# Patient Record
Sex: Female | Born: 1940 | Race: White | Hispanic: No | Marital: Single | Smoking: Former smoker
Health system: Southern US, Community
[De-identification: ages and names within clinical notes are randomized; demographics above are authoritative.]

## PROBLEM LIST (undated history)

## (undated) DIAGNOSIS — G500 Trigeminal neuralgia: Secondary | ICD-10-CM

## (undated) DIAGNOSIS — J45909 Unspecified asthma, uncomplicated: Secondary | ICD-10-CM

## (undated) DIAGNOSIS — E039 Hypothyroidism, unspecified: Secondary | ICD-10-CM

## (undated) DIAGNOSIS — M8580 Other specified disorders of bone density and structure, unspecified site: Secondary | ICD-10-CM

## (undated) DIAGNOSIS — K5790 Diverticulosis of intestine, part unspecified, without perforation or abscess without bleeding: Secondary | ICD-10-CM

## (undated) DIAGNOSIS — D509 Iron deficiency anemia, unspecified: Secondary | ICD-10-CM

## (undated) DIAGNOSIS — Z5189 Encounter for other specified aftercare: Secondary | ICD-10-CM

## (undated) DIAGNOSIS — G4700 Insomnia, unspecified: Secondary | ICD-10-CM

## (undated) DIAGNOSIS — M1990 Unspecified osteoarthritis, unspecified site: Secondary | ICD-10-CM

## (undated) DIAGNOSIS — Z8719 Personal history of other diseases of the digestive system: Secondary | ICD-10-CM

## (undated) DIAGNOSIS — J189 Pneumonia, unspecified organism: Secondary | ICD-10-CM

## (undated) DIAGNOSIS — I10 Essential (primary) hypertension: Secondary | ICD-10-CM

## (undated) DIAGNOSIS — Z86010 Personal history of colonic polyps: Secondary | ICD-10-CM

## (undated) DIAGNOSIS — F988 Other specified behavioral and emotional disorders with onset usually occurring in childhood and adolescence: Secondary | ICD-10-CM

## (undated) DIAGNOSIS — K219 Gastro-esophageal reflux disease without esophagitis: Secondary | ICD-10-CM

## (undated) DIAGNOSIS — F329 Major depressive disorder, single episode, unspecified: Secondary | ICD-10-CM

## (undated) DIAGNOSIS — F32A Depression, unspecified: Secondary | ICD-10-CM

## (undated) HISTORY — DX: Iron deficiency anemia, unspecified: D50.9

## (undated) HISTORY — DX: Unspecified osteoarthritis, unspecified site: M19.90

## (undated) HISTORY — DX: Essential (primary) hypertension: I10

## (undated) HISTORY — DX: Personal history of colonic polyps: Z86.010

## (undated) HISTORY — PX: COLONOSCOPY: SHX174

## (undated) HISTORY — DX: Trigeminal neuralgia: G50.0

## (undated) HISTORY — DX: Personal history of other diseases of the digestive system: Z87.19

## (undated) HISTORY — DX: Encounter for other specified aftercare: Z51.89

## (undated) HISTORY — DX: Other specified behavioral and emotional disorders with onset usually occurring in childhood and adolescence: F98.8

## (undated) HISTORY — DX: Depression, unspecified: F32.A

## (undated) HISTORY — PX: VAGINAL HYSTERECTOMY: SHX2639

## (undated) HISTORY — DX: Major depressive disorder, single episode, unspecified: F32.9

## (undated) HISTORY — DX: Hypothyroidism, unspecified: E03.9

## (undated) HISTORY — PX: ABDOMINAL HYSTERECTOMY: SHX81

## (undated) HISTORY — PX: BILATERAL SALPINGOOPHORECTOMY: SHX1223

## (undated) HISTORY — DX: Other specified disorders of bone density and structure, unspecified site: M85.80

## (undated) HISTORY — DX: Gastro-esophageal reflux disease without esophagitis: K21.9

## (undated) HISTORY — DX: Insomnia, unspecified: G47.00

## (undated) HISTORY — DX: Unspecified asthma, uncomplicated: J45.909

## (undated) HISTORY — PX: UPPER GASTROINTESTINAL ENDOSCOPY: SHX188

## (undated) HISTORY — DX: Diverticulosis of intestine, part unspecified, without perforation or abscess without bleeding: K57.90

---

## 1999-12-11 ENCOUNTER — Encounter: Payer: Self-pay | Admitting: Family Medicine

## 1999-12-11 ENCOUNTER — Encounter: Admission: RE | Admit: 1999-12-11 | Discharge: 1999-12-11 | Payer: Self-pay | Admitting: Family Medicine

## 2000-06-15 ENCOUNTER — Encounter: Admission: RE | Admit: 2000-06-15 | Discharge: 2000-06-15 | Payer: Self-pay | Admitting: Family Medicine

## 2000-06-15 ENCOUNTER — Encounter: Payer: Self-pay | Admitting: Family Medicine

## 2001-04-23 ENCOUNTER — Encounter: Admission: RE | Admit: 2001-04-23 | Discharge: 2001-04-23 | Payer: Self-pay | Admitting: Family Medicine

## 2001-04-23 ENCOUNTER — Encounter: Payer: Self-pay | Admitting: Family Medicine

## 2002-04-25 ENCOUNTER — Encounter: Admission: RE | Admit: 2002-04-25 | Discharge: 2002-04-25 | Payer: Self-pay | Admitting: Family Medicine

## 2002-04-25 ENCOUNTER — Encounter: Payer: Self-pay | Admitting: Family Medicine

## 2003-10-30 ENCOUNTER — Encounter: Payer: Self-pay | Admitting: Internal Medicine

## 2006-04-20 ENCOUNTER — Encounter: Payer: Self-pay | Admitting: Internal Medicine

## 2006-04-21 ENCOUNTER — Encounter: Payer: Self-pay | Admitting: Internal Medicine

## 2006-05-13 ENCOUNTER — Encounter: Payer: Self-pay | Admitting: Internal Medicine

## 2007-04-22 ENCOUNTER — Encounter: Payer: Self-pay | Admitting: Internal Medicine

## 2007-05-04 ENCOUNTER — Encounter: Admission: RE | Admit: 2007-05-04 | Discharge: 2007-05-04 | Payer: Self-pay | Admitting: Family Medicine

## 2007-09-03 ENCOUNTER — Encounter: Payer: Self-pay | Admitting: Internal Medicine

## 2007-09-22 ENCOUNTER — Encounter: Payer: Self-pay | Admitting: Internal Medicine

## 2008-03-07 ENCOUNTER — Encounter: Admission: RE | Admit: 2008-03-07 | Discharge: 2008-03-07 | Payer: Self-pay | Admitting: Family Medicine

## 2008-03-16 ENCOUNTER — Encounter: Payer: Self-pay | Admitting: Internal Medicine

## 2008-03-28 ENCOUNTER — Encounter: Payer: Self-pay | Admitting: Internal Medicine

## 2008-03-30 ENCOUNTER — Encounter: Payer: Self-pay | Admitting: Internal Medicine

## 2008-04-11 ENCOUNTER — Encounter: Payer: Self-pay | Admitting: Internal Medicine

## 2008-05-01 ENCOUNTER — Encounter: Payer: Self-pay | Admitting: Internal Medicine

## 2008-07-11 ENCOUNTER — Encounter: Payer: Self-pay | Admitting: Internal Medicine

## 2008-09-12 ENCOUNTER — Encounter: Payer: Self-pay | Admitting: Internal Medicine

## 2009-01-19 ENCOUNTER — Encounter: Payer: Self-pay | Admitting: Internal Medicine

## 2009-01-22 ENCOUNTER — Encounter: Payer: Self-pay | Admitting: Internal Medicine

## 2009-02-05 ENCOUNTER — Encounter: Payer: Self-pay | Admitting: Internal Medicine

## 2009-02-26 ENCOUNTER — Encounter: Payer: Self-pay | Admitting: Internal Medicine

## 2009-07-11 ENCOUNTER — Encounter: Admission: RE | Admit: 2009-07-11 | Discharge: 2009-07-11 | Payer: Self-pay | Admitting: Orthopedic Surgery

## 2009-07-18 HISTORY — PX: OTHER SURGICAL HISTORY: SHX169

## 2009-10-22 ENCOUNTER — Encounter: Admission: RE | Admit: 2009-10-22 | Discharge: 2009-10-22 | Payer: Self-pay | Admitting: Family Medicine

## 2009-11-06 ENCOUNTER — Encounter: Payer: Self-pay | Admitting: Internal Medicine

## 2009-11-14 ENCOUNTER — Encounter (INDEPENDENT_AMBULATORY_CARE_PROVIDER_SITE_OTHER): Payer: Self-pay | Admitting: *Deleted

## 2009-12-10 ENCOUNTER — Encounter: Payer: Self-pay | Admitting: Internal Medicine

## 2009-12-13 ENCOUNTER — Encounter: Payer: Self-pay | Admitting: Internal Medicine

## 2009-12-17 ENCOUNTER — Ambulatory Visit: Payer: Self-pay | Admitting: Internal Medicine

## 2009-12-17 ENCOUNTER — Encounter (INDEPENDENT_AMBULATORY_CARE_PROVIDER_SITE_OTHER): Payer: Self-pay | Admitting: *Deleted

## 2009-12-17 DIAGNOSIS — D518 Other vitamin B12 deficiency anemias: Secondary | ICD-10-CM | POA: Insufficient documentation

## 2010-04-26 ENCOUNTER — Ambulatory Visit: Payer: Self-pay | Admitting: Internal Medicine

## 2010-04-26 DIAGNOSIS — I10 Essential (primary) hypertension: Secondary | ICD-10-CM | POA: Insufficient documentation

## 2010-04-26 DIAGNOSIS — E039 Hypothyroidism, unspecified: Secondary | ICD-10-CM | POA: Insufficient documentation

## 2010-04-26 DIAGNOSIS — D509 Iron deficiency anemia, unspecified: Secondary | ICD-10-CM | POA: Insufficient documentation

## 2010-04-26 LAB — CONVERTED CEMR LAB
Basophils Relative: 0.8 % (ref 0.0–3.0)
Chloride: 102 meq/L (ref 96–112)
Eosinophils Relative: 2.9 % (ref 0.0–5.0)
GFR calc non Af Amer: 51.65 mL/min (ref >60)
HCT: 37.4 % (ref 36.0–46.0)
Lymphs Abs: 1.4 10*3/uL (ref 0.7–4.0)
MCV: 91.4 fL (ref 78.0–100.0)
Monocytes Absolute: 0.3 10*3/uL (ref 0.1–1.0)
Monocytes Relative: 8.7 % (ref 3.0–12.0)
Potassium: 4.8 meq/L (ref 3.5–5.1)
RBC: 4.09 M/uL (ref 3.87–5.11)
Saturation Ratios: 22.8 % (ref 20.0–50.0)
Sodium: 141 meq/L (ref 135–145)
Transferrin: 307.6 mg/dL (ref 212.0–360.0)
WBC: 3.7 10*3/uL — ABNORMAL LOW (ref 4.5–10.5)

## 2010-04-28 ENCOUNTER — Telehealth: Payer: Self-pay | Admitting: Internal Medicine

## 2010-05-06 ENCOUNTER — Ambulatory Visit: Payer: Self-pay | Admitting: Internal Medicine

## 2010-06-06 ENCOUNTER — Ambulatory Visit: Payer: Self-pay | Admitting: Internal Medicine

## 2010-06-06 DIAGNOSIS — G500 Trigeminal neuralgia: Secondary | ICD-10-CM | POA: Insufficient documentation

## 2010-07-05 ENCOUNTER — Ambulatory Visit: Payer: Self-pay | Admitting: Internal Medicine

## 2010-08-05 ENCOUNTER — Ambulatory Visit: Payer: Self-pay | Admitting: Internal Medicine

## 2010-08-22 ENCOUNTER — Telehealth: Payer: Self-pay | Admitting: Internal Medicine

## 2010-09-05 ENCOUNTER — Ambulatory Visit: Payer: Self-pay | Admitting: Internal Medicine

## 2010-09-05 DIAGNOSIS — G4700 Insomnia, unspecified: Secondary | ICD-10-CM | POA: Insufficient documentation

## 2010-09-18 ENCOUNTER — Ambulatory Visit: Payer: Self-pay | Admitting: Internal Medicine

## 2010-09-18 ENCOUNTER — Telehealth (INDEPENDENT_AMBULATORY_CARE_PROVIDER_SITE_OTHER): Payer: Self-pay | Admitting: *Deleted

## 2010-09-18 DIAGNOSIS — N390 Urinary tract infection, site not specified: Secondary | ICD-10-CM | POA: Insufficient documentation

## 2010-09-18 LAB — CONVERTED CEMR LAB
Bilirubin Urine: NEGATIVE
Ketones, ur: NEGATIVE mg/dL
Nitrite: NEGATIVE
Specific Gravity, Urine: >=1.03 (ref 1.000–1.030)
pH: 5 (ref 5.0–8.0)

## 2010-10-30 ENCOUNTER — Telehealth (INDEPENDENT_AMBULATORY_CARE_PROVIDER_SITE_OTHER): Payer: Self-pay | Admitting: *Deleted

## 2010-12-08 ENCOUNTER — Encounter: Payer: Self-pay | Admitting: Family Medicine

## 2010-12-17 NOTE — Assessment & Plan Note (Signed)
Summary: UPPER GI BLEED--[facility]   History of Present Illness Visit Type: consult  Primary GI MD: Stan Head MD [facility] Primary Provider: Carolyne Fiscal, MD Requesting Provider: Carolyne Fiscal, MD Chief Complaint: Upper GI bleed  History of Present Illness:   This is a 70 year old white woman with to me from prior colonoscopy. I am asked to see her again regarding suspect is recurrent upper GI bleeding. She has been using ibuprofen intermittently for aches and pains and a myositis. Her primary care physician has noted that her hemoglobin would drop to 10.7 in the last few weeks, treatment with iron and avoiding end-stage and aspirin as allowed normalization of the hemoglobin to the 11 range. She will use omeprazole 20 mg b.i.d. p.r.n. as well. she had been living in [place], [place] a few years ago, and she was admitted with what sounds like an upper GI bleed in 2007. She was using a lot of ibuprofen and aspirin at that time, she reports that she was told she had a very eructating stomach that looked like a With scratching it. She says her hemoglobin was 3 or 4 at that time and there was a large hiatal hernia.  Colonoscopy 10/2003 diverticula and external hemrrhoids. She also had a colonoscopy in 2007 [place], [place]) - results not known.  [facility] [place] records, colonoscopy report, Dr. Geoffery Lyons records and labs from 2010 and 2011 reviewed.     GI Review of Systems      Denies abdominal pain, acid reflux, belching, bloating, chest pain, dysphagia with liquids, dysphagia with solids, heartburn, loss of appetite, nausea, vomiting, vomiting blood, weight loss, and  weight gain.        Denies anal fissure, black tarry stools, change in bowel habit, constipation, diarrhea, diverticulosis, fecal incontinence, heme positive stool, hemorrhoids, irritable bowel syndrome, jaundice, light color stool, liver problems, rectal bleeding, and  rectal pain.    Current Medications (verified): 1)   Lisinopril 10 Mg Tabs (Lisinopril) .... Take 1 Tablet By Mouth Once A Day in The Morning 2)  Maxzide-25 37.5-25 Mg Tabs (Triamterene-Hctz) .... Take 1 Tablet By Mouth Once A Day 3)  Synthroid 88 Mcg Tabs (Levothyroxine Sodium) .... Take 1 Tablet By Mouth Once A Day 4)  Ferrous Sulfate 325 (65 Fe) Mg Tabs (Ferrous Sulfate) .... Take 1 Tablet By Mouth Two Times A Day 5)  Calcium Carbonate 600 Mg Tabs (Calcium Carbonate) .... Take 2 Tablets Daily 6)  Vitamin D 2000 Unit Tabs (Cholecalciferol) .... Take 1 Tablet By Mouth Once A Day  Allergies (verified): 1)  Amlodipine Besylate (Amlodipine Besylate) 2)  Protonix (Pantoprazole Sodium) 3)  Wellbutrin 4)  Ace Inhibitors 5)  Levaquin  Past History:  Past Medical History: ADD Hypertension Hypothyroidism Anemia-Iron Deficiency - associaed with NSAID's Depression GERD Degenerative Disk Disease/Spinal Stenosis Osteopenia Diverticulosis Asthma  Past Surgical History: Hysterectomy-(LVH) BSO Right knee menscectomy 07/2009  Family History: Family History of Heart Disease: Father deceased age 43-MI No FH of Colon Cancer:  Social History: Divorced, 2 girls [employer] Patient is a former smoker. Quit in 1999 Alcohol Use - yes: 3 weekly  Daily Caffeine Use Illicit Drug Use - no Drug Use:  no  Review of Systems       + joint pains, arthritis All other ROS negative except as per HPI.   Vital Signs:  Patient profile:   70 year old female Height:      63 inches Weight:      149 pounds BMI:  26.49 BSA:     1.71 Pulse rate:   68 / minute Pulse rhythm:   regular BP sitting:   128 / 76  (left arm) Cuff size:   regular  Vitals Entered By: Ok Anis CMA (December 17, 2009 10:05 AM)  Physical Exam  General:  Well developed, well nourished, no acute distress. Eyes:  PERRLA, no icterus. Mouth:  No deformity or lesions, dentition normal. Neck:  Supple; no masses or thyromegaly. Lungs:  Clear throughout to  auscultation. Heart:  Regular rate and rhythm; no murmurs, rubs,  or bruits. Abdomen:  Soft, nontender and nondistended. No masses, hepatosplenomegaly or hernias noted. Normal bowel sounds. Extremities:  No clubbing, cyanosis, edema or deformities noted. Neurologic:  Alert and  oriented x4;   Cervical Nodes:  No significant cervical or supraclavicular adenopathy.  Psych:  Alert and cooperative. Normal to mildy manic mood and affect.   Impression & Recommendations:  Problem # 1:  GI BLEEDING (ICD-578.9) Assessment New Sounds like recurent low-grade GI bleeding due to NSAID's. Hgb normal now off NSAID's EGD to evaluate current status appropriate. Could need a colonoscopy but says had one 2007 in [place]. (2004 here external hemorrhoids and diverticuosis) Will try to obtain those records Risks, benefits,and indications of endoscopic procedure(s) were reviewed with the patient and all questions answered.  Other Orders: EGD (EGD)  Patient Instructions: 1)  Copy sent to : Mosetta Putt, MD 2)  EGD instructions provided to patient and she was asked to provide information re: which hospital in [place], [place] was she admitted to 2007?  Appended Document: UPPER GI BLEED--[facility] EGD, colonoscopy and path reports from 2007 reviewd. She cancelled EGD and should reschedule. o notthink colonoscopy needed right now would do EGD first  Appended Document: UPPER GI BLEED--[facility] I spoke with the patient this am , she is advised that Dr Leone Payor is requesting she reschedule her EGD she canceled.  Patient declined to scheudle with me this, but did say she will check her work schedule and call back

## 2010-12-17 NOTE — Letter (Signed)
Summary: EGD Instructions  [facility]  [address] [place], [place] [address]   Phone: [phone]  Fax: [phone]       [facility]    1941/05/31    MRN: [id number]       Procedure Day /Date: Tuesday, 01/08/10     Arrival Time: 2:00pm     Procedure Time: 3:00 pm     Location of Procedure:                    _X_ [facility] (4th Floor)  PREPARATION FOR ENDOSCOPY   On Tuesday, 01/08/10 THE DAY OF THE PROCEDURE:  1.   No solid foods, milk or milk products are allowed after midnight the night before your procedure.  2.   Do not drink anything colored red or purple.  Avoid juices with pulp.  No orange juice.  3.  You may drink clear liquids until 12:00 pm, which is 2 hours before your procedure.                                                                                                CLEAR LIQUIDS INCLUDE: Water Jello Ice Popsicles Tea (sugar ok, no milk/cream) Powdered fruit flavored drinks Coffee (sugar ok, no milk/cream) Gatorade Juice: apple, white grape, white cranberry  Lemonade Clear bullion, consomm, broth Carbonated beverages (any kind) Strained chicken noodle soup Hard Candy   MEDICATION INSTRUCTIONS  Unless otherwise instructed, you should take regular prescription medications with a small sip of water as early as possible the morning of your procedure.  Additional medication instructions: None                OTHER INSTRUCTIONS  You will need a responsible adult at least 68 years of age to accompany you and drive you home.   This person must remain in the waiting room during your procedure.  Wear loose fitting clothing that is easily removed.  Leave jewelry and other valuables at home.  However, you may wish to bring a book to read or an iPod/MP3 player to listen to music as you wait for your procedure to start.  Remove all body piercing jewelry and leave at home.  Total time from sign-in until discharge  is approximately 2-3 hours.  You should go home directly after your procedure and rest.  You can resume normal activities the day after your procedure.  The day of your procedure you should not:   Drive   Make legal decisions   Operate machinery   Drink alcohol   Return to work  You will receive specific instructions about eating, activities and medications before you leave.    The above instructions have been reviewed and explained to me by   _______________________    I fully understand and can verbalize these instructions _____________________________ Date _________

## 2010-12-17 NOTE — Assessment & Plan Note (Signed)
Summary: bp follow up/b12/ and med refill   Vital Signs:  Patient profile:   70 year old female Menstrual status:  hysterectomy Height:      63 inches Weight:      149 pounds BMI:     26.49 O2 Sat:      97 % on Room air Temp:     98.5 degrees F oral Pulse rate:   81 / minute Pulse rhythm:   regular Resp:     16 per minute BP sitting:   112 / 72  (left arm) Cuff size:   regular  Vitals Entered By: Rock Nephew CMA (September 05, 2010 1:56 PM)  Nutrition Counseling: Patient's BMI is greater than 25 and therefore counseled on weight management options.  O2 Flow:  Room air CC: follow-up visit, Insomnia, Hypertension Management Is Patient Diabetic? No Pain Assessment Patient in pain? no       Does patient need assistance? Functional Status Self care Ambulation Normal     Menstrual Status hysterectomy Last PAP Result Normal   Primary Care Provider:  Etta Grandchild MD  CC:  follow-up visit, Insomnia, and Hypertension Management.  History of Present Illness:  Insomnia      This is a 70 year old woman who presents with Insomnia.  The symptoms began 4-8 weeks ago.  The severity is described as mild.  The patient reports difficulty falling asleep and frequent awakening, but denies early awakening, nightmares, leg movements, snoring, apnea noted by partner, and daytime somnolence.  Insomnia has led to problems with impaired judgement.  Risk factors for insomnia include irregular work schedule.  Treatments tried in the past and found to be ineffective incude behavior modification, hypnosis, and relaxation techniques.    Also, she requests a refill on meds for ADHD.  Hypertension History:      She denies headache, chest pain, palpitations, dyspnea with exertion, orthopnea, PND, peripheral edema, visual symptoms, neurologic problems, syncope, and side effects from treatment.  She notes no problems with any antihypertensive medication side effects.        Positive major  cardiovascular risk factors include female age 68 years old or older and hypertension.  Negative major cardiovascular risk factors include no history of diabetes or hyperlipidemia and non-tobacco-user status.        Further assessment for target organ damage reveals no history of ASHD, cardiac end-organ damage (CHF/LVH), stroke/TIA, peripheral vascular disease, renal insufficiency, or hypertensive retinopathy.     Preventive Screening-Counseling & Management  Alcohol-Tobacco     Alcohol drinks/day: <1     Alcohol type: wine     >5/day in last 3 mos: no     Alcohol Counseling: not indicated; use of alcohol is not excessive or problematic     Feels need to cut down: no     Feels annoyed by complaints: no     Feels guilty re: drinking: yes     Needs 'eye opener' in am: no     Smoking Status: quit > 6 months     Smoking Cessation Counseling: yes     Tobacco Counseling: to remain off tobacco products  Hep-HIV-STD-Contraception     Hepatitis Risk: no risk noted     HIV Risk: no risk noted     STD Risk: no risk noted     SBE monthly: no     SBE Education/Counseling: to perform regular SBE  Allergies (verified): 1)  Amlodipine Besylate (Amlodipine Besylate) 2)  Protonix (Pantoprazole Sodium) 3)  Wellbutrin  4)  Ace Inhibitors 5)  Levaquin  Past History:  Past Medical History: Last updated: 04/26/2010 ADD Hypertension Hypothyroidism Anemia-Iron Deficiency - associaed with NSAID's Depression GERD Degenerative Disk Disease/Spinal Stenosis Osteopenia Diverticulosis Asthma  Past Surgical History: Last updated: 12/17/2009 Hysterectomy-(LVH) BSO Right knee menscectomy 07/2009  Family History: Last updated: 12/17/2009 Family History of Heart Disease: Father deceased age 66-MI No FH of Colon Cancer:  Social History: Last updated: 12/17/2009 Divorced, 2 girls [employer] Patient is a former smoker. Quit in 1999 Alcohol Use - yes: 3 weekly  Daily Caffeine Use Illicit Drug  Use - no  Risk Factors: Alcohol Use: <1 (09/05/2010) >5 drinks/d w/in last 3 months: no (09/05/2010) Exercise: yes (04/26/2010)  Risk Factors: Smoking Status: quit > 6 months (09/05/2010)  Family History: Reviewed history from 12/17/2009 and no changes required. Family History of Heart Disease: Father deceased age 66-MI No FH of Colon Cancer:  Social History: Reviewed history from 12/17/2009 and no changes required. Divorced, 2 girls [employer] Patient is a former smoker. Quit in 1999 Alcohol Use - yes: 3 weekly  Daily Caffeine Use Illicit Drug Use - no  Review of Systems General:  Denies chills, fatigue, fever, loss of appetite, malaise, and sweats. Psych:  Denies anxiety, depression, easily angered, easily tearful, irritability, mental problems, panic attacks, sense of great danger, suicidal thoughts/plans, thoughts of violence, unusual visions or sounds, and thoughts /plans of harming others. Endo:  Denies cold intolerance, excessive hunger, excessive thirst, excessive urination, heat intolerance, polyuria, and weight change. Heme:  Denies abnormal bruising, bleeding, enlarge lymph nodes, fevers, pallor, and skin discoloration.  Physical Exam  General:  Well developed, well nourished, no acute distress. Mouth:  No deformity or lesions, dentition normal. Neck:  Supple; no masses or thyromegaly. Lungs:  normal respiratory effort, no intercostal retractions, no accessory muscle use, normal breath sounds, no dullness, no fremitus, no crackles, and no wheezes.   Heart:  normal rate, regular rhythm, no murmur, no gallop, no rub, and no JVD.   Abdomen:  soft, non-tender, normal bowel sounds, no distention, no masses, no guarding, no rigidity, no rebound tenderness, no abdominal hernia, no inguinal hernia, no hepatomegaly, and no splenomegaly.   Msk:  normal ROM, no joint tenderness, no joint swelling, no joint warmth, no redness over joints, no joint deformities, no joint  instability, and no crepitation.   Pulses:  R and L carotid,radial,femoral,dorsalis pedis and posterior tibial pulses are full and equal bilaterally Extremities:  No clubbing, cyanosis, edema, or deformity noted with normal full range of motion of all joints.   Neurologic:  No cranial nerve deficits noted. Station and gait are normal. Plantar reflexes are down-going bilaterally. DTRs are symmetrical throughout. Sensory, motor and coordinative functions appear intact. Skin:  turgor normal, color normal, no rashes, no suspicious lesions, no ecchymoses, no petechiae, and no purpura.   Psych:  Cognition and judgment appear intact. Alert and cooperative with normal attention span and concentration. No apparent delusions, illusions, hallucinations   Impression & Recommendations:  Problem # 1:  INSOMNIA-SLEEP DISORDER-UNSPEC (ICD-780.52) Assessment New  Her updated medication list for this problem includes:    Ambien 10 Mg Tabs (Zolpidem tartrate) ..... One by mouth at bedtime as needed for insomnia  Problem # 2:  HYPERTENSION (ICD-401.9) Assessment: Improved  Her updated medication list for this problem includes:    Losartan Potassium-hctz 100-25 Mg Tabs (Losartan potassium-hctz) ..... One by mouth once daily for high blood pressure  BP today: 112/72 Prior BP: 96/64 (06/06/2010)  Prior  10 Yr Risk Heart Disease: Not enough information (06/06/2010)  Labs Reviewed: K+: 4.8 (04/26/2010) Creat: : 1.1 (04/26/2010)     Problem # 3:  HYPOTHYROIDISM (ICD-244.9) Assessment: Unchanged  Her updated medication list for this problem includes:    Synthroid 88 Mcg Tabs (Levothyroxine sodium) .Marland Kitchen... Take 1 tablet by mouth once a day  Labs Reviewed: TSH: 1.72 (04/26/2010)     Complete Medication List: 1)  Synthroid 88 Mcg Tabs (Levothyroxine sodium) .... Take 1 tablet by mouth once a day 2)  Ferrous Sulfate 325 (65 Fe) Mg Tabs (Ferrous sulfate) .... Take 1 tablet by mouth two times a day 3)   Calcium Carbonate 600 Mg Tabs (Calcium carbonate) .... Take 2 tablets daily 4)  Vitamin D 2000 Unit Tabs (Cholecalciferol) .... Take 1 tablet by mouth once a day 5)  Adderall 20 Mg Tabs (Amphetamine-dextroamphetamine) .... Take 1 tablet by mouth two times a day. fill on or after 10/2710 6)  Amoxicillin 500 Mg Caps (Amoxicillin) .... Three times a day until completed 7)  Lyrica 75 Mg Caps (Pregabalin) .... One by mouth three times a day as needed for facial pain 8)  Losartan Potassium-hctz 100-25 Mg Tabs (Losartan potassium-hctz) .... One by mouth once daily for high blood pressure 9)  Ambien 10 Mg Tabs (Zolpidem tartrate) .... One by mouth at bedtime as needed for insomnia  Other Orders: Admin of Therapeutic Inj  intramuscular or subcutaneous (16109) Vit B12 1000 mcg (U0454)  Hypertension Assessment/Plan:      The patient's hypertensive risk group is category B: At least one risk factor (excluding diabetes) with no target organ damage.  Today's blood pressure is 112/72.  Her blood pressure goal is < 140/90.  Patient Instructions: 1)  Please schedule a follow-up appointment in 4 months. 2)  It is important that you exercise regularly at least 20 minutes 5 times a week. If you develop chest pain, have severe difficulty breathing, or feel very tired , stop exercising immediately and seek medical attention. 3)  You need to lose weight. Consider a lower calorie diet and regular exercise.  4)  Check your Blood Pressure regularly. If it is above 130/80: you should make an appointment. Prescriptions: ADDERALL 20 MG TABS (AMPHETAMINE-DEXTROAMPHETAMINE) Take 1 tablet by mouth two times a day. Fill on or after 10/2710  #60 x 0   Entered by:   Rock Nephew CMA   Authorized by:   Etta Grandchild MD   Signed by:   Rock Nephew CMA on 09/05/2010   Method used:   Print then Give to Patient   RxID:   [id number] ADDERALL 20 MG TABS (AMPHETAMINE-DEXTROAMPHETAMINE) Take 1 tablet by mouth two times a  day. Fill on or after 09/2710  #60 x 0   Entered by:   Rock Nephew CMA   Authorized by:   Etta Grandchild MD   Signed by:   Rock Nephew CMA on 09/05/2010   Method used:   Print then Give to Patient   RxID:   [id number] ADDERALL 20 MG TABS (AMPHETAMINE-DEXTROAMPHETAMINE) Take 1 tablet by mouth two times a day. Fill on or after 08/2710  #60 x 0   Entered by:   Rock Nephew CMA   Authorized by:   Etta Grandchild MD   Signed by:   Rock Nephew CMA on 09/05/2010   Method used:   Print then Give to Patient   RxID:   [id number] AMBIEN 10 MG TABS (ZOLPIDEM TARTRATE) One by mouth at bedtime  as needed for insomnia  #30 x 5   Entered and Authorized by:   Etta Grandchild MD   Signed by:   Etta Grandchild MD on 09/05/2010   Method used:   Print then Give to Patient   RxID:   [id number] ADDERALL 20 MG TABS (AMPHETAMINE-DEXTROAMPHETAMINE) Take 1 tablet by mouth two times a day. Fill on or after 07/2710  #60 x 0   Entered and Authorized by:   Etta Grandchild MD   Signed by:   Etta Grandchild MD on 09/05/2010   Method used:   Print then Give to Patient   RxID:   [id number]    Medication Administration  Injection # 1:    Medication: Vit B12 1000 mcg    Diagnosis: ANEMIA, B12 DEFICIENCY (ICD-281.1)    Route: IM    Site: L deltoid    Exp Date: 06/17/2012    Lot #: [id number]    Mfr: [employer]    Patient tolerated injection without complications  Orders Added: 1)  Admin of Therapeutic Inj  intramuscular or subcutaneous [96372] 2)  Vit B12 1000 mcg [J3420] 3)  Est. Patient Level IV [25366]

## 2010-12-17 NOTE — Procedures (Signed)
Summary: Colonoscopy: Hemorrhoids, Diverticulosis   Colonoscopy  Procedure date:  10/30/2003  Findings:      Results: Hemorrhoids.     Results: Diverticulosis.       Location:  [facility].   Patient Name: Valerie, Davis MRN:  Procedure Procedures: Colonoscopy CPT: [phone].  Personnel: Endoscopist: Iva Boop, MD, [facility].  Referred By: Mosetta Putt, M.D.  Exam Location: Exam performed in Outpatient Clinic. Outpatient  Patient Consent: Procedure, Alternatives, Risks and Benefits discussed, consent obtained, from patient. Consent was obtained by the RN.  Indications  Average Risk Screening Routine.  History  Current Medications: Patient is not currently taking Coumadin.  Pre-Exam Physical: Performed Oct 30, 2003. Cardio-pulmonary exam, Rectal exam, HEENT exam , Abdominal exam, Mental status exam WNL.  Exam Exam: Extent of exam reached: Cecum, extent intended: Cecum.  The cecum was identified by appendiceal orifice and IC valve. Patient position: on left side. Colon retroflexion performed. Images taken. ASA Classification: II. Tolerance: good.  Monitoring: Pulse and BP monitoring, Oximetry used. Supplemental O2 given.  Colon Prep Used Visicol for colon prep. Prep results: good.  Sedation Meds: Patient assessed and found to be appropriate for moderate (conscious) sedation. Sedation was managed by the Endoscopist. Fentanyl 75 mcg. given IV. Versed 7 mg. given IV.  Findings - NORMAL EXAM: Cecum to Descending Colon.  DIVERTICULOSIS: Sigmoid Colon. Not bleeding. ICD9: Diverticulosis, Colon: 562.10. Comments: moderate.  HEMORRHOIDS: External. Size: Grade I. Not bleeding. ICD9: Hemorrhoids, External: 455.3.   Assessment Abnormal examination, see findings above.  Diagnoses: 562.10: Diverticulosis, Colon.  455.3: Hemorrhoids, External.   Comments: No polyps or cancer seen. Events  Unplanned Interventions: No intervention was required.   Plans Patient Education: Patient given standard instructions for: Diverticulosis. Hemorrhoids.  Disposition: After procedure patient sent to recovery. After recovery patient sent home.  Scheduling/Referral: Primary Care Provider, to Mosetta Putt, M.D., as planned and to coordinate future colon cancer screening.,   CC:   Mosetta Putt, MD  This report was created from the original endoscopy report, which was reviewed and signed by the above listed endoscopist.

## 2010-12-17 NOTE — Assessment & Plan Note (Signed)
Summary: NEW / [employer] (PRIMARY) MEDICARE (SEC)/ OK'D BY DR JONES/NWS   Vital Signs:  Patient profile:   70 year old female Height:      63 inches Weight:      148 pounds O2 Sat:      97 % on Room air Temp:     97.4 degrees F oral Pulse rate:   80 / minute Pulse rhythm:   regular Resp:     16 per minute BP sitting:   128 / 82  (left arm)  Vitals Entered By: Rock Nephew CMA (April 26, 2010 11:38 AM)  O2 Flow:  Room air  Primary Care Provider:  Etta Grandchild MD   History of Present Illness:  Follow-Up Visit      This is a 70 year old woman who presents for Follow-up visit.  The patient denies chest pain, palpitations, dizziness, syncope, edema, SOB, DOE, PND, and orthopnea.  Since the last visit the patient notes problems with medications.  The patient reports taking meds as prescribed, monitoring BP, and dietary compliance.  When questioned about possible medication side effects, the patient notes dry cough.    Preventive Screening-Counseling & Management  Alcohol-Tobacco     Alcohol drinks/day: <1     Alcohol type: wine     >5/day in last 3 mos: no     Alcohol Counseling: not indicated; use of alcohol is not excessive or problematic     Feels need to cut down: no     Feels annoyed by complaints: no     Feels guilty re: drinking: yes     Needs 'eye opener' in am: no     Smoking Status: never  Caffeine-Diet-Exercise     Does Patient Exercise: yes     Type of exercise: swimming     Exercise (avg: min/session): <30     Times/week: 4     Exercise Counseling: to improve exercise regimen     PHQ-9 Score: 1-4 minimal depression  Hep-HIV-STD-Contraception     Hepatitis Risk: no risk noted     HIV Risk: no risk noted     STD Risk: no risk noted     SBE monthly: no     SBE Education/Counseling: to perform regular SBE  Safety-Violence-Falls     Seat Belt Use: yes     Helmet Use: yes     Firearms in the Home: no firearms in the home     Smoke Detectors: yes     Violence  in the Home: no risk noted     Sexual Abuse: no      Sexual History:  currently monogamous.        Drug Use:  never.        Blood Transfusions:  no.    Medications Prior to Update: 1)  Lisinopril 10 Mg Tabs (Lisinopril) .... Take 1 Tablet By Mouth Once A Day in The Morning 2)  Maxzide-25 37.5-25 Mg Tabs (Triamterene-Hctz) .... Take 1 Tablet By Mouth Once A Day 3)  Synthroid 88 Mcg Tabs (Levothyroxine Sodium) .... Take 1 Tablet By Mouth Once A Day 4)  Ferrous Sulfate 325 (65 Fe) Mg Tabs (Ferrous Sulfate) .... Take 1 Tablet By Mouth Two Times A Day 5)  Calcium Carbonate 600 Mg Tabs (Calcium Carbonate) .... Take 2 Tablets Daily 6)  Vitamin D 2000 Unit Tabs (Cholecalciferol) .... Take 1 Tablet By Mouth Once A Day  Current Medications (verified): 1)  Synthroid 88 Mcg Tabs (Levothyroxine Sodium) .... Take  1 Tablet By Mouth Once A Day 2)  Ferrous Sulfate 325 (65 Fe) Mg Tabs (Ferrous Sulfate) .... Take 1 Tablet By Mouth Two Times A Day 3)  Calcium Carbonate 600 Mg Tabs (Calcium Carbonate) .... Take 2 Tablets Daily 4)  Vitamin D 2000 Unit Tabs (Cholecalciferol) .... Take 1 Tablet By Mouth Once A Day 5)  Diovan Hct 160-25 Mg Tabs (Valsartan-Hydrochlorothiazide) .... One By Mouth Once Daily For High Blood Pressure  Allergies (verified): 1)  Amlodipine Besylate (Amlodipine Besylate) 2)  Protonix (Pantoprazole Sodium) 3)  Wellbutrin 4)  Ace Inhibitors 5)  Levaquin  Past History:  Past Medical History: ADD Hypertension Hypothyroidism Anemia-Iron Deficiency - associaed with NSAID's Depression GERD Degenerative Disk Disease/Spinal Stenosis Osteopenia Diverticulosis Asthma  Past Surgical History: Reviewed history from 12/17/2009 and no changes required. Hysterectomy-(LVH) BSO Right knee menscectomy 07/2009  Family History: Reviewed history from 12/17/2009 and no changes required. Family History of Heart Disease: Father deceased age 76-MI No FH of Colon Cancer:  Social  History: Reviewed history from 12/17/2009 and no changes required. Divorced, 2 girls [employer] Patient is a former smoker. Quit in 1999 Alcohol Use - yes: 3 weekly  Daily Caffeine Use Illicit Drug Use - no Smoking Status:  never Does Patient Exercise:  yes Hepatitis Risk:  no risk noted HIV Risk:  no risk noted STD Risk:  no risk noted Sexual History:  currently monogamous Drug Use:  never Blood Transfusions:  no Seat Belt Use:  yes  Review of Systems       The patient complains of prolonged cough.  The patient denies anorexia, fever, weight loss, weight gain, chest pain, syncope, dyspnea on exertion, peripheral edema, headaches, hemoptysis, abdominal pain, melena, hematochezia, severe indigestion/heartburn, hematuria, difficulty walking, depression, enlarged lymph nodes, and breast masses.   Endo:  Denies cold intolerance, excessive hunger, excessive thirst, excessive urination, heat intolerance, polyuria, and weight change. Heme:  Denies abnormal bruising, bleeding, enlarge lymph nodes, fevers, pallor, and skin discoloration.  Physical Exam  General:  Well developed, well nourished, no acute distress. Eyes:  PERRLA, no icterus. Mouth:  No deformity or lesions, dentition normal. Neck:  Supple; no masses or thyromegaly. Lungs:  normal respiratory effort, no intercostal retractions, no accessory muscle use, normal breath sounds, no dullness, no fremitus, no crackles, and no wheezes.   Heart:  normal rate, regular rhythm, no murmur, no gallop, no rub, and no JVD.   Abdomen:  soft, non-tender, normal bowel sounds, no distention, no masses, no guarding, no rigidity, no rebound tenderness, no abdominal hernia, no inguinal hernia, no hepatomegaly, and no splenomegaly.   Msk:  normal ROM, no joint tenderness, no joint swelling, no joint warmth, no redness over joints, no joint deformities, and no joint instability.   Pulses:  R and L carotid,radial,femoral,dorsalis pedis and posterior  tibial pulses are full and equal bilaterally Extremities:  No clubbing, cyanosis, edema, or deformity noted with normal full range of motion of all joints.   Neurologic:  No cranial nerve deficits noted. Station and gait are normal. Plantar reflexes are down-going bilaterally. DTRs are symmetrical throughout. Sensory, motor and coordinative functions appear intact. Skin:  turgor normal, color normal, no rashes, no suspicious lesions, no ecchymoses, no petechiae, no purpura, no ulcerations, and no edema.   Cervical Nodes:  no anterior cervical adenopathy and no posterior cervical adenopathy.   Axillary Nodes:  no R axillary adenopathy and no L axillary adenopathy.   Inguinal Nodes:  no R inguinal adenopathy and no L inguinal adenopathy.  Psych:  Cognition and judgment appear intact. Alert and cooperative with normal attention span and concentration. No apparent delusions, illusions, hallucinations   Impression & Recommendations:  Problem # 1:  HYPERTENSION (ICD-401.9) Assessment Unchanged  The following medications were removed from the medication list:    Lisinopril 10 Mg Tabs (Lisinopril) .Marland Kitchen... Take 1 tablet by mouth once a day in the morning    Maxzide-25 37.5-25 Mg Tabs (Triamterene-hctz) .Marland Kitchen... Take 1 tablet by mouth once a day Her updated medication list for this problem includes:    Diovan Hct 160-25 Mg Tabs (Valsartan-hydrochlorothiazide) ..... One by mouth once daily for high blood pressure  Orders: Venipuncture (30160) TLB-B12 + Folate Pnl (10932_35573-U20/URK) TLB-IBC Pnl (Iron/FE;Transferrin) (83550-IBC) TLB-BMP (Basic Metabolic Panel-BMET) (80048-METABOL) TLB-CBC Platelet - w/Differential (85025-CBCD) TLB-TSH (Thyroid Stimulating Hormone) (84443-TSH)  BP today: 128/82 Prior BP: 128/76 (12/17/2009)  Problem # 2:  HYPOTHYROIDISM (ICD-244.9) Assessment: Unchanged  Her updated medication list for this problem includes:    Synthroid 88 Mcg Tabs (Levothyroxine sodium) .Marland Kitchen...  Take 1 tablet by mouth once a day  Orders: Venipuncture (27062) TLB-B12 + Folate Pnl (37628_31517-O16/WVP) TLB-IBC Pnl (Iron/FE;Transferrin) (83550-IBC) TLB-BMP (Basic Metabolic Panel-BMET) (80048-METABOL) TLB-CBC Platelet - w/Differential (85025-CBCD) TLB-TSH (Thyroid Stimulating Hormone) (84443-TSH)  Problem # 3:  ANEMIA-IRON DEFICIENCY (ICD-280.9) Assessment: Unchanged  Her updated medication list for this problem includes:    Ferrous Sulfate 325 (65 Fe) Mg Tabs (Ferrous sulfate) .Marland Kitchen... Take 1 tablet by mouth two times a day  Orders: Venipuncture (71062) TLB-B12 + Folate Pnl (69485_46270-J50/KXF) TLB-IBC Pnl (Iron/FE;Transferrin) (83550-IBC) TLB-BMP (Basic Metabolic Panel-BMET) (80048-METABOL) TLB-CBC Platelet - w/Differential (85025-CBCD) TLB-TSH (Thyroid Stimulating Hormone) (84443-TSH)  Complete Medication List: 1)  Synthroid 88 Mcg Tabs (Levothyroxine sodium) .... Take 1 tablet by mouth once a day 2)  Ferrous Sulfate 325 (65 Fe) Mg Tabs (Ferrous sulfate) .... Take 1 tablet by mouth two times a day 3)  Calcium Carbonate 600 Mg Tabs (Calcium carbonate) .... Take 2 tablets daily 4)  Vitamin D 2000 Unit Tabs (Cholecalciferol) .... Take 1 tablet by mouth once a day 5)  Diovan Hct 160-25 Mg Tabs (Valsartan-hydrochlorothiazide) .... One by mouth once daily for high blood pressure  Other Orders: Radiology Referral (Radiology)  Colorectal Screening:  Current Recommendations:    Colonoscopy recommended: patient defers today but will consider in the future  PAP Screening:    Hx Cervical Dysplasia in last 5 yrs? No    3 normal PAP smears in last 5 yrs? Yes    Last PAP smear:  05/02/2008    Reviewed PAP smear recommendations:  patient refuses understanding risks of delayed diagnosis  PAP Smear Results:    Date of Exam:  05/02/2008    Results:  Normal  Mammogram Screening:    Reviewed Mammogram recommendations:  mammogram ordered  Osteoporosis Risk Assessment:  Risk  Factors for Fracture or Low Bone Density:   Race (White or Asian):     yes   Hx of Fractures:       no   FH of Osteoporosis:     yes   Hx of falls:       no   Physically inactive:     no   Smoking status:       never   High alcohol use:     no   High caffeine use:     no   Low calcium/Vit. D intake:   no   Corticosteroid use:     no   Thyroid replacement:  yes   Dilantin use:       no   Heparin use:       no   Thyroid disease:     yes   Rheumatoid Arthritis:     no   Parathyroid disease:     no  Bone Density (DEXA Scan) Results:    Date of Exam:  02/27/2009    Results:  Osteopenia  Immunization & Chemoprophylaxis:    Varicella vaccine: Varicella  (09/07/2006)    Tetanus vaccine: Tdap  (04/26/2009)    Pneumovax: Pneumovax  (06/30/2008)  Patient Instructions: 1)  Please schedule a follow-up appointment in 4 months. 2)  It is important that you exercise regularly at least 20 minutes 5 times a week. If you develop chest pain, have severe difficulty breathing, or feel very tired , stop exercising immediately and seek medical attention. 3)  You need to lose weight. Consider a lower calorie diet and regular exercise.  4)  Schedule your mammogram. 5)  Check your Blood Pressure regularly. If it is above 130/80: you should make an appointment. Prescriptions: DIOVAN HCT 160-25 MG TABS (VALSARTAN-HYDROCHLOROTHIAZIDE) One by mouth once daily for high blood pressure  #30 x 11   Entered and Authorized by:   Etta Grandchild MD   Signed by:   Etta Grandchild MD on 04/26/2010   Method used:   Print then Mail to Patient   RxID:   [id number] DIOVAN HCT 160-25 MG TABS (VALSARTAN-HYDROCHLOROTHIAZIDE) One by mouth once daily for high blood pressure  #56 x 0   Entered and Authorized by:   Etta Grandchild MD   Signed by:   Etta Grandchild MD on 04/26/2010   Method used:   Telephoned to ...         RxID:   [id number]      Varicella Immunization History:    Varicella # 1:   Varicella (09/07/2006)  Tetanus/Td Immunization History:    Tetanus/Td # 1:  Tdap (04/26/2009)  Pneumovax Immunization History:    Pneumovax # 1:  Pneumovax (06/30/2008)

## 2010-12-17 NOTE — Procedures (Signed)
Summary: EGD: hiatal hernia, erosive gastritis, neg H pylori ([place])  EGD/[facility]   Imported By: Lester Larimer 01/14/2010 10:26:29  _____________________________________________________________________  External Attachment:    Type:   Image     Comment:   External Document

## 2010-12-17 NOTE — Letter (Signed)
Summary: Referral Letter/[facility]  Referral Letter/[facility]   Imported By: Sherian Rein 01/09/2010 10:15:30  _____________________________________________________________________  External Attachment:    Type:   Image     Comment:   External Document

## 2010-12-17 NOTE — Letter (Signed)
Summary: [facility]  [facility]   Imported By: Sherian Rein 01/02/2010 07:56:17  _____________________________________________________________________  External Attachment:    Type:   Image     Comment:   External Document

## 2010-12-17 NOTE — Assessment & Plan Note (Signed)
Summary: b12 shot/cd  Nurse Visit   Allergies: 1)  Amlodipine Besylate (Amlodipine Besylate) 2)  Protonix (Pantoprazole Sodium) 3)  Wellbutrin 4)  Ace Inhibitors 5)  Levaquin  Medication Administration  Injection # 1:    Medication: Vit B12 1000 mcg    Diagnosis: ANEMIA, B12 DEFICIENCY (ICD-281.1)    Route: IM    Site: L deltoid    Exp Date: 02/16/2012    Lot #: [id number]    Mfr: [employer] Regent    Patient tolerated injection without complications    Given by: Lamar Sprinkles, CMA (July 05, 2010 9:38 AM)  Orders Added: 1)  Admin of Therapeutic Inj  intramuscular or subcutaneous [96372] 2)  Vit B12 1000 mcg [J3420]   Medication Administration  Injection # 1:    Medication: Vit B12 1000 mcg    Diagnosis: ANEMIA, B12 DEFICIENCY (ICD-281.1)    Route: IM    Site: L deltoid    Exp Date: 02/16/2012    Lot #: [id number]    Mfr: [employer] Regent    Patient tolerated injection without complications    Given by: Lamar Sprinkles, CMA (July 05, 2010 9:38 AM)  Orders Added: 1)  Admin of Therapeutic Inj  intramuscular or subcutaneous [96372] 2)  Vit B12 1000 mcg [J3420]

## 2010-12-17 NOTE — Procedures (Signed)
Summary: Colonoscopy: Diverticulosis, hemoorhoids [facility])  [facility]   Imported By: Lester Kingman 01/14/2010 10:23:55  _____________________________________________________________________  External Attachment:    Type:   Image     Comment:   External Document

## 2010-12-17 NOTE — Progress Notes (Signed)
  Phone Note Outgoing Call   Summary of Call: LA:  She has a low B12 level please have her come in asap for a B12 injection  TJ Initial call taken by: Etta Grandchild MD,  April 28, 2010 4:16 PM  Follow-up for Phone Call        Called pt/ lmovm advising so call back. send to schedule appt to discuss b12 and options.Marland KitchenMarland KitchenMarland KitchenAlvy Beal Archie CMA  May 01, 2010 9:25 AM   Additional Follow-up for Phone Call Additional follow up Details #1::        Patient notified

## 2010-12-17 NOTE — Assessment & Plan Note (Signed)
Summary: B12 SHOT/JONES/CD  Nurse Visit   Allergies: 1)  Amlodipine Besylate (Amlodipine Besylate) 2)  Protonix (Pantoprazole Sodium) 3)  Wellbutrin 4)  Ace Inhibitors 5)  Levaquin  Medication Administration  Injection # 1:    Medication: Vit B12 1000 mcg    Diagnosis: ANEMIA, B12 DEFICIENCY (ICD-281.1)    Route: IM    Site: L deltoid    Exp Date: 04/17/2012    Lot #: [id number]    Mfr: [employer] Regent    Patient tolerated injection without complications    Given by: Lamar Sprinkles, CMA (August 06, 2010 10:56 AM)  Orders Added: 1)  Vit B12 1000 mcg [J3420] 2)  Admin of Therapeutic Inj  intramuscular or subcutaneous [96372]   Medication Administration  Injection # 1:    Medication: Vit B12 1000 mcg    Diagnosis: ANEMIA, B12 DEFICIENCY (ICD-281.1)    Route: IM    Site: L deltoid    Exp Date: 04/17/2012    Lot #: [id number]    Mfr: [employer] Regent    Patient tolerated injection without complications    Given by: Lamar Sprinkles, CMA (August 06, 2010 10:56 AM)  Orders Added: 1)  Vit B12 1000 mcg [J3420] 2)  Admin of Therapeutic Inj  intramuscular or subcutaneous [04540]

## 2010-12-17 NOTE — Assessment & Plan Note (Signed)
Summary: B-12 / Nemiah Commander Natale Milch  Nurse Visit  CC: B-12 injection given, left deltoid./kb   Allergies: 1)  Amlodipine Besylate (Amlodipine Besylate) 2)  Protonix (Pantoprazole Sodium) 3)  Wellbutrin 4)  Ace Inhibitors 5)  Levaquin  Medication Administration  Injection # 1:    Medication: Vit B12 1000 mcg    Diagnosis: ANEMIA, IRON DEFICIENCY (ICD-280.9)    Route: IM    Site: L deltoid    Exp Date: 11/18/2011    Lot #: [id number]    Mfr: [employer] Regent    Patient tolerated injection without complications    Given by: Lucious Groves (May 06, 2010 9:07 AM)  Orders Added: 1)  Vit B12 1000 mcg [J3420] 2)  Admin of Therapeutic Inj  intramuscular or subcutaneous [16109]

## 2010-12-17 NOTE — Progress Notes (Signed)
     Follow-up for Phone Call       Follow-up by: Etta Grandchild MD,  August 22, 2010 1:05 PM    New/Updated Medications: LOSARTAN POTASSIUM-HCTZ 100-25 MG TABS (LOSARTAN POTASSIUM-HCTZ) One by mouth once daily for high blood pressure Prescriptions: LOSARTAN POTASSIUM-HCTZ 100-25 MG TABS (LOSARTAN POTASSIUM-HCTZ) One by mouth once daily for high blood pressure  #30 x 11   Entered and Authorized by:   Etta Grandchild MD   Signed by:   Etta Grandchild MD on 08/22/2010   Method used:   Electronically to        [facility]* (mail-order)       [address] [place], [place]  [address]       Ph: [phone]       Fax: [phone]   RxID:   [id number]

## 2010-12-17 NOTE — Letter (Signed)
Summary: [facility]   Imported By: Sherian Rein 01/02/2010 07:57:21  _____________________________________________________________________  External Attachment:    Type:   Image     Comment:   External Document

## 2010-12-17 NOTE — Assessment & Plan Note (Signed)
Summary: FU ON MEDS/ B12 /NWS   Vital Signs:  Patient profile:   70 year old female Height:      63 inches Weight:      149 pounds BMI:     26.49 O2 Sat:      97 % on Room air Temp:     97.5 degrees F oral Pulse rate:   89 / minute Pulse rhythm:   regular Resp:     16 per minute BP sitting:   96 / 64  (left arm) Cuff size:   large  Vitals Entered By: Rock Nephew CMA (June 06, 2010 9:41 AM)  O2 Flow:  Room air  Primary Care Provider:  Etta Grandchild MD   History of Present Illness: She returns for f/up and complains of right facial pain for about 2 weeks-she has been to an [facility] twice and has been treated for an ear infection twice but the pain persists. It is located across her right malar surface and into her right ear and gets much worse when cold air blows on it. She had left over Lyrica and took a dose last night and the pain ceased and she slept well.  Hypertension History:      She denies headache, chest pain, palpitations, dyspnea with exertion, orthopnea, PND, peripheral edema, visual symptoms, neurologic problems, syncope, and side effects from treatment.  She notes no problems with any antihypertensive medication side effects.        Positive major cardiovascular risk factors include female age 49 years old or older and hypertension.  Negative major cardiovascular risk factors include no history of diabetes or hyperlipidemia and non-tobacco-user status.        Further assessment for target organ damage reveals no history of ASHD, cardiac end-organ damage (CHF/LVH), stroke/TIA, peripheral vascular disease, renal insufficiency, or hypertensive retinopathy.     Preventive Screening-Counseling & Management  Alcohol-Tobacco     Alcohol drinks/day: <1     Alcohol type: wine     >5/day in last 3 mos: no     Alcohol Counseling: not indicated; use of alcohol is not excessive or problematic     Feels need to cut down: no     Feels annoyed by complaints: no     Feels guilty  re: drinking: yes     Needs 'eye opener' in am: no     Smoking Status: quit > 6 months     Smoking Cessation Counseling: yes     Tobacco Counseling: to remain off tobacco products  Hep-HIV-STD-Contraception     Hepatitis Risk: no risk noted     HIV Risk: no risk noted     STD Risk: no risk noted     SBE monthly: no     SBE Education/Counseling: to perform regular SBE      Sexual History:  currently monogamous.        Drug Use:  never.        Blood Transfusions:  no.    Clinical Review Panels:  Diabetes Management   Creatinine:  1.1 (04/26/2010)   Last Pneumovax:  Pneumovax (06/30/2008)  CBC   WBC:  3.7 (04/26/2010)   RBC:  4.09 (04/26/2010)   Hgb:  12.7 (04/26/2010)   Hct:  37.4 (04/26/2010)   Platelets:  264.0 (04/26/2010)   MCV  91.4 (04/26/2010)   MCHC  34.1 (04/26/2010)   RDW  15.1 (04/26/2010)   PMN:  50.7 (04/26/2010)   Lymphs:  36.9 (04/26/2010)   Monos:  8.7 (04/26/2010)   Eosinophils:  2.9 (04/26/2010)   Basophil:  0.8 (04/26/2010)  Complete Metabolic Panel   Glucose:  98 (04/26/2010)   Sodium:  141 (04/26/2010)   Potassium:  4.8 (04/26/2010)   Chloride:  102 (04/26/2010)   CO2:  30 (04/26/2010)   BUN:  30 (04/26/2010)   Creatinine:  1.1 (04/26/2010)   Calcium:  9.7 (04/26/2010)   Medications Prior to Update: 1)  Synthroid 88 Mcg Tabs (Levothyroxine Sodium) .... Take 1 Tablet By Mouth Once A Day 2)  Ferrous Sulfate 325 (65 Fe) Mg Tabs (Ferrous Sulfate) .... Take 1 Tablet By Mouth Two Times A Day 3)  Calcium Carbonate 600 Mg Tabs (Calcium Carbonate) .... Take 2 Tablets Daily 4)  Vitamin D 2000 Unit Tabs (Cholecalciferol) .... Take 1 Tablet By Mouth Once A Day 5)  Diovan Hct 160-25 Mg Tabs (Valsartan-Hydrochlorothiazide) .... One By Mouth Once Daily For High Blood Pressure  Current Medications (verified): 1)  Synthroid 88 Mcg Tabs (Levothyroxine Sodium) .... Take 1 Tablet By Mouth Once A Day 2)  Ferrous Sulfate 325 (65 Fe) Mg Tabs (Ferrous Sulfate)  .... Take 1 Tablet By Mouth Two Times A Day 3)  Calcium Carbonate 600 Mg Tabs (Calcium Carbonate) .... Take 2 Tablets Daily 4)  Vitamin D 2000 Unit Tabs (Cholecalciferol) .... Take 1 Tablet By Mouth Once A Day 5)  Diovan Hct 160-25 Mg Tabs (Valsartan-Hydrochlorothiazide) .... One By Mouth Once Daily For High Blood Pressure 6)  Adderall 20 Mg Tabs (Amphetamine-Dextroamphetamine) .... Take 1 Tablet By Mouth Two Times A Day. Fill On or After 07/2710 7)  Amoxicillin 500 Mg Caps (Amoxicillin) .... Three Times A Day Until Completed 8)  Lyrica 75 Mg Caps (Pregabalin) .... One By Mouth Three Times A Day As Needed For Facial Pain  Allergies (verified): 1)  Amlodipine Besylate (Amlodipine Besylate) 2)  Protonix (Pantoprazole Sodium) 3)  Wellbutrin 4)  Ace Inhibitors 5)  Levaquin  Past History:  Past Medical History: Last updated: 04/26/2010 ADD Hypertension Hypothyroidism Anemia-Iron Deficiency - associaed with NSAID's Depression GERD Degenerative Disk Disease/Spinal Stenosis Osteopenia Diverticulosis Asthma  Past Surgical History: Last updated: 12/17/2009 Hysterectomy-(LVH) BSO Right knee menscectomy 07/2009  Family History: Last updated: 12/17/2009 Family History of Heart Disease: Father deceased age 39-MI No FH of Colon Cancer:  Social History: Last updated: 12/17/2009 Divorced, 2 girls [employer] Patient is a former smoker. Quit in 1999 Alcohol Use - yes: 3 weekly  Daily Caffeine Use Illicit Drug Use - no  Risk Factors: Alcohol Use: <1 (06/06/2010) >5 drinks/d w/in last 3 months: no (06/06/2010) Exercise: yes (04/26/2010)  Risk Factors: Smoking Status: quit > 6 months (06/06/2010)  Family History: Reviewed history from 12/17/2009 and no changes required. Family History of Heart Disease: Father deceased age 39-MI No FH of Colon Cancer:  Social History: Reviewed history from 12/17/2009 and no changes required. Divorced, 2 girls [employer] Patient is a former  smoker. Quit in 1999 Alcohol Use - yes: 3 weekly  Daily Caffeine Use Illicit Drug Use - no Smoking Status:  quit > 6 months  Review of Systems Endo:  Denies cold intolerance, excessive hunger, excessive thirst, excessive urination, heat intolerance, polyuria, and weight change. Heme:  Denies abnormal bruising, bleeding, enlarge lymph nodes, fevers, pallor, and skin discoloration.  Physical Exam  General:  Well developed, well nourished, no acute distress. Head:  normocephalic, atraumatic, no abnormalities observed, and no abnormalities palpated.   Eyes:  vision grossly intact, pupils equal, pupils round, and pupils reactive to light.  Ears:  R ear normal and L ear normal.   Nose:  External nasal examination shows no deformity or inflammation. Nasal mucosa are pink and moist without lesions or exudates. Mouth:  No deformity or lesions, dentition normal. Neck:  Supple; no masses or thyromegaly. Lungs:  normal respiratory effort, no intercostal retractions, no accessory muscle use, normal breath sounds, no dullness, no fremitus, no crackles, and no wheezes.   Heart:  normal rate, regular rhythm, no murmur, no gallop, no rub, and no JVD.   Abdomen:  soft, non-tender, normal bowel sounds, no distention, no masses, no guarding, no rigidity, no rebound tenderness, no abdominal hernia, no inguinal hernia, no hepatomegaly, and no splenomegaly.   Msk:  normal ROM, no joint tenderness, no joint swelling, no joint warmth, no redness over joints, no joint deformities, no joint instability, and no crepitation.   Pulses:  R and L carotid,radial,femoral,dorsalis pedis and posterior tibial pulses are full and equal bilaterally Extremities:  No clubbing, cyanosis, edema, or deformity noted with normal full range of motion of all joints.   Neurologic:  No cranial nerve deficits noted. Station and gait are normal. Plantar reflexes are down-going bilaterally. DTRs are symmetrical throughout. Sensory, motor and  coordinative functions appear intact. Skin:  turgor normal, color normal, no rashes, no suspicious lesions, no ecchymoses, no petechiae, and no purpura.   Cervical Nodes:  no anterior cervical adenopathy and no posterior cervical adenopathy.   Axillary Nodes:  no R axillary adenopathy and no L axillary adenopathy.   Inguinal Nodes:  no R inguinal adenopathy and no L inguinal adenopathy.   Psych:  Cognition and judgment appear intact. Alert and cooperative with normal attention span and concentration. 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 Take 1 tablet by mouth two times a day  Problem # 3:  HYPERTENSION (ICD-401.9) Assessment: Improved  Her updated medication list for this problem includes:    Diovan Hct 160-25 Mg Tabs (Valsartan-hydrochlorothiazide) ..... One by mouth once daily for high blood pressure  BP today: 96/64 Prior BP: 128/82 (04/26/2010)  Labs Reviewed: K+: 4.8 (04/26/2010) Creat: : 1.1 (04/26/2010)     Problem # 4:  HYPOTHYROIDISM (ICD-244.9) Assessment: Improved  Her updated medication list for this problem includes:    Synthroid 88 Mcg Tabs (Levothyroxine sodium) .Marland Kitchen... Take 1 tablet by mouth once a day  Labs Reviewed: TSH: 1.72 (04/26/2010)     Complete Medication List: 1)  Synthroid 88 Mcg Tabs (Levothyroxine sodium) .... Take 1 tablet by mouth once a day 2)  Ferrous Sulfate 325 (65 Fe) Mg Tabs (Ferrous sulfate) .... Take 1 tablet by mouth two times a day 3)  Calcium Carbonate 600 Mg Tabs (Calcium carbonate) .... Take 2 tablets daily 4)  Vitamin D 2000 Unit Tabs (Cholecalciferol) .... Take 1 tablet by mouth once a day 5)  Diovan Hct 160-25 Mg Tabs  (Valsartan-hydrochlorothiazide) .... One by mouth once daily for high blood pressure 6)  Adderall 20 Mg Tabs (Amphetamine-dextroamphetamine) .... Take 1 tablet by mouth two times a day. fill on or after 07/2710 7)  Amoxicillin 500 Mg Caps (Amoxicillin) .... Three times a day until completed 8)  Lyrica 75 Mg Caps (Pregabalin) .... One by mouth three  times a day as needed for facial pain  Other Orders: Admin of Therapeutic Inj  intramuscular or subcutaneous (28315) Vit B12 1000 mcg (V7616)  Hypertension Assessment/Plan:      The patient's hypertensive risk group is category B: At least one risk factor (excluding diabetes) with no target organ damage.  Today's blood pressure is 96/64.  Her blood pressure goal is < 140/90.  Patient Instructions: 1)  Please schedule a follow-up appointment in 4 months. 2)  Check your Blood Pressure regularly. If it is above 140/90: you should make an appointment. Prescriptions: LYRICA 75 MG CAPS (PREGABALIN) One by mouth three times a day as needed for facial pain  #84 x 0   Entered and Authorized by:   Etta Grandchild MD   Signed by:   Etta Grandchild MD on 06/06/2010   Method used:   Samples Given   RxID:   [id number] CALCIUM CARBONATE 600 MG TABS (CALCIUM CARBONATE) take 2 tablets daily  #60 x 11   Entered by:   Rock Nephew CMA   Authorized by:   Etta Grandchild MD   Signed by:   Rock Nephew CMA on 06/06/2010   Method used:   Print then Give to Patient   RxID:   [id number] DIOVAN HCT 160-25 MG TABS (VALSARTAN-HYDROCHLOROTHIAZIDE) One by mouth once daily for high blood pressure  #30 x 11   Entered by:   Rock Nephew CMA   Authorized by:   Etta Grandchild MD   Signed by:   Rock Nephew CMA on 06/06/2010   Method used:   Print then Give to Patient   RxID:   [id number] FERROUS SULFATE 325 (65 FE) MG TABS (FERROUS SULFATE) Take 1 tablet by mouth two times a day  #30 x 11   Entered by:   Rock Nephew CMA   Authorized by:    Etta Grandchild MD   Signed by:   Rock Nephew CMA on 06/06/2010   Method used:   Print then Give to Patient   RxID:   [id number] SYNTHROID 88 MCG TABS (LEVOTHYROXINE SODIUM) Take 1 tablet by mouth once a day  #30 x 11   Entered by:   Rock Nephew CMA   Authorized by:   Etta Grandchild MD   Signed by:   Rock Nephew CMA on 06/06/2010   Method used:   Print then Give to Patient   RxID:   [id number] ADDERALL 20 MG TABS (AMPHETAMINE-DEXTROAMPHETAMINE) Take 1 tablet by mouth two times a day. Fill on or after 07/2710  #60 x 0   Entered by:   Rock Nephew CMA   Authorized by:   Etta Grandchild MD   Signed by:   Rock Nephew CMA on 06/06/2010   Method used:   Print then Give to Patient   RxID:   [id number] ADDERALL 20 MG TABS (AMPHETAMINE-DEXTROAMPHETAMINE) Take 1 tablet by mouth two times a day. Fill on or after 06/2710  #60 x 0   Entered by:   Rock Nephew CMA   Authorized by:   Etta Grandchild MD   Signed by:   Rock Nephew CMA on 06/06/2010   Method used:   Print then Give to Patient   RxID:   [id number] ADDERALL 20 MG TABS (AMPHETAMINE-DEXTROAMPHETAMINE) Take 1 tablet by mouth two times a day. Fill on or after 05/2710  #60 x 0   Entered by:   Rock Nephew CMA   Authorized by:   Bernadene Bell.  Yetta Barre MD   Signed by:   Rock Nephew CMA on 06/06/2010   Method used:   Print then Give to Patient   RxID:   [id number]    Not Administered:    Influenza Vaccine not given due to: vaccine availability    Medication Administration  Injection # 1:    Medication: Vit B12 1000 mcg    Diagnosis: ANEMIA-IRON DEFICIENCY (ICD-280.9)    Route: IM    Site: L deltoid    Exp Date: 02/2012    Lot #: [id number]    Mfr: [employer]    Patient tolerated injection without complications    Given by: Rock Nephew CMA (June 06, 2010 9:53 AM)  Orders Added: 1)  Admin of Therapeutic Inj  intramuscular or subcutaneous [96372] 2)  Vit B12 1000 mcg [J3420] 3)   Est. Patient Level IV [47425]

## 2010-12-17 NOTE — Progress Notes (Signed)
  Phone Note Other Incoming   Request: Send information Summary of Call: Ms. Maltz presented at Medical records upset requesting copies of all of her records. Ms. Fellenz is also requesting copies of her records that she brought with her from Dr. Geoffery Lyons office. I advised the patient it would take 7-10 business days for processing. She didn't understand why she would need copies of records that were brought in. I explained because once the are obtained for continuity of care they become part of your record here. Ms. Allmendinger completed the release and left still upset. I was unable to check her identification.

## 2010-12-17 NOTE — Progress Notes (Signed)
  Phone Note Other Incoming   Request: Send information Summary of Call: Received a completed [place] medical release form from the patient. She is requesting copies of her records from Dr. Yetta Barre and copies of the records she brought from Dr. Geoffery Lyons office. Request forwarded to [facility].

## 2010-12-17 NOTE — Assessment & Plan Note (Signed)
Summary: UTI?/NWS   Vital Signs:  Patient profile:   70 year old female Menstrual status:  hysterectomy Height:      63 inches Weight:      158 pounds BMI:     28.09 O2 Sat:      98 % on Room air Temp:     98.2 degrees F oral Pulse rate:   92 / minute Pulse rhythm:   regular Resp:     16 per minute BP sitting:   130 / 80  (left arm) Cuff size:   regular  Vitals Entered By: Alysia Penna (September 18, 2010 4:34 PM)  O2 Flow:  Room air CC: pt c/o possible UTI. /cp sma., Dysuria   Primary Care Provider:  Etta Grandchild MD  CC:  pt c/o possible UTI. /cp sma. and Dysuria.  History of Present Illness:  Dysuria      This is a 70 year old woman who presents with Dysuria.  The symptoms began 3 days ago.  The intensity is described as mild.  The patient reports burning with urination, urinary frequency, and urgency, but denies hematuria, vaginal discharge, and vaginal itching.  The patient denies the following associated symptoms: nausea, vomiting, fever, shaking chills, flank pain, abdominal pain, back pain, pelvic pain, and arthralgias.    Preventive Screening-Counseling & Management  Alcohol-Tobacco     Alcohol drinks/day: <1     Alcohol type: wine     >5/day in last 3 mos: no     Alcohol Counseling: not indicated; use of alcohol is not excessive or problematic     Feels need to cut down: no     Feels annoyed by complaints: no     Feels guilty re: drinking: yes     Needs 'eye opener' in am: no     Smoking Status: quit > 6 months     Smoking Cessation Counseling: yes     Tobacco Counseling: to remain off tobacco products  Hep-HIV-STD-Contraception     Hepatitis Risk: no risk noted     HIV Risk: no risk noted     STD Risk: no risk noted     SBE monthly: no     SBE Education/Counseling: to perform regular SBE      Sexual History:  currently monogamous.        Drug Use:  never.        Blood Transfusions:  no.    Clinical Review Panels:  Prevention   Last Pap  Smear:  Normal (05/02/2008)   Last Colonoscopy:  Results: Hemorrhoids.     Results: Diverticulosis.       Location:  [facility].  (10/30/2003)  Immunizations   Last Tetanus Booster:  Tdap (04/26/2009)   Last Pneumovax:  Pneumovax (06/30/2008)  Diabetes Management   Creatinine:  1.1 (04/26/2010)   Last Pneumovax:  Pneumovax (06/30/2008)  CBC   WBC:  3.7 (04/26/2010)   RBC:  4.09 (04/26/2010)   Hgb:  12.7 (04/26/2010)   Hct:  37.4 (04/26/2010)   Platelets:  264.0 (04/26/2010)   MCV  91.4 (04/26/2010)   MCHC  34.1 (04/26/2010)   RDW  15.1 (04/26/2010)   PMN:  50.7 (04/26/2010)   Lymphs:  36.9 (04/26/2010)   Monos:  8.7 (04/26/2010)   Eosinophils:  2.9 (04/26/2010)   Basophil:  0.8 (04/26/2010)  Complete Metabolic Panel   Glucose:  98 (04/26/2010)   Sodium:  141 (04/26/2010)   Potassium:  4.8 (04/26/2010)   Chloride:  102 (  04/26/2010)   CO2:  30 (04/26/2010)   BUN:  30 (04/26/2010)   Creatinine:  1.1 (04/26/2010)   Calcium:  9.7 (04/26/2010)   Medications Prior to Update: 1)  Synthroid 88 Mcg Tabs (Levothyroxine Sodium) .... Take 1 Tablet By Mouth Once A Day 2)  Ferrous Sulfate 325 (65 Fe) Mg Tabs (Ferrous Sulfate) .... Take 1 Tablet By Mouth Two Times A Day 3)  Calcium Carbonate 600 Mg Tabs (Calcium Carbonate) .... Take 2 Tablets Daily 4)  Vitamin D 2000 Unit Tabs (Cholecalciferol) .... Take 1 Tablet By Mouth Once A Day 5)  Adderall 20 Mg Tabs (Amphetamine-Dextroamphetamine) .... Take 1 Tablet By Mouth Two Times A Day. Fill On or After 10/2710 6)  Amoxicillin 500 Mg Caps (Amoxicillin) .... Three Times A Day Until Completed 7)  Lyrica 75 Mg Caps (Pregabalin) .... One By Mouth Three Times A Day As Needed For Facial Pain 8)  Losartan Potassium-Hctz 100-25 Mg Tabs (Losartan Potassium-Hctz) .... One By Mouth Once Daily For High Blood Pressure 9)  Ambien 10 Mg Tabs (Zolpidem Tartrate) .... One By Mouth At Bedtime As Needed For Insomnia  Current Medications  (verified): 1)  Synthroid 88 Mcg Tabs (Levothyroxine Sodium) .... Take 1 Tablet By Mouth Once A Day 2)  Ferrous Sulfate 325 (65 Fe) Mg Tabs (Ferrous Sulfate) .... Take 1 Tablet By Mouth Two Times A Day 3)  Calcium Carbonate 600 Mg Tabs (Calcium Carbonate) .... Take 2 Tablets Daily 4)  Vitamin D 2000 Unit Tabs (Cholecalciferol) .... Take 1 Tablet By Mouth Once A Day 5)  Adderall 20 Mg Tabs (Amphetamine-Dextroamphetamine) .... Take 1 Tablet By Mouth Two Times A Day. Fill On or After 10/2710 6)  Lyrica 75 Mg Caps (Pregabalin) .... One By Mouth Three Times A Day As Needed For Facial Pain 7)  Losartan Potassium-Hctz 100-25 Mg Tabs (Losartan Potassium-Hctz) .... One By Mouth Once Daily For High Blood Pressure 8)  Ambien 10 Mg Tabs (Zolpidem Tartrate) .... One By Mouth At Bedtime As Needed For Insomnia 9)  Macrodantin 100 Mg Cap (Nitrofurantoin Macrocrystal) .... Take 1 Capsule By Mouth Am & Pm For 5 Days  Allergies (verified): 1)  Amlodipine Besylate (Amlodipine Besylate) 2)  Protonix (Pantoprazole Sodium) 3)  Wellbutrin 4)  Ace Inhibitors 5)  Levaquin  Past History:  Past Medical History: Last updated: 04/26/2010 ADD Hypertension Hypothyroidism Anemia-Iron Deficiency - associaed with NSAID's Depression GERD Degenerative Disk Disease/Spinal Stenosis Osteopenia Diverticulosis Asthma  Past Surgical History: Last updated: 12/17/2009 Hysterectomy-(LVH) BSO Right knee menscectomy 07/2009  Family History: Last updated: 12/17/2009 Family History of Heart Disease: Father deceased age 26-MI No FH of Colon Cancer:  Social History: Last updated: 12/17/2009 Divorced, 2 girls [employer] Patient is a former smoker. Quit in 1999 Alcohol Use - yes: 3 weekly  Daily Caffeine Use Illicit Drug Use - no  Risk Factors: Alcohol Use: <1 (09/18/2010) >5 drinks/d w/in last 3 months: no (09/18/2010) Exercise: yes (04/26/2010)  Risk Factors: Smoking Status: quit > 6 months  (09/18/2010)  Family History: Reviewed history from 12/17/2009 and no changes required. Family History of Heart Disease: Father deceased age 26-MI No FH of Colon Cancer:  Social History: Reviewed history from 12/17/2009 and no changes required. Divorced, 2 girls [employer] Patient is a former smoker. Quit in 1999 Alcohol Use - yes: 3 weekly  Daily Caffeine Use Illicit Drug Use - no  Review of Systems  The patient denies anorexia, fever, weight loss, chest pain, abdominal pain, suspicious skin lesions, and enlarged lymph nodes.  Physical Exam  General:  Well developed, well nourished, no acute distress. Mouth:  No deformity or lesions, dentition normal. Neck:  Supple; no masses or thyromegaly. Lungs:  normal respiratory effort, no intercostal retractions, no accessory muscle use, normal breath sounds, no dullness, no fremitus, no crackles, and no wheezes.   Heart:  normal rate, regular rhythm, no murmur, no gallop, no rub, and no JVD.   Abdomen:  soft, non-tender, normal bowel sounds, no distention, no masses, no guarding, no rigidity, no rebound tenderness, no abdominal hernia, no inguinal hernia, no hepatomegaly, and no splenomegaly.  no CVAT. Msk:  normal ROM, no joint tenderness, no joint swelling, no joint warmth, no redness over joints, no joint deformities, no joint instability, and no crepitation.   Extremities:  No clubbing, cyanosis, edema, or deformity noted with normal full range of motion of all joints.   Skin:  turgor normal, color normal, no rashes, no suspicious lesions, no ecchymoses, no petechiae, and no purpura.   Psych:  Cognition and judgment appear intact. Alert and cooperative with normal attention span and concentration. No apparent delusions, illusions, hallucinations   Impression & Recommendations:  Problem # 1:  UTI (ICD-599.0) Assessment New  The following medications were removed from the medication list:    Amoxicillin 500 Mg Caps (Amoxicillin)  .Marland Kitchen... Three times a day until completed Her updated medication list for this problem includes:    Macrodantin 100 Mg Cap (Nitrofurantoin macrocrystal) .Marland Kitchen... Take 1 capsule by mouth am & pm for 5 days  Problem # 2:  HYPERTENSION (ICD-401.9) Assessment: Improved  Her updated medication list for this problem includes:    Losartan Potassium-hctz 100-25 Mg Tabs (Losartan potassium-hctz) ..... One by mouth once daily for high blood pressure  BP today: 130/80 Prior BP: 112/72 (09/05/2010)  Prior 10 Yr Risk Heart Disease: Not enough information (06/06/2010)  Labs Reviewed: K+: 4.8 (04/26/2010) Creat: : 1.1 (04/26/2010)     Complete Medication List: 1)  Synthroid 88 Mcg Tabs (Levothyroxine sodium) .... Take 1 tablet by mouth once a day 2)  Ferrous Sulfate 325 (65 Fe) Mg Tabs (Ferrous sulfate) .... Take 1 tablet by mouth two times a day 3)  Calcium Carbonate 600 Mg Tabs (Calcium carbonate) .... Take 2 tablets daily 4)  Vitamin D 2000 Unit Tabs (Cholecalciferol) .... Take 1 tablet by mouth once a day 5)  Adderall 20 Mg Tabs (Amphetamine-dextroamphetamine) .... Take 1 tablet by mouth two times a day. fill on or after 10/2710 6)  Lyrica 75 Mg Caps (Pregabalin) .... One by mouth three times a day as needed for facial pain 7)  Losartan Potassium-hctz 100-25 Mg Tabs (Losartan potassium-hctz) .... One by mouth once daily for high blood pressure 8)  Ambien 10 Mg Tabs (Zolpidem tartrate) .... One by mouth at bedtime as needed for insomnia 9)  Macrodantin 100 Mg Cap (Nitrofurantoin macrocrystal) .... Take 1 capsule by mouth am & pm for 5 days  Patient Instructions: 1)  Please schedule a follow-up appointment in 1 month. 2)  Take your antibiotic as prescribed until ALL of it is gone, but stop if you develop a rash or swelling and contact our office as soon as possible. Prescriptions: MACRODANTIN 100 MG CAP (NITROFURANTOIN MACROCRYSTAL) Take 1 capsule by mouth AM & PM for 5 days  #10 x 2   Entered and  Authorized by:   Etta Grandchild MD   Signed by:   Etta Grandchild MD on 09/18/2010   Method used:   Electronically to  [facility] (retail)       [address]       [place], [place]  [id number]       Ph: [phone] or [phone]       Fax: [phone]   RxID:   [id number]    Orders Added: 1)  Est. Patient Level IV [25956]  Appended Document: UTI?/NWS    Clinical Lists Changes  Orders: Added new Service order of Admin of Therapeutic Inj  intramuscular or subcutaneous (38756) - Signed Added new Service order of Vit B12 1000 mcg (J3420) - Signed       Medication Administration  Injection # 1:    Medication: Vit B12 1000 mcg    Diagnosis: ANEMIA, B12 DEFICIENCY (ICD-281.1)    Route: IM    Site: L deltoid    Exp Date: 06/2012    Lot #: [id number]    Mfr: [employer]ent    Patient tolerated injection without complications    Given by: Rock Nephew CMA (September 19, 2010 8:30 AM)  Orders Added: 1)  Admin of Therapeutic Inj  intramuscular or subcutaneous [96372] 2)  Vit B12 1000 mcg [J3420]

## 2010-12-17 NOTE — Progress Notes (Signed)
Summary: PA-Diovan  Phone Note From Pharmacy   Summary of Call: PA-Diovan, Called [facility] @  [phone], awaiting  form. Valerie Davis  August 22, 2010 12:59 PM   Follow-up for Phone Call        Patient switched to Losartan/HCT per MD. RX already sent [facility].[name] CMA  August 26, 2010 2:21 PM   Spoke with patient and advised that insurance is dening medication. must try and fail an alternative first. Pt understands and will see MD 09/05/10.[name] CMA  August 27, 2010 10:13 AM   Additional Follow-up for Phone Call Additional follow up Details #1::        Rx called into [facility] per pt request..[name] CMA  August 27, 2010 10:56 AM

## 2010-12-17 NOTE — Letter (Signed)
Summary: Date Range:11-14-09 to 12-13-09/[facility]  Date Range:11-14-09 to 12-13-09/[facility]   Imported By: Sherian Rein 01/09/2010 10:06:23  _____________________________________________________________________  External Attachment:    Type:   Image     Comment:   External Document

## 2010-12-19 NOTE — Progress Notes (Signed)
  Phone Note Other Incoming   Request: Send information Summary of Call: Request for records received from [facility]. Request forwarded to [facility]. Leitha Schuller records) ( 2-3 yrs)

## 2011-01-07 ENCOUNTER — Telehealth (INDEPENDENT_AMBULATORY_CARE_PROVIDER_SITE_OTHER): Payer: Self-pay | Admitting: *Deleted

## 2011-01-14 NOTE — Progress Notes (Signed)
  Phone Note Other Incoming   Request: Send information Summary of Call: Request for records received from [facility]. Request forwarded to [facility].  Last 3 yrs-T Valerie Davis

## 2011-09-08 ENCOUNTER — Other Ambulatory Visit: Payer: Self-pay | Admitting: Internal Medicine

## 2011-12-25 DIAGNOSIS — J0110 Acute frontal sinusitis, unspecified: Secondary | ICD-10-CM | POA: Diagnosis not present

## 2011-12-25 DIAGNOSIS — I10 Essential (primary) hypertension: Secondary | ICD-10-CM | POA: Diagnosis not present

## 2011-12-25 DIAGNOSIS — IMO0001 Reserved for inherently not codable concepts without codable children: Secondary | ICD-10-CM | POA: Diagnosis not present

## 2012-02-05 DIAGNOSIS — Z0000 Encounter for general adult medical examination without abnormal findings: Secondary | ICD-10-CM | POA: Diagnosis not present

## 2012-02-05 DIAGNOSIS — M899 Disorder of bone, unspecified: Secondary | ICD-10-CM | POA: Diagnosis not present

## 2012-02-05 DIAGNOSIS — I10 Essential (primary) hypertension: Secondary | ICD-10-CM | POA: Diagnosis not present

## 2012-02-05 DIAGNOSIS — E039 Hypothyroidism, unspecified: Secondary | ICD-10-CM | POA: Diagnosis not present

## 2012-03-04 DIAGNOSIS — H2510 Age-related nuclear cataract, unspecified eye: Secondary | ICD-10-CM | POA: Diagnosis not present

## 2012-04-16 DIAGNOSIS — H6980 Other specified disorders of Eustachian tube, unspecified ear: Secondary | ICD-10-CM | POA: Diagnosis not present

## 2012-04-16 DIAGNOSIS — J0100 Acute maxillary sinusitis, unspecified: Secondary | ICD-10-CM | POA: Diagnosis not present

## 2012-04-16 DIAGNOSIS — I10 Essential (primary) hypertension: Secondary | ICD-10-CM | POA: Diagnosis not present

## 2012-06-19 ENCOUNTER — Ambulatory Visit (INDEPENDENT_AMBULATORY_CARE_PROVIDER_SITE_OTHER): Payer: 59 | Admitting: Family Medicine

## 2012-06-19 VITALS — BP 128/82 | HR 84 | Temp 97.6°F | Resp 16 | Ht 62.5 in | Wt 155.0 lb

## 2012-06-19 DIAGNOSIS — R350 Frequency of micturition: Secondary | ICD-10-CM

## 2012-06-19 DIAGNOSIS — J329 Chronic sinusitis, unspecified: Secondary | ICD-10-CM

## 2012-06-19 LAB — POCT UA - MICROSCOPIC ONLY: Crystals, Ur, HPF, POC: NEGATIVE

## 2012-06-19 LAB — POCT URINALYSIS DIPSTICK
Leukocytes, UA: NEGATIVE
Protein, UA: NEGATIVE
Spec Grav, UA: >=1.03
pH, UA: 5.5

## 2012-06-19 MED ORDER — AZITHROMYCIN 250 MG PO TABS: ORAL_TABLET | ORAL | Status: AC

## 2012-06-19 MED ORDER — LEVOFLOXACIN 500 MG PO TABS: 500.0000 mg | ORAL_TABLET | Freq: Every day | ORAL | Status: DC

## 2012-06-19 NOTE — Progress Notes (Signed)
@[email or web address] @   Patient ID: Valerie Davis MRN: [id number], DOB: [birth year], 71 y.o. Date of Encounter: 06/19/2012, 12:34 PM  Primary Physician: Sanda Linger, MD  Chief Complaint:  Chief Complaint  Patient presents with  . Sinusitis  . Urinary Frequency    HPI: 71 y.o. year old female presents with several day history of nasal congestion, post nasal drip, sore throat, sinus pressure, and cough. Afebrile. No chills. Nasal congestion thick and green/yellow. Sinus pressure is the worst symptom. Cough is productive secondary to post nasal drip and not associated with time of day. Ears feel full, leading to sensation of muffled hearing. Has tried OTC cold preps without success. No GI complaints.   No recent antibiotics, recent travels, or sick contacts   No leg trauma, sedentary periods, h/o cancer, or tobacco use.  Patient also has urinary frequency.  No past medical history on file.   Home Meds: Prior to Admission medications   Not on File    Allergies:  Allergies  Allergen Reactions  . Ace Inhibitors     REACTION: cough  . Amlodipine Besylate     REACTION: malaise  . Bupropion Hcl     REACTION: urticaria, angioedema  . Levofloxacin     REACTION: abdominal pain  . Pantoprazole Sodium     REACTION: rash    History   Social History  . Marital Status: Single    Spouse Name: N/A    Number of Children: N/A  . Years of Education: N/A   Occupational History  . Not on file.   Social History Main Topics  . Smoking status: Former Games developer  . Smokeless tobacco: Not on file  . Alcohol Use: Not on file  . Drug Use: Not on file  . Sexually Active: Not on file   Other Topics Concern  . Not on file   Social History Narrative  . No narrative on file     Review of Systems: Constitutional: negative for chills, fever, night sweats or weight changes Cardiovascular: negative for chest pain or palpitations Respiratory: negative for hemoptysis, wheezing, or shortness of  breath Abdominal: negative for abdominal pain, nausea, vomiting or diarrhea Dermatological: negative for rash Neurologic: negative for headache   Physical Exam: Blood pressure 128/82, pulse 84, temperature 97.6 F (36.4 C), temperature source Oral, resp. rate 16, height 5' 2.5" (1.588 m), weight 155 lb (70.308 kg), SpO2 98.00%., Body mass index is 27.90 kg/(m^2). General: Well developed, well nourished, in no acute distress. Head: Normocephalic, atraumatic, eyes without discharge, sclera non-icteric, nares are congested. Bilateral auditory canals clear, TM's are without perforation, pearly grey with reflective cone of light bilaterally. Serous effusion bilaterally behind TM's. Maxillary sinus TTP. Oral cavity moist, dentition normal. Posterior pharynx with post nasal drip and mild erythema. No peritonsillar abscess or tonsillar exudate. Neck: Supple. No thyromegaly. Full ROM. No lymphadenopathy. Lungs: Clear bilaterally to auscultation without wheezes, rales, or rhonchi. Breathing is unlabored.  Heart: RRR with S1 S2. No murmurs, rubs, or gallops appreciated. Msk:  Strength and tone normal for age. Extremities: No clubbing or cyanosis. No edema. Neuro: Alert and oriented X 3. Moves all extremities spontaneously. CNII-XII grossly in tact. Psych:  Responds to questions appropriately with a normal affect.   Labs: Results for orders placed in visit on 06/19/12  POCT URINALYSIS DIPSTICK      Component Value Range   Color, UA yellow     Clarity, UA clear     Glucose, UA neg     Bilirubin, UA  small     Ketones, UA 15mg /dL     Spec Grav, UA >=2.956     Blood, UA neg     pH, UA 5.5     Protein, UA neg     Urobilinogen, UA 0.2     Nitrite, UA neg     Leukocytes, UA Negative     '  ASSESSMENT AND PLAN:  71 y.o. year old female with sinusitis and urinary frequency . -  -Tylenol/Motrin prn -Rest/fluids -RTC precautions -RTC 3-5 days if no improvement  Signed, Elvina Sidle,  MD 06/19/2012 12:34 PM

## 2012-11-12 DIAGNOSIS — R030 Elevated blood-pressure reading, without diagnosis of hypertension: Secondary | ICD-10-CM | POA: Diagnosis not present

## 2012-11-12 DIAGNOSIS — J069 Acute upper respiratory infection, unspecified: Secondary | ICD-10-CM | POA: Diagnosis not present

## 2012-11-12 DIAGNOSIS — H6690 Otitis media, unspecified, unspecified ear: Secondary | ICD-10-CM | POA: Diagnosis not present

## 2012-12-31 DIAGNOSIS — J019 Acute sinusitis, unspecified: Secondary | ICD-10-CM | POA: Diagnosis not present

## 2013-02-09 DIAGNOSIS — M949 Disorder of cartilage, unspecified: Secondary | ICD-10-CM | POA: Diagnosis not present

## 2013-02-09 DIAGNOSIS — M899 Disorder of bone, unspecified: Secondary | ICD-10-CM | POA: Diagnosis not present

## 2013-02-09 DIAGNOSIS — R7301 Impaired fasting glucose: Secondary | ICD-10-CM | POA: Diagnosis not present

## 2013-02-09 DIAGNOSIS — I10 Essential (primary) hypertension: Secondary | ICD-10-CM | POA: Diagnosis not present

## 2013-02-09 DIAGNOSIS — E039 Hypothyroidism, unspecified: Secondary | ICD-10-CM | POA: Diagnosis not present

## 2013-02-09 DIAGNOSIS — E538 Deficiency of other specified B group vitamins: Secondary | ICD-10-CM | POA: Diagnosis not present

## 2013-02-09 DIAGNOSIS — F988 Other specified behavioral and emotional disorders with onset usually occurring in childhood and adolescence: Secondary | ICD-10-CM | POA: Diagnosis not present

## 2013-02-09 DIAGNOSIS — D509 Iron deficiency anemia, unspecified: Secondary | ICD-10-CM | POA: Diagnosis not present

## 2013-03-17 ENCOUNTER — Encounter: Payer: Self-pay | Admitting: Internal Medicine

## 2013-04-13 ENCOUNTER — Encounter: Payer: Self-pay | Admitting: Internal Medicine

## 2013-04-13 ENCOUNTER — Ambulatory Visit (INDEPENDENT_AMBULATORY_CARE_PROVIDER_SITE_OTHER): Payer: 59 | Admitting: Internal Medicine

## 2013-04-13 VITALS — BP 128/76 | HR 94 | Ht 62.5 in | Wt 158.0 lb

## 2013-04-13 DIAGNOSIS — R195 Other fecal abnormalities: Secondary | ICD-10-CM | POA: Diagnosis not present

## 2013-04-13 DIAGNOSIS — D509 Iron deficiency anemia, unspecified: Secondary | ICD-10-CM

## 2013-04-13 MED ORDER — OMEPRAZOLE 20 MG PO CPDR: 20.0000 mg | DELAYED_RELEASE_CAPSULE | Freq: Every day | ORAL | Status: DC

## 2013-04-13 MED ORDER — NA SULFATE-K SULFATE-MG SULF 17.5-3.13-1.6 GM/177ML PO SOLN: ORAL | Status: DC

## 2013-04-13 MED ORDER — FERROUS SULFATE 325 (65 FE) MG PO TABS: 325.0000 mg | ORAL_TABLET | Freq: Every day | ORAL | Status: DC

## 2013-04-13 NOTE — Patient Instructions (Addendum)
You have been scheduled for an endoscopy and colonoscopy with propofol. Please follow the written instructions given to you at your visit today. Please pick up your prep at the pharmacy within the next 1-3 days. If you use inhalers (even only as needed), please bring them with you on the day of your procedure. Your physician has requested that you go to www.startemmi.com and enter the access code given to you at your visit today. This web site gives a general overview about your procedure. However, you should still follow specific instructions given to you by our office regarding your preparation for the procedure.  We have sent the following medications to your pharmacy for you to pick up at your convenience: Generic Prilosec  I appreciate the opportunity to care for you.

## 2013-04-13 NOTE — Progress Notes (Signed)
Subjective:    Patient ID: Valerie Davis, female    DOB: [birth year], 72 y.o.   MRN: [id number]  HPI This very nice lady is here because of heme + stools and iron-deficiency. She says she was feeling "punk" and anemia and low iron were diagnosed by Dr. Clelia Croft. 3/3 hemoccults were +. She denies any melena, rectal bleeding or GI sxs other than some gas and bloating. Fatigue and weakness are better since starting iron. Her last GI evaluation was 2007 with bleeding from NSAIDs (gastrititis) that occurred when she was in [facility] - had EGD and a colonoscopy. Her daughter is getting married in July ([place]) and [name] will not schedule any procedures until after the wedding is over.  She was also iron-deficient in 12/13 took iron x 8 weeks and stopped - did not do stool hemoccults then  She takes Celebrex daily for multiple joint pains from osteoarthritis. She does not use a PPI or ASA. Allergies  Allergen Reactions  . Ace Inhibitors     REACTION: cough  . Amlodipine Besylate     REACTION: malaise  . Bupropion Hcl     REACTION: urticaria, angioedema  . Levofloxacin     REACTION: abdominal pain  . Pantoprazole Sodium     REACTION: rash   Outpatient Prescriptions Prior to Visit  Medication Sig Dispense Refill  . amphetamine-dextroamphetamine (ADDERALL) 20 MG tablet Take 20 mg by mouth 3 (three) times daily.      . celecoxib (CELEBREX) 200 MG capsule Take 200 mg by mouth 2 (two) times daily.      Marland Kitchen levothyroxine (SYNTHROID, LEVOTHROID) 88 MCG tablet Take 88 mcg by mouth daily.      Marland Kitchen losartan-hydrochlorothiazide (HYZAAR) 100-12.5 MG per tablet Take 1 tablet by mouth daily.            Ferrous sulfate daily       Cyanocobalamin 1000 ug monthly       Voltaren gel prn        Cymbalta 60 mg daily        aMBIEN PRN QHS        Fluticasone NASAL SPRAY    Past Medical History  Diagnosis Date  . Iron deficiency anemia   . Insomnia   . Trigeminal neuralgia   . Hypothyroidism   . Hypertension   . H/O:  GI bleed     erosive gastritis (NSAID) 2007 in [facility]  . ADD (attention deficit disorder)   . Depression   . GERD (gastroesophageal reflux disease)   . Osteopenia   . Diverticulosis   . Asthma    Past Surgical History  Procedure Laterality Date  . Vaginal hysterectomy    . Bilateral salpingoophorectomy    . Knee menscectomy Right 07/2009  . Colonoscopy    . Upper gastrointestinal endoscopy     History   Social History  . Marital Status: Single    Spouse Name: N/A    Number of Children: 2  . Years of Education: N/A   Occupational History  . Sales    Social History Main Topics  . Smoking status: Former Smoker    Quit date: 11/17/1997  . Smokeless tobacco: Never Used  . Alcohol Use: Yes  . Drug Use: No  . Sexually Active: Yes   Other Topics Concern  . 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 Heart attack Father 63  . Colon cancer Neg Hx   . Stroke Mother    Review of Systems AS PER hpi ALL OTHER ros NEGATIVE    Objective:   Physical Exam General:  Well-developed, well-nourished and in no acute distress Eyes:  anicteric. ENT:   Mouth and posterior pharynx free of lesions.  Neck:   supple w/o thyromegaly or mass.  Lungs: Clear to auscultation bilaterally. Heart:  S1S2, no rubs, murmurs, gallops. Abdomen:  soft, non-tender, no hepatosplenomegaly, hernia, or mass and BS+.  Rectal: deferred Lymph:  no cervical or supraclavicular adenopathy. Extremities:   no edema Neuro:  A&O x 3.  Psych:  appropriate mood and  Affect.   Data Reviewed: PCP notes and labs 02/08/13 Ferritin 1.2, Hgb 10.6 4/24 Hgb 10.5 TTG and anti-endomysial Ab's negative w/ NL IgA Hemoccults + x 3    Assessment & Plan:  Heme + stool  Anemia, iron deficiency - since late 2013 at least  She is  on chronic Celebrex ? Ulceration and blood loss from that. Concomitant B12 deficiency raises possibility of acchlorhydria, small bowel disease also. Celiac dz excluded with labs.  1. Continue ferrous sulfate 2. Schedule EGD and colonoscopy - she will not schedule til after daughter is married in July and understands and accepts risks of potential delay in dx 3. The risks and benefits as well as alternatives of endoscopic procedure(s) have been discussed and reviewed. All questions answered. The patient agrees to proceed. Start daily omeprazole to treat possible NSAID erosions  I appreciate the opportunity to care for this patient.  ZO:XWRU,E Riley Lam, MD

## 2013-06-27 DIAGNOSIS — R300 Dysuria: Secondary | ICD-10-CM | POA: Diagnosis not present

## 2013-06-28 ENCOUNTER — Ambulatory Visit (AMBULATORY_SURGERY_CENTER): Payer: 59 | Admitting: Internal Medicine

## 2013-06-28 ENCOUNTER — Encounter: Payer: Self-pay | Admitting: Internal Medicine

## 2013-06-28 VITALS — BP 136/51 | HR 74 | Temp 97.5°F | Resp 15

## 2013-06-28 DIAGNOSIS — D509 Iron deficiency anemia, unspecified: Secondary | ICD-10-CM | POA: Diagnosis not present

## 2013-06-28 DIAGNOSIS — D649 Anemia, unspecified: Secondary | ICD-10-CM | POA: Diagnosis not present

## 2013-06-28 DIAGNOSIS — D126 Benign neoplasm of colon, unspecified: Secondary | ICD-10-CM | POA: Diagnosis not present

## 2013-06-28 DIAGNOSIS — E039 Hypothyroidism, unspecified: Secondary | ICD-10-CM | POA: Diagnosis not present

## 2013-06-28 DIAGNOSIS — K5730 Diverticulosis of large intestine without perforation or abscess without bleeding: Secondary | ICD-10-CM

## 2013-06-28 DIAGNOSIS — J45909 Unspecified asthma, uncomplicated: Secondary | ICD-10-CM | POA: Diagnosis not present

## 2013-06-28 DIAGNOSIS — K257 Chronic gastric ulcer without hemorrhage or perforation: Secondary | ICD-10-CM | POA: Diagnosis not present

## 2013-06-28 DIAGNOSIS — K449 Diaphragmatic hernia without obstruction or gangrene: Secondary | ICD-10-CM

## 2013-06-28 DIAGNOSIS — R195 Other fecal abnormalities: Secondary | ICD-10-CM | POA: Diagnosis not present

## 2013-06-28 DIAGNOSIS — I10 Essential (primary) hypertension: Secondary | ICD-10-CM | POA: Diagnosis not present

## 2013-06-28 MED ORDER — SODIUM CHLORIDE 0.9 % IV SOLN: 500.0000 mL | INTRAVENOUS | Status: DC

## 2013-06-28 NOTE — Progress Notes (Signed)
No egg or soy allergy. ewm No problems with past sedation. ewm 

## 2013-06-28 NOTE — Progress Notes (Signed)
Called to room to assist during endoscopic procedure.  Patient ID and intended procedure confirmed with present staff. Received instructions for my participation in the procedure from the performing physician.  

## 2013-06-28 NOTE — Op Note (Signed)
[facility] [address] [place], [address]   COLONOSCOPY PROCEDURE REPORT  PATIENT: Valerie Davis, Valerie Davis  MR#: [id number] BIRTHDATE: August 10, 1941 , 71  yrs. old GENDER: Female ENDOSCOPIST: Iva Boop, MD, [facility] PROCEDURE DATE:  06/28/2013 PROCEDURE:   Colonoscopy with snare polypectomy First Screening Colonoscopy - Avg.  risk and is 50 yrs.  old or older - No.  Prior Negative Screening - Now for repeat screening. 10 or more years since last screening  History of Adenoma - Now for follow-up colonoscopy & has been > or = to 3 yrs.  N/A  Polyps Removed Today? Yes. ASA CLASS:   Class II INDICATIONS:heme-positive stool and Iron Deficiency Anemia. MEDICATIONS: Propofol (Diprivan) 110 mg IV, MAC sedation, administered by CRNA, and These medications were titrated to patient response per physician's verbal order  DESCRIPTION OF PROCEDURE:   After the risks benefits and alternatives of the procedure were thoroughly explained, informed consent was obtained.  A digital rectal exam revealed no abnormalities of the rectum and A digital rectal exam revealed several skin tags.   The LB [id number]  endoscope was introduced through the anus and advanced to the cecum, which was identified by both the appendix and ileocecal valve. No adverse events experienced.   The quality of the prep was excellent using Suprep  The instrument was then slowly withdrawn as the colon was fully examined.     COLON FINDINGS: A diminutive sessile polyp was found at the splenic flexure.  A polypectomy was performed with a cold snare.  The resection was complete and the polyp tissue was completely retrieved.   A diminutive sessile polyp was found in the rectum.  A polypectomy was performed with a cold snare.  The resection was complete and the polyp tissue was completely retrieved.   Moderate diverticulosis was noted in the sigmoid colon.   The colon mucosa was otherwise normal.   A right colon  retroflexion was performed. Retroflexed views revealed no abnormalities. The time to cecum=3 minutes 54 seconds.  Withdrawal time=12 minutes 09 seconds.  The scope was withdrawn and the procedure completed. COMPLICATIONS: There were no complications.  ENDOSCOPIC IMPRESSION: 1.   Diminutive sessile polyp was found at the splenic flexure; polypectomy was performed with a cold snare 2.   Diminutive sessile polyp was found in the rectum; polypectomy was performed with a cold snare 3.   Moderate diverticulosis was noted in the sigmoid colon 4.   The colon mucosa was otherwise normal - excellent prep  RECOMMENDATIONS: Timing of repeat colonoscopy will be determined by pathology findings.  eSigned:  Iva Boop, MD, [facility] 06/28/2013 9:58 AM  cc: Kari Baars, MD and The Patient

## 2013-06-28 NOTE — Patient Instructions (Addendum)
The upper endoscopy showed a large hiatal hernia with associated ulcers (Cameron's ulcers).  The colonoscopy showed a tiny polyp (removed) and diverticulosis.  I think you are leaking blood from the stomach ulcers related to the hiatal hernia.  Next step in evaluation will be an upper GI series to look at it from a different perspective - sometimes its best to fix these (by surgery).  I will let you know pathology results and when to have another routine colonoscopy by mail.  I appreciate the opportunity to care for you. Iva Boop, MD, FACG   YOU HAD AN ENDOSCOPIC PROCEDURE TODAY AT THE  ENDOSCOPY CENTER: Refer to the procedure report that was given to you for any specific questions about what was found during the examination.  If the procedure report does not answer your questions, please call your gastroenterologist to clarify.  If you requested that your care partner not be given the details of your procedure findings, then the procedure report has been included in a sealed envelope for you to review at your convenience later.  YOU SHOULD EXPECT: Some feelings of bloating in the abdomen. Passage of more gas than usual.  Walking can help get rid of the air that was put into your GI tract during the procedure and reduce the bloating. If you had a lower endoscopy (such as a colonoscopy or flexible sigmoidoscopy) you may notice spotting of blood in your stool or on the toilet paper. If you underwent a bowel prep for your procedure, then you may not have a normal bowel movement for a few days.  DIET: Your first meal following the procedure should be a light meal and then it is ok to progress to your normal diet.  A half-sandwich or bowl of soup is an example of a good first meal.  Heavy or fried foods are harder to digest and may make you feel nauseous or bloated.  Likewise meals heavy in dairy and vegetables can cause extra gas to form and this can also increase the bloating.  Drink  plenty of fluids but you should avoid alcoholic beverages for 24 hours.  ACTIVITY: Your care partner should take you home directly after the procedure.  You should plan to take it easy, moving slowly for the rest of the day.  You can resume normal activity the day after the procedure however you should NOT DRIVE or use heavy machinery for 24 hours (because of the sedation medicines used during the test).    SYMPTOMS TO REPORT IMMEDIATELY: A gastroenterologist can be reached at any hour.  During normal business hours, 8:30 AM to 5:00 PM Monday through Friday, call [phone].  After hours and on weekends, please call the GI answering service at [phone] who will take a message and have the physician on call contact you.   Following lower endoscopy (colonoscopy or flexible sigmoidoscopy):  Excessive amounts of blood in the stool  Significant tenderness or worsening of abdominal pains  Swelling of the abdomen that is new, acute  Fever of 100F or higher  Following upper endoscopy (EGD)  Vomiting of blood or coffee ground material  New chest pain or pain under the shoulder blades  Painful or persistently difficult swallowing  New shortness of breath  Fever of 100F or higher  Black, tarry-looking stools  FOLLOW UP: If any biopsies were taken you will be contacted by phone or by letter within the next 1-3 weeks.  Call your gastroenterologist if you have not heard  about the biopsies in 3 weeks.  Our staff will call the home number listed on your records the next business day following your procedure to check on you and address any questions or concerns that you may have at that time regarding the information given to you following your procedure. This is a courtesy call and so if there is no answer at the home number and we have not heard from you through the emergency physician on call, we will assume that you have returned to your regular daily activities without  incident.  SIGNATURES/CONFIDENTIALITY: You and/or your care partner have signed paperwork which will be entered into your electronic medical record.  These signatures attest to the fact that that the information above on your After Visit Summary has been reviewed and is understood.  Full responsibility of the confidentiality of this discharge information lies with you and/or your care-partner.     Resume medications. Information given on polyps, diverticulosis and hiatal hernia with discharge instructions.

## 2013-06-28 NOTE — Progress Notes (Signed)
Procedure ends, to recovery, report given and VSS. 

## 2013-06-28 NOTE — Progress Notes (Signed)
Patient did not experience any of the following events: a burn prior to discharge; a fall within the facility; wrong site/side/patient/procedure/implant event; or a hospital transfer or hospital admission upon discharge from the facility. (G8907) Patient did not have preoperative order for IV antibiotic SSI prophylaxis. (G8918)  

## 2013-06-28 NOTE — Progress Notes (Signed)
Pt. Denies drinking liquids after 0700 this AM. She states she mispoke earlier.

## 2013-06-28 NOTE — Op Note (Signed)
[facility] [address] [place], [address]   ENDOSCOPY PROCEDURE REPORT  PATIENT: Valerie, Davis  MR#: [id number] BIRTHDATE: [birth year] , 71  yrs. old GENDER: Female ENDOSCOPIST: Iva Boop, MD, [facility] PROCEDURE DATE:  06/28/2013 PROCEDURE:  EGD, diagnostic ASA CLASS:     Class II INDICATIONS:  Heme positive stool.   Iron deficiency anemia. MEDICATIONS: Propofol (Diprivan) 120 mg IV, MAC sedation, administered by CRNA, and These medications were titrated to patient response per physician's verbal order TOPICAL ANESTHETIC: Cetacaine Spray  DESCRIPTION OF PROCEDURE: After the risks benefits and alternatives of the procedure were thoroughly explained, informed consent was obtained.  The LB [id number] endoscope was introduced through the mouth and advanced to the second portion of the duodenum. Without limitations.  The instrument was slowly withdrawn as the mucosa was fully examined.       STOMACH: A 10 cm hiatal hernia was noted.   Multiple small linear ulcers were found in the gastric body - where diaphragm impinges upon stomach..  The remainder of the upper endoscopy exam was otherwise normal. Retroflexed views revealed a hiatal hernia.     The scope was then withdrawn from the patient and the procedure completed.  COMPLICATIONS: There were no complications. ENDOSCOPIC IMPRESSION: 1.   10 cm hiatal hernia 2.   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   The remainder of the upper endoscopy exam was otherwise normal  RECOMMENDATIONS: Proceed with a Colonoscopy. Schedule Upper GI series to evaluate hiatal hernia further  eSigned:  Iva Boop, MD, [facility] 06/28/2013 9:53 AM  CC:W. Buren Kos, MD and The Patient

## 2013-06-29 ENCOUNTER — Telehealth: Payer: Self-pay | Admitting: *Deleted

## 2013-06-29 NOTE — Telephone Encounter (Signed)
  Follow up Call-  Call back number 06/28/2013  Post procedure Call Back phone  # [phone]  Permission to leave phone message Yes     Patient questions:  Do you have a fever, pain , or abdominal swelling? no Pain Score  0 *  Have you tolerated food without any problems? yes  Have you been able to return to your normal activities? yes  Do you have any questions about your discharge instructions: Diet   no Medications  no Follow up visit  no  Do you have questions or concerns about your Care? no  Actions: * If pain score is 4 or above: No action needed, pain <4.

## 2013-06-30 ENCOUNTER — Telehealth: Payer: Self-pay

## 2013-06-30 ENCOUNTER — Other Ambulatory Visit: Payer: Self-pay

## 2013-06-30 DIAGNOSIS — K449 Diaphragmatic hernia without obstruction or gangrene: Secondary | ICD-10-CM

## 2013-06-30 NOTE — Telephone Encounter (Signed)
UGIS scheduled at [facility], July 06, 2013 @ 9:30am. NPO after midnight, arrive 9:15am

## 2013-06-30 NOTE — Telephone Encounter (Signed)
Patient aware of appt date and time and instructions

## 2013-06-30 NOTE — Telephone Encounter (Signed)
Message copied by Annett Fabian on Thu Jun 30, 2013  3:00 PM ------      Message from: Iva Boop      Created: Tue Jun 28, 2013  4:55 PM      Regarding: UGIS       See EGD note      Needs UGIS to evaluate hiatal hernia ------

## 2013-07-06 ENCOUNTER — Encounter: Payer: Self-pay | Admitting: Internal Medicine

## 2013-07-06 ENCOUNTER — Ambulatory Visit (HOSPITAL_COMMUNITY)
Admission: RE | Admit: 2013-07-06 | Discharge: 2013-07-06 | Disposition: A | Discharge status: Home / Self Care | Payer: 59 | Source: Ambulatory Visit | Attending: Internal Medicine | Admitting: Internal Medicine

## 2013-07-06 DIAGNOSIS — D126 Benign neoplasm of colon, unspecified: Secondary | ICD-10-CM | POA: Insufficient documentation

## 2013-07-06 DIAGNOSIS — K224 Dyskinesia of esophagus: Secondary | ICD-10-CM | POA: Diagnosis not present

## 2013-07-06 DIAGNOSIS — K449 Diaphragmatic hernia without obstruction or gangrene: Secondary | ICD-10-CM

## 2013-07-06 DIAGNOSIS — Z860100 Personal history of colon polyps, unspecified: Secondary | ICD-10-CM

## 2013-07-06 DIAGNOSIS — D649 Anemia, unspecified: Secondary | ICD-10-CM | POA: Insufficient documentation

## 2013-07-06 DIAGNOSIS — K5900 Constipation, unspecified: Secondary | ICD-10-CM | POA: Diagnosis not present

## 2013-07-06 HISTORY — DX: Personal history of colonic polyps: Z86.010

## 2013-07-06 HISTORY — DX: Personal history of colon polyps, unspecified: Z86.0100

## 2013-07-06 NOTE — Progress Notes (Signed)
Quick Note:  Large hiatal hernia as suspected Could benefit from surgical repair She can schedule REV so we can discuss or if inclined we can schedule surgery consult  Polyp from colon benign - she will get a letter ______

## 2013-07-06 NOTE — Progress Notes (Signed)
Quick Note:  Diminutive adenoma Repeat colonoscopy 5 years - 2019 ______

## 2013-07-07 ENCOUNTER — Other Ambulatory Visit: Payer: Self-pay

## 2013-07-07 DIAGNOSIS — K449 Diaphragmatic hernia without obstruction or gangrene: Secondary | ICD-10-CM

## 2013-07-27 ENCOUNTER — Encounter (INDEPENDENT_AMBULATORY_CARE_PROVIDER_SITE_OTHER): Payer: Self-pay | Admitting: Surgery

## 2013-07-27 ENCOUNTER — Ambulatory Visit (INDEPENDENT_AMBULATORY_CARE_PROVIDER_SITE_OTHER): Payer: 59 | Admitting: Surgery

## 2013-07-27 VITALS — BP 138/82 | HR 84 | Temp 99.0°F | Resp 14 | Ht 63.0 in | Wt 165.4 lb

## 2013-07-27 DIAGNOSIS — K449 Diaphragmatic hernia without obstruction or gangrene: Secondary | ICD-10-CM

## 2013-07-27 NOTE — Patient Instructions (Signed)
Nissen Fundoplication Care After Please read the instructions outlined below and refer to this sheet for the next few weeks. These discharge instructions provide you with general information on caring for yourself after you leave the hospital. Your doctor may also give you specific instructions. While your treatment has been planned according to the most current medical practices available, unavoidable complications sometimes happen. If you have any problems or questions after discharge, please call your doctor. ACTIVITY  Take frequent rest periods throughout the day.  Take frequent walks throughout the day. This will help to prevent blood clots.  Continue to do your coughing and deep breathing exercises once you get home. This will help to prevent pneumonia.  No strenuous activities such as heavy lifting, pushing or pulling until after your follow-up visit with your doctor. Do not lift anything heavier than 10 pounds.  Talk with your caregiver about when you may return to work and your exercise routine.  You may shower 2 days after surgery. Pat incisions dry. Do not rub incisions with washcloth or towel.  Do not drive while taking prescription pain medication. NUTRITION  Continue with a liquid diet, or the diet you were directed to take, until your first follow-up visit with your surgeon.  Drink fluids (6-8 glasses a day).  Call your caregiver for persistent nausea (feeling sick to your stomach), vomiting, bloating or difficulty swallowing. ELIMINATION It is very important not to strain during bowel movements. If constipation should occur, you may:  Take a mild laxative (such as Milk of Magnesia).  Add fruit and bran to your diet.  Drink more fluids.  Call your caregiver if constipation is not relieved. FEVER If you feel feverish or have shaking chills, take your temperature. If it is 102 F (38.9 C) or above, call your caregiver. The fever may mean there is an infection. PAIN  CONTROL  If a prescription was given for a pain reliever, please follow your caregiver's directions.  Only take over-the-counter or prescription medicines for pain, discomfort, or fever as directed by your caregiver.  If the pain is not relieved by your medicine, becomes worse, or you have difficulty breathing, call your doctor. INCISION  It is normal for your cuts (incisions) from surgery to have a small amount of drainage for the first 1-2 days. Once the drainage has stopped, leave your incision(s) open to air.  Check your incision(s) and surrounding area daily for any redness, swelling, increased drainage or bleeding. If any of these are present or if the wound edges start to separate, call your doctor.  If you have small adhesive strips in place, they will peel and fall off. (If these strips are covered with a clear bandage, your doctor will tell you when to remove them.)  If you have staples, your caregiver will remove them at the follow-up appointment. Document Released: 06/26/2004 Document Revised: 01/26/2012 Document Reviewed: 09/30/2007 Exit[employer] Patient Information 2014 [employer].  

## 2013-07-27 NOTE — Progress Notes (Signed)
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 female a remote history of GERD requiring transfusions down in [place] who is socially removed home 2 [place] and has had problems with iron deficiency anemia. She's been scoped and managed by Dr. Aundra Dubin. A reviewed her symptoms and discussed laparoscopic repair of her hiatal hernia Nissen fundoplication possibly in detail. She is eager to go ahead and bar on this as soon as possible. I gave her a booklet on this. She's aware of the risks and benefits.  Past Medical History  Diagnosis Date  . Iron deficiency anemia   . Insomnia   . Trigeminal neuralgia   . Hypothyroidism   . Hypertension   . H/O: GI bleed     erosive gastritis (NSAID) 2007 in [facility]  . ADD (attention deficit disorder)   . Depression   . GERD (gastroesophageal reflux disease)   . Osteopenia   . Diverticulosis   . Asthma   . Personal history of colonic adenoma 07/06/2013  . Arthritis   . Blood transfusion without reported diagnosis     Past Surgical History  Procedure Laterality Date  . Vaginal hysterectomy    . Bilateral salpingoophorectomy    . Knee menscectomy Right 07/2009  . Colonoscopy    . Upper gastrointestinal endoscopy    . Abdominal hysterectomy      Current Outpatient Prescriptions  Medication Sig Dispense Refill  . amphetamine-dextroamphetamine (ADDERALL) 20 MG tablet Take 20 mg by mouth 3 (three) times daily.      . celecoxib (CELEBREX) 200 MG capsule Take 200 mg by mouth 2 (two) times daily.      . cetirizine (ZYRTEC) 10 MG tablet Take 10 mg by mouth daily.      . Cholecalciferol (VITAMIN D) 2000 UNITS CAPS Take 1 capsule by mouth daily.      . cyanocobalamin (,VITAMIN B-12,) 1000 MCG/ML injection Inject 1 mL (1,000 mcg total) into the muscle once.  1 mL  0  . DULoxetine (CYMBALTA) 60 MG capsule One tablet by mouth once daily      . ferrous sulfate 325 (65  FE) MG tablet Take 1 tablet (325 mg total) by mouth daily.      Marland Kitchen levothyroxine (SYNTHROID, LEVOTHROID) 88 MCG tablet Take 88 mcg by mouth daily.      Marland Kitchen losartan-hydrochlorothiazide (HYZAAR) 100-12.5 MG per tablet Take 1 tablet by mouth daily.      Marland Kitchen omeprazole (PRILOSEC) 20 MG capsule Take 1 capsule (20 mg total) by mouth daily.  30 capsule  11  . zolpidem (AMBIEN) 10 MG tablet Take 1 tablet (10 mg total) by mouth at bedtime as needed for sleep.  30 tablet  0   No current facility-administered medications for this visit.   Ace inhibitors; Amlodipine besylate; Bupropion hcl; Levofloxacin; and Pantoprazole sodium Family History  Problem Relation Age of Onset  . Heart attack Father 42  . Colon cancer Neg Hx   . Rectal cancer Neg Hx   . Stroke Mother   . Stomach cancer Maternal Grandfather   . Cancer Sister     sarcoma   Social History:   reports that she quit smoking about 15 years ago. She has never used smokeless tobacco. She reports that  drinks alcohol. She reports that she does not use illicit drugs.   REVIEW OF SYSTEMS - PERTINENT POSITIVES ONLY: significan in that 2 sibs  have had synovial cell sarcoma and multiple mom./works at [employer]  Physical Exam:   Blood pressure 138/82, pulse 84, temperature 99 F (37.2 C), temperature source Temporal, resp. rate 14, height 5\' 3"  (1.6 m), weight 165 lb 6.4 oz (75.025 kg). Body mass index is 29.31 kg/(m^2).  Gen:  WDWN WF NAD  Neurological: Alert and oriented to person, place, and time. Motor and sensory function is grossly intact  Head: Normocephalic and atraumatic.  Eyes: Conjunctivae are normal. Pupils are equal, round, and reactive to light. No scleral icterus.  Neck: Normal range of motion. Neck supple. No tracheal deviation or thyromegaly present.  Cardiovascular:  SR without murmurs or gallops.  No carotid bruits Respiratory: Effort normal.  No respiratory distress. No chest wall tenderness. Breath sounds normal.  No wheezes, rales  or rhonchi.  Abdomen:  nontender GU: Musculoskeletal: Normal range of motion. Extremities are nontender. No cyanosis, edema or clubbing noted Lymphadenopathy: No cervical, preauricular, postauricular or axillary adenopathy is present Skin: Skin is warm and dry. No rash noted. No diaphoresis. No erythema. No pallor. Pscyh: Normal mood and affect. Behavior is normal. Judgment and thought content normal.   LABORATORY RESULTS: No results found for this or any previous visit (from the past 48 hour(s)).  RADIOLOGY RESULTS: No results found.  Problem List: Patient Active Problem List   Diagnosis Date Noted  . Personal history of colonic adenoma 07/06/2013  . INSOMNIA-SLEEP DISORDER-UNSPEC 09/05/2010  . NEURALGIA, TRIGEMINAL 06/06/2010  . HYPOTHYROIDISM 04/26/2010  . ANEMIA-IRON DEFICIENCY 04/26/2010  . HYPERTENSION 04/26/2010  . 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 Daphine Deutscher, MD, [facility] [phone] beeper [phone]  07/27/2013 12:42 PM

## 2013-08-16 ENCOUNTER — Encounter (INDEPENDENT_AMBULATORY_CARE_PROVIDER_SITE_OTHER): Payer: Self-pay

## 2013-08-19 ENCOUNTER — Inpatient Hospital Stay (HOSPITAL_COMMUNITY)
Admission: AD | Admit: 2013-08-19 | Discharge: 2013-08-20 | DRG: 812 | Disposition: A | Discharge status: Home / Self Care | Payer: 59 | Source: Ambulatory Visit | Attending: Internal Medicine | Admitting: Internal Medicine

## 2013-08-19 ENCOUNTER — Encounter (HOSPITAL_COMMUNITY): Payer: Self-pay | Admitting: *Deleted

## 2013-08-19 DIAGNOSIS — I10 Essential (primary) hypertension: Secondary | ICD-10-CM | POA: Diagnosis present

## 2013-08-19 DIAGNOSIS — Z8249 Family history of ischemic heart disease and other diseases of the circulatory system: Secondary | ICD-10-CM

## 2013-08-19 DIAGNOSIS — R0602 Shortness of breath: Secondary | ICD-10-CM | POA: Diagnosis present

## 2013-08-19 DIAGNOSIS — K449 Diaphragmatic hernia without obstruction or gangrene: Secondary | ICD-10-CM | POA: Diagnosis present

## 2013-08-19 DIAGNOSIS — Z823 Family history of stroke: Secondary | ICD-10-CM | POA: Diagnosis not present

## 2013-08-19 DIAGNOSIS — Z8701 Personal history of pneumonia (recurrent): Secondary | ICD-10-CM | POA: Diagnosis not present

## 2013-08-19 DIAGNOSIS — E538 Deficiency of other specified B group vitamins: Secondary | ICD-10-CM | POA: Diagnosis present

## 2013-08-19 DIAGNOSIS — R5381 Other malaise: Secondary | ICD-10-CM | POA: Diagnosis present

## 2013-08-19 DIAGNOSIS — D518 Other vitamin B12 deficiency anemias: Secondary | ICD-10-CM | POA: Diagnosis present

## 2013-08-19 DIAGNOSIS — D500 Iron deficiency anemia secondary to blood loss (chronic): Principal | ICD-10-CM | POA: Diagnosis present

## 2013-08-19 DIAGNOSIS — I951 Orthostatic hypotension: Secondary | ICD-10-CM | POA: Diagnosis present

## 2013-08-19 DIAGNOSIS — K922 Gastrointestinal hemorrhage, unspecified: Secondary | ICD-10-CM | POA: Diagnosis present

## 2013-08-19 DIAGNOSIS — Z87891 Personal history of nicotine dependence: Secondary | ICD-10-CM | POA: Diagnosis not present

## 2013-08-19 DIAGNOSIS — K259 Gastric ulcer, unspecified as acute or chronic, without hemorrhage or perforation: Secondary | ICD-10-CM | POA: Diagnosis present

## 2013-08-19 DIAGNOSIS — E039 Hypothyroidism, unspecified: Secondary | ICD-10-CM | POA: Diagnosis present

## 2013-08-19 DIAGNOSIS — D509 Iron deficiency anemia, unspecified: Secondary | ICD-10-CM | POA: Diagnosis present

## 2013-08-19 DIAGNOSIS — Z860100 Personal history of colon polyps, unspecified: Secondary | ICD-10-CM

## 2013-08-19 DIAGNOSIS — R55 Syncope and collapse: Secondary | ICD-10-CM | POA: Diagnosis present

## 2013-08-19 DIAGNOSIS — M899 Disorder of bone, unspecified: Secondary | ICD-10-CM | POA: Diagnosis present

## 2013-08-19 LAB — COMPREHENSIVE METABOLIC PANEL
ALT: 12 U/L (ref 0–35)
AST: 18 U/L (ref 0–37)
BUN: 38 mg/dL — ABNORMAL HIGH (ref 6–23)
Chloride: 97 mEq/L (ref 96–112)
Creatinine, Ser: 1.74 mg/dL — ABNORMAL HIGH (ref 0.50–1.10)
GFR calc Af Amer: 33 mL/min — ABNORMAL LOW (ref >90)
GFR calc non Af Amer: 29 mL/min — ABNORMAL LOW (ref >90)
Glucose, Bld: 96 mg/dL (ref 70–99)
Potassium: 3.5 mEq/L (ref 3.5–5.1)
Sodium: 136 mEq/L (ref 135–145)
Total Protein: 7.2 g/dL (ref 6.0–8.3)

## 2013-08-19 LAB — CBC
HCT: 21.5 % — ABNORMAL LOW (ref 36.0–46.0)
Hemoglobin: 6.3 g/dL — CL (ref 12.0–15.0)
RDW: 17.1 % — ABNORMAL HIGH (ref 11.5–15.5)
WBC: 6.2 10*3/uL (ref 4.0–10.5)

## 2013-08-19 LAB — IRON AND TIBC

## 2013-08-19 LAB — FOLATE: Folate: >20 ng/mL

## 2013-08-19 LAB — RETICULOCYTES
Retic Count, Absolute: 77 10*3/uL (ref 19.0–186.0)
Retic Ct Pct: 2.7 % (ref 0.4–3.1)

## 2013-08-19 LAB — PREPARE RBC (CROSSMATCH)

## 2013-08-19 LAB — ABO/RH: ABO/RH(D): O POS

## 2013-08-19 LAB — VITAMIN B12: Vitamin B-12: 693 pg/mL (ref 211–911)

## 2013-08-19 MED ORDER — ACETAMINOPHEN 325 MG PO TABS
650.0000 mg | ORAL_TABLET | Freq: Once | ORAL | Status: AC
  Administered 2013-08-19: 650 mg via ORAL
  Filled 2013-08-19: qty 2

## 2013-08-19 MED ORDER — SODIUM CHLORIDE 0.9 % IV SOLN
25.0000 mg | Freq: Once | INTRAVENOUS | Status: AC
  Administered 2013-08-19: 25 mg via INTRAVENOUS
  Filled 2013-08-19: qty 0.5

## 2013-08-19 MED ORDER — ONDANSETRON HCL 4 MG/2ML IJ SOLN: 4.0000 mg | Freq: Four times a day (QID) | INTRAMUSCULAR | Status: DC | PRN

## 2013-08-19 MED ORDER — ACETAMINOPHEN 325 MG PO TABS
650.0000 mg | ORAL_TABLET | Freq: Four times a day (QID) | ORAL | Status: DC | PRN
  Administered 2013-08-20: 650 mg via ORAL
  Filled 2013-08-19: qty 2

## 2013-08-19 MED ORDER — LORATADINE 10 MG PO TABS
10.0000 mg | ORAL_TABLET | Freq: Every day | ORAL | Status: DC
  Administered 2013-08-20: 10 mg via ORAL
  Filled 2013-08-19: qty 1

## 2013-08-19 MED ORDER — DULOXETINE HCL 60 MG PO CPEP
60.0000 mg | ORAL_CAPSULE | Freq: Every day | ORAL | Status: DC
  Administered 2013-08-20: 60 mg via ORAL
  Filled 2013-08-19: qty 1

## 2013-08-19 MED ORDER — ONDANSETRON HCL 4 MG PO TABS: 4.0000 mg | ORAL_TABLET | Freq: Four times a day (QID) | ORAL | Status: DC | PRN

## 2013-08-19 MED ORDER — DIPHENHYDRAMINE HCL 25 MG PO CAPS
25.0000 mg | ORAL_CAPSULE | Freq: Once | ORAL | Status: AC
  Administered 2013-08-19: 25 mg via ORAL
  Filled 2013-08-19: qty 1

## 2013-08-19 MED ORDER — ALUM & MAG HYDROXIDE-SIMETH 200-200-20 MG/5ML PO SUSP: 30.0000 mL | Freq: Four times a day (QID) | ORAL | Status: DC | PRN

## 2013-08-19 MED ORDER — HYDROCODONE-ACETAMINOPHEN 5-325 MG PO TABS: 1.0000 | ORAL_TABLET | ORAL | Status: DC | PRN

## 2013-08-19 MED ORDER — CYANOCOBALAMIN 1000 MCG/ML IJ SOLN
1000.0000 ug | Freq: Once | INTRAMUSCULAR | Status: AC
  Administered 2013-08-20: 1000 ug via INTRAMUSCULAR
  Filled 2013-08-19: qty 1

## 2013-08-19 MED ORDER — FERROUS SULFATE 325 (65 FE) MG PO TABS
325.0000 mg | ORAL_TABLET | Freq: Every day | ORAL | Status: DC
  Administered 2013-08-20: 325 mg via ORAL
  Filled 2013-08-19: qty 1

## 2013-08-19 MED ORDER — SODIUM CHLORIDE 0.9 % IV SOLN
INTRAVENOUS | Status: DC
  Administered 2013-08-19: 19:00:00 via INTRAVENOUS

## 2013-08-19 MED ORDER — ACETAMINOPHEN 650 MG RE SUPP: 650.0000 mg | Freq: Four times a day (QID) | RECTAL | Status: DC | PRN

## 2013-08-19 MED ORDER — SODIUM CHLORIDE 0.9 % IV SOLN
1000.0000 mg | Freq: Once | INTRAVENOUS | Status: AC
  Administered 2013-08-20: 1000 mg via INTRAVENOUS
  Filled 2013-08-19 (×2): qty 20

## 2013-08-19 MED ORDER — ZOLPIDEM TARTRATE 5 MG PO TABS: 5.0000 mg | ORAL_TABLET | Freq: Every evening | ORAL | Status: DC | PRN

## 2013-08-19 MED ORDER — ZOLPIDEM TARTRATE 10 MG PO TABS: 10.0000 mg | ORAL_TABLET | Freq: Every evening | ORAL | Status: DC | PRN

## 2013-08-19 MED ORDER — LEVOTHYROXINE SODIUM 88 MCG PO TABS
88.0000 ug | ORAL_TABLET | Freq: Every day | ORAL | Status: DC
  Administered 2013-08-20: 88 ug via ORAL
  Filled 2013-08-19 (×2): qty 1

## 2013-08-19 MED ORDER — OMEPRAZOLE 20 MG PO CPDR
20.0000 mg | DELAYED_RELEASE_CAPSULE | Freq: Every day | ORAL | Status: DC
  Administered 2013-08-20: 20 mg via ORAL
  Filled 2013-08-19: qty 1

## 2013-08-19 MED ORDER — VITAMIN D 1000 UNITS PO TABS
1000.0000 [IU] | ORAL_TABLET | Freq: Every day | ORAL | Status: DC
  Administered 2013-08-20: 1000 [IU] via ORAL
  Filled 2013-08-19: qty 1

## 2013-08-19 MED ORDER — PANTOPRAZOLE SODIUM 40 MG PO TBEC: 40.0000 mg | DELAYED_RELEASE_TABLET | Freq: Every day | ORAL | Status: DC

## 2013-08-19 NOTE — H&P (Signed)
Vital Signs  Entered weight:  158  lbs., Calculated Weight: 158 lbs., ( 71.67 kg) Height: 63.50 in., ( 161.29 cm) Temperature: 97.2 deg F, Temperature site: oral Pulse rate: 80 Pulse rhythm: regular Respirations: 14  Blood Pressure #1: 120 / 70 mm Hg , laying Pulse: 88  Blood Pressure #2: 90 / 70 mm Hg , sitting Pulse: 88  Blood Pressure #3: 90 / 60 mm Hg , standing Pulse: 67  BMI: 27.55 BSA: 1.76 Wt Chg: 5.87 lbs since 02/09/2013  Vitals entered by: Carma Lair  on August 19, 2013 4:24 PM Vitals updated by: Carma Lair  on August 19, 2013 4:24 PM       Risk Factors:   Smoked Tobacco Use:  Former smoker       Years Since Last Quit:  28 Passive smoke exposure:  yes Drug use:  no HIV high-risk behavior:  no Caffeine use:  2 drinks per day Exercise:  yes    Times per week:  0    Type of Exercise:  walk Seatbelt use:  100 % Sun Exposure:  occasionally  Previous Tobacco Use: Signed On - 11/15/2012 Smoked Tobacco Use:  former smoker     Pack-years:  2/3 of a ppd       Year quit:  1986       Years Since Last Quit:  28 years, 9 months, 2 days Passive smoke exposure:  yes Drug use:  no HIV high-risk behavior:  no Caffeine use:  2 drinks per day  Previous Alcohol Use:  Exercise:  yes    Times per week:  0    Type of Exercise:  walk Seatbelt use:  100 % Sun Exposure:  occasionally  Colonoscopy History:    Date of Last Colonoscopy:  06/28/2013  Mammogram History:    Date of Last Mammogram:  05/12/2011  PAP Smear History:    Date of Last PAP Smear:  02/04/2011  History of Present Illness  Reason for visit: See chief complaint Chief Complaint: Patient states she is very weak and lightheaded. She states she feels like she is going to faint.  History of Present Illness: Valerie Davis is a 72 year old white female with a history of B12 deficiency, iron deficiency anemia, and recently diagnosed Sheria Lang erosions/Hiatal Hernia who presented to the office today with a  complaint of increasing fatigue and near syncope.  Patient was initially found to be anemic in 12/13 when she had a hemoglobin of 11.4.  Evaluation was consistent with iron deficiency on top of her known B12 deficiency.  She was continued on oral iron and B12 injections monthly.  Follow-up hemoglobin showed no significant improvement.  Hemoccults were obtained and were positive.  She was referred to GI for evaluation and underwent an EGD and colonoscopy on 8/12 which revealed Cameron erosions and a large (10 cm) hiatal hernia.  She did have a tubular adenoma resected.  Was referred to general surgery and is scheduled for hiatal hernia repair on 10/13.  Her preoperative appointment is actually next week.  She called today because she has been feeling progressively more fatigued and weak.  Has felt fatigue, general malaise for the past 3-4 weeks.  Started feeling weaker yesterday- had to leave work.  Today has felt short of breath, near syncopal.  Having 2-3 dark BM per day.  No nausea/vomitting.  No hematemesis.  No abdominal pain.  In office today, her hemoglobin is 6.9 with an MCV of 73.5.  She is being admitted  for further management.   Review of Systems  General:       Complains of fatigue, malaise.        Denies fevers, chills, sweats, weight loss.   Eyes:       Denies vision loss.   Cardiovascular:       Denies chest pains, dyspnea on exertion, peripheral edema.   Respiratory:       Denies dyspnea.   Gastrointestinal:       Complains of see HPI.        Denies diarrhea, constipation, heartburn.   Genitourinary:       Denies urinary frequency.   Musculoskeletal:       diffuse myalgias Skin:       Denies suspicious lesions.   Endocrine:       Denies polydipsia, polyuria.   Heme/Lymphatic:       Complains of see HPI.    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[employer] quit smoking 25 years ago- on and off 2/3 ppd X 22 years; social ETOH; occasional marijuana  Family History Summary:     Reviewed history Last on 02/05/2012 and no changes required:08/19/2013 Father Baker Pierini.) - Has Family History of Heart Disease - Entered On: 08/19/2013  General Comments - FH: Father- MI (d15) Mother- d62 CVA (45s) sister- Sarcoma (29, d24s) 2 brothers- healthy 1 sister- healthy Daughter- healthy  Daughter- ADD, Right frontal AVM s/p surgery-->CVA, PTSD, Bipolar D/O  Social History:    Reviewed history from 02/04/2011 and no changes required:       "Afton"       [employer]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  2 daughters- 1 in [place], 1 in 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[employer]       quit smoking 25 years ago- on and off 2/3 ppd X 22 years; social ETOH; occasional marijuana   Physical Exam  General appearance: pale appearing, weak  Eyes  External: scleral pallor Pupils: equal, round, reactive to light and accommodation  Ears, Nose and Throat  External ears: normal, no lesions or  deformities External nose: normal, no lesions or deformities Hearing: grossly intact Dental: good dentition Pharynx: mild posterior pharynx erythema without tonsillar exudate or enlargement  Neck  Neck: supple, no masses, trachea midline Thyroid: no nodules, masses, tenderness, or enlargement  Respiratory  Respiratory effort: no intercostal retractions or use of accessory muscles Auscultation: no rales, rhonchi, or wheezes  Cardiovascular  Auscultation: S1, S2, no murmur, rub, or gallop Carotid arteries: pulses 2+, symmetric, no bruits Pedal pulses: pulses 2+, symmetric Periph. circulation: no cyanosis, clubbing, edema  Gastrointestinal  Abdomen: soft, non-tender, no masses, bowel sounds normal Liver and spleen:  no enlargement or nodularity  Lymphatic  Neck: no cervical adenopathy  Musculoskeletal  Gait and station: normal Digits and nails: no clubbing, cyanosis, petechiae, or nodes Head and neck: normal alignment and mobility Spine, ribs, pelvis: normal alignment and mobility, no deformity RUE: normal ROM and strength, no joint enlargement or tenderness LUE: normal ROM and strength, no joint enlargement or tenderness RLE: normal ROM and strength, no joint enlargement or tenderness LLE: normal ROM and strength, no joint enlargement or tenderness  Skin  Inspection: pale  Neurologic  Reflexes: 2+, symmetric  Mental Status Exam  Judgment, insight: intact Mood and affect: Normal Mood  Labs:  WBC                       7.50 K/uL                   4.10-10.90   LYM                       1.3 K/uL                    0.6-4.1 ! MID                       0.6 K/uL                    0.0-1.8   GRAN                      5.6 K/uL                    2.0-7.8   LYM%                      17.1 %                      10.0-58.5 ! MID%                      8.0 %                       0.1-24.0   GRAN%                     74.9                        37.0-92.0   RBC                  [L]   3.0 M/uL                    4.2-6.3   HGB                  [LL] 6.9 g/dL                    16.1-09.6     RES=RESULT VERIFIED AND REPORTED TO PHYSICIAN   HCT                  [LL] 21.9 %                      37.0-51.0     RFV=REPEATED FOR VERIFICATION   MCV                  [  L]  73.5 fL                     80.0-97.0   MCH                  [L]  23.2 pg                     26.0-32.0   MCHC                      31.5 g/dL                   40.9-81.1   PLT                  [H]  465 K/uL                    140-440   Impression & Recommendations:  Problem # 1:  Iron deficiency anemia (ICD-280.9) (ICD10-D50.9) Progressive and profound iron deficiency anemia in the setting of Cameron erosions and upper GI bleed.  She'll be admitted for transfusion due to symptomatic anemia.  We'll try to increase her hemoglobin to at least 9.0 given her upcoming surgery.  We'll give IV iron as well.  Her updated medication list for this problem includes:    Cyanocobalamin 1000 Mcg/ml Inj Soln (Cyanocobalamin) ..... Inject 1cc im monthly    Nu-iron 150 Mg Caps (Polysaccharide iron complex) .Marland Kitchen... Take one tab by mouth daily    Orders: Venipuncture ([id number]) CBC/Platelets ([id number])   Problem # 2:  GI bleed (ICD-578.9) (HQI69-G29.2) Presumably secondary to known Cameron erosions in the setting of her severe hiatal hernia.    Problem # 3:  Hiatal hernia (ICD-553.3) (BMW41-L24.4) She is scheduled for hiatal hernia repair on 10/13 by Dr. Daphine Deutscher.    Problem # 4:  Near syncope (ICD-780.2) (WNU27-O53) Secondary to orthostasis in the setting of her anemia.  Hold antihypertensives and monitor with transfusion.  Problem # 5:  Orthostatic hypotension (ICD-458.0) (ICD10-I95.1) Hold antihypertensives and monitor with transfusion.  Problem # 6:  HYPERTENSION, BENIGN ESSENTIAL (ICD-401.1) (ICD10-I10) Hold antihypertensives.  May resume after transfusion if stable. Her updated medication list for this problem  includes:    Losartan Potassium-hctz 100-12.5 Mg Tabs (Losartan potassium-hctz) ..... One po every day   Problem # 7:  B12 DEFICIENCY (ICD-266.2) (ICD10-E53.8) Continue B12 shots monthly.  We'll give 1 in the hospital.  Complete Medication List: 1)  Syringe Luer Lock 22g X 1" 3 Ml Misc (Syringe/needle (disp)) .... Pt to use for injection of b12 once a month 2)  Cyanocobalamin 1000 Mcg/ml Inj Soln (Cyanocobalamin) .... Inject 1cc im monthly 4)  Celebrex 200 Mg Caps (Celecoxib) .... One po twice a day 5)  Cymbalta 60 Mg Cpep (Duloxetine hcl) .... One po every day 6)  Nu-iron 150 Mg Caps (Polysaccharide iron complex) .... Take one tab by mouth daily 7)  Voltaren 1 % Gel (Diclofenac sodium) .... Apply 2 grams to affected area every six hours as needed 8)  Fluticasone Propionate 50 Mcg/act Susp (Fluticasone propionate) .... One sprays in each nostil twice daily 9)  Losartan Potassium-hctz 100-12.5 Mg Tabs (Losartan potassium-hctz) .... One po every day 10)  Synthroid 88 Mcg Tabs (Levothyroxine sodium) .... One po every day 11)  Adderall 20 Mg Tabs (Amphetamine-dextroamphetamine) .... One tablet by mouth three times a day prn 12)  Ambien 10 Mg Tabs (Zolpidem tartrate) .... One po every night  as needed 13)  Cyanocobalamin 1000 Mcg/ml Inj Soln (Cyanocobalamin) .... Self inject 1ml im once monthly

## 2013-08-19 NOTE — Progress Notes (Signed)
Pharmacy Consult - IV Iron  Labs: Hgb 6.9  A/P: patient admitted and to give IV iron per pharmacy dosing. Goal per Md note is Hgb of 9. Per RN, patient has not received IV iron before so will give test dose prior to 1g IV iron dextran dose if no reactions from test dose.   Hessie Knows, PharmD, BCPS Pager [phone] 08/19/2013 6:41 PM

## 2013-08-20 LAB — CBC
HCT: 26.3 % — ABNORMAL LOW (ref 36.0–46.0)
Hemoglobin: 8.4 g/dL — ABNORMAL LOW (ref 12.0–15.0)
MCHC: 31.5 g/dL (ref 30.0–36.0)
Platelets: 335 10*3/uL (ref 150–400)
RBC: 3.43 MIL/uL — ABNORMAL LOW (ref 3.87–5.11)
WBC: 5.1 10*3/uL (ref 4.0–10.5)

## 2013-08-20 MED ORDER — INFLUENZA VAC SPLIT QUAD 0.5 ML IM SUSP
0.5000 mL | Freq: Once | INTRAMUSCULAR | Status: AC
  Administered 2013-08-20: 0.5 mL via INTRAMUSCULAR
  Filled 2013-08-20: qty 0.5

## 2013-08-20 MED ORDER — FUROSEMIDE 10 MG/ML IJ SOLN
20.0000 mg | Freq: Once | INTRAMUSCULAR | Status: AC
  Administered 2013-08-20: 20 mg via INTRAVENOUS
  Filled 2013-08-20: qty 2

## 2013-08-20 NOTE — Discharge Summary (Signed)
DISCHARGE SUMMARY  Valerie Davis  MR#: [id number]  DOB:1941-10-06  Date of Admission: 08/19/2013 Date of Discharge: 08/20/2013  Attending Physician:Jeremiah Curci A  Patient's FAO:ZHYQ,M DOUGLAS, MD  Consults: none  Discharge Diagnoses: Principal Problem:   ANEMIA-IRON DEFICIENCY Active Problems:   HYPOTHYROIDISM   ANEMIA, B12 DEFICIENCY   HYPERTENSION   Personal history of colonic adenoma   Large type III mixed hiatus hernia   Chronic upper GI bleeding   Sheria Lang ulcer   Orthostasis   Near syncope   Discharge Medications:   Medication List    STOP taking these medications       celecoxib 200 MG capsule  Commonly known as:  CELEBREX      TAKE these medications       AMBIEN 10 MG tablet  Generic drug:  zolpidem  Take 1 tablet (10 mg total) by mouth at bedtime as needed for sleep.     amphetamine-dextroamphetamine 20 MG tablet  Commonly known as:  ADDERALL  Take 20 mg by mouth 3 (three) times daily.     cetirizine 10 MG tablet  Commonly known as:  ZYRTEC  Take 10 mg by mouth daily.     cyanocobalamin 1000 MCG/ML injection  Commonly known as:  (VITAMIN B-12)  Inject 1 mL (1,000 mcg total) into the muscle once.     DULoxetine 60 MG capsule  Commonly known as:  CYMBALTA  One tablet by mouth once daily     ferrous sulfate 325 (65 FE) MG tablet  Take 1 tablet (325 mg total) by mouth daily.     levothyroxine 88 MCG tablet  Commonly known as:  SYNTHROID, LEVOTHROID  Take 88 mcg by mouth daily.     losartan-hydrochlorothiazide 100-12.5 MG per tablet  Commonly known as:  HYZAAR  Take 1 tablet by mouth daily.     omeprazole 20 MG capsule  Commonly known as:  PRILOSEC  Take 1 capsule (20 mg total) by mouth daily.     Vitamin D 2000 UNITS Caps  Take 1 capsule by mouth daily.        Hospital Procedures: No results found.  History of Present Illness: [name] is a 72 year old white female with a history of B12 deficiency, iron deficiency anemia, and  recently diagnosed Sheria Lang erosions/Hiatal Hernia who presented to the office today with a complaint of increasing fatigue and near syncope. Patient was initially found to be anemic in 12/13 when she had a hemoglobin of 11.4. Evaluation was consistent with iron deficiency on top of her known B12 deficiency. She was continued on oral iron and B12 injections monthly. Follow-up hemoglobin showed no significant improvement. Hemoccults were obtained and were positive. She was referred to GI for evaluation and underwent an EGD and colonoscopy on 8/12 which revealed Cameron erosions and a large (10 cm) hiatal hernia. She did have a tubular adenoma resected. Was referred to general surgery and is scheduled for hiatal hernia repair on 10/13. Her preoperative appointment is actually next week.  She called today because she has been feeling progressively more fatigued and weak. Has felt fatigue, general malaise for the past 3-4 weeks. Started feeling weaker yesterday- had to leave work. Today has felt short of breath, near syncopal. Having 2-3 dark BM per day. No nausea/vomitting. No hematemesis. No abdominal pain.  In office today, her hemoglobin is 6.9 with an MCV of 73.5. She is being admitted for further management.   Hospital Course: Patient was admitted transfused 2 units of packed blood cells and the  hemoglobin bumped up to 8.4. She was given IV iron as well because of ongoing losses. It was felt that he was in her best interest transfuse one additional unit of packed red blood cells prior to discharge targeting hemoglobin of 9 as she remains symptomatic from anemia, has ongoing GI blood losses from prior hernia is currently in preparation phase for surgical repair. She tolerated all the transfusions well. She was ambulating in the room and anxious for discharge. She'll followup in the outpatient setting.  Day of Discharge Exam BP 131/97  Pulse 90  Temp(Src) 98 F (36.7 C) (Oral)  Resp 16  Ht 5\' 7"  (1.702  m)  Wt 71.305 kg (157 lb 3.2 oz)  BMI 24.62 kg/m2  SpO2 98%  Physical Exam: General appearance: alert and cooperative Eyes: no scleral icterus Throat: oropharynx moist without erythema Resp: clear to auscultation bilaterally Cardio: regular rate and rhythm Extremities: no clubbing, cyanosis or edema Abdomen is soft nontender Neurologic exam is normal  Discharge Labs:  Recent Labs  08/19/13 1859  NA 136  K 3.5  CL 97  CO2 26  GLUCOSE 96  BUN 38*  CREATININE 1.74*  CALCIUM 9.3    Recent Labs  08/19/13 1859  AST 18  ALT 12  ALKPHOS 85  BILITOT 0.2*  PROT 7.2  ALBUMIN 3.7    Recent Labs  08/19/13 1859 08/20/13 0647  WBC 6.2 5.1  HGB 6.3* 8.4*  HCT 21.5* 26.3*  MCV 75.4* 77.8*  PLT 408* 335   No results found for this basename: CKTOTAL, CKMB, CKMBINDEX, TROPONINI,  in the last 72 hours No results found for this basename: TSH, T4TOTAL, FREET3, T3FREE, THYROIDAB,  in the last 72 hours  Recent Labs  08/19/13 1859  VITAMINB12 693  FOLATE >20.0  FERRITIN 1*  TIBC Not calculated due to Iron <10.  IRON <10*  RETICCTPCT 2.7    Discharge instructions:     Discharge Orders   Future Orders Complete By Expires   Diet - low sodium heart healthy  As directed    Increase activity slowly  As directed       Disposition: Home  Follow-up Appts: Follow-up with Dr. Jacky Kindle at [facility] in 1 week Dr. Clelia Croft to recheck the hemoglobin*.  Call for appointment.  Condition on Discharge: Improved and stable condition  Tests Needing Follow-up: CBC in the office this upcoming week  Signed: Sharaya Boruff A 08/20/2013, 8:35 AM

## 2013-08-20 NOTE — Progress Notes (Signed)
Patient tolerated iron test dose without any complaints. Will hang iron after transfuse blood.

## 2013-08-20 NOTE — Progress Notes (Signed)
Pt D/C home, alert and oriented, no new complains. D/C instructions done, medication administration done. Pt verbalizes understanding

## 2013-08-21 LAB — TYPE AND SCREEN
Antibody Screen: NEGATIVE
Unit division: 0

## 2013-08-22 ENCOUNTER — Other Ambulatory Visit (HOSPITAL_COMMUNITY): Payer: Self-pay | Admitting: Surgery

## 2013-08-22 NOTE — Progress Notes (Signed)
NEED ORDERS ASAP FOR 10-14 -14 SURGERY PRE OP 08-23-13 800 AM

## 2013-08-22 NOTE — Patient Instructions (Addendum)
20 SHYE DOTY  08/22/2013   Your procedure is scheduled on: 08-30-2013  Report to [facility] at 530  AM.  Call this number if you have problems the morning of surgery [phone]   Remember:   Do not eat food or drink liquids :After Midnight.     Take these medicines the morning of surgery with A SIP OF WATER: zyrtec, synthroid, prilosec                                SEE [facility] PREPARING FOR SURGERY SHEET             You may not have any metal on your body including hair pins and piercings  Do not wear jewelry, make-up.  Do not wear lotions, powders, or perfumes. You may wear deodorant.   Men may shave face and neck.  Do not bring valuables to the hospital.  IS NOT RESPONSIBLE FOR VALUEABLES.  Contacts, dentures or bridgework may not be worn into surgery.  Leave suitcase in the car. After surgery it may be brought to your room.  For patients admitted to the hospital, checkout time is 11:00 AM the day of discharge.   Patients discharged the day of surgery will not be allowed to drive home.  Name and phone number of your driver:  Special Instructions: N/A   Please read over the following fact sheets that you were given:   Call Cain Sieve RN pre op nurse if needed [phone]    FAILURE TO FOLLOW THESE INSTRUCTIONS MAY RESULT IN THE CANCELLATION OF YOUR SURGERY.  PATIENT SIGNATURE___________________________________________  NURSE SIGNATURE_____________________________________________

## 2013-08-23 ENCOUNTER — Ambulatory Visit (HOSPITAL_COMMUNITY)
Admission: RE | Admit: 2013-08-23 | Discharge: 2013-08-23 | Disposition: A | Discharge status: Home / Self Care | Payer: 59 | Source: Ambulatory Visit | Attending: Surgery | Admitting: Surgery

## 2013-08-23 ENCOUNTER — Encounter (HOSPITAL_COMMUNITY): Payer: Self-pay | Admitting: Pharmacy Technician

## 2013-08-23 ENCOUNTER — Other Ambulatory Visit: Payer: Self-pay

## 2013-08-23 ENCOUNTER — Encounter (HOSPITAL_COMMUNITY): Payer: Self-pay

## 2013-08-23 ENCOUNTER — Encounter (HOSPITAL_COMMUNITY)
Admission: RE | Admit: 2013-08-23 | Discharge: 2013-08-23 | Disposition: A | Discharge status: Home / Self Care | Payer: 59 | Source: Ambulatory Visit | Attending: Surgery | Admitting: Surgery

## 2013-08-23 DIAGNOSIS — K449 Diaphragmatic hernia without obstruction or gangrene: Secondary | ICD-10-CM | POA: Diagnosis not present

## 2013-08-23 DIAGNOSIS — Z01812 Encounter for preprocedural laboratory examination: Secondary | ICD-10-CM | POA: Diagnosis not present

## 2013-08-23 DIAGNOSIS — I771 Stricture of artery: Secondary | ICD-10-CM | POA: Insufficient documentation

## 2013-08-23 DIAGNOSIS — R9431 Abnormal electrocardiogram [ECG] [EKG]: Secondary | ICD-10-CM | POA: Diagnosis not present

## 2013-08-23 LAB — BASIC METABOLIC PANEL
BUN: 25 mg/dL — ABNORMAL HIGH (ref 6–23)
Chloride: 98 mEq/L (ref 96–112)
Creatinine, Ser: 1.4 mg/dL — ABNORMAL HIGH (ref 0.50–1.10)
GFR calc non Af Amer: 37 mL/min — ABNORMAL LOW (ref >90)
Glucose, Bld: 104 mg/dL — ABNORMAL HIGH (ref 70–99)
Potassium: 3.1 mEq/L — ABNORMAL LOW (ref 3.5–5.1)

## 2013-08-23 LAB — CBC
HCT: 34.2 % — ABNORMAL LOW (ref 36.0–46.0)
Hemoglobin: 10.7 g/dL — ABNORMAL LOW (ref 12.0–15.0)
MCH: 25.2 pg — ABNORMAL LOW (ref 26.0–34.0)
MCHC: 31.3 g/dL (ref 30.0–36.0)
MCV: 80.5 fL (ref 78.0–100.0)
Platelets: 349 10*3/uL (ref 150–400)
RDW: 18 % — ABNORMAL HIGH (ref 11.5–15.5)

## 2013-08-23 NOTE — Progress Notes (Signed)
Cbc, bmet ekg and chest xray done at pre op visit 08-23-13

## 2013-08-23 NOTE — Progress Notes (Signed)
bmet results sent to dr Daphine Deutscher inbox by epic

## 2013-08-27 ENCOUNTER — Other Ambulatory Visit (INDEPENDENT_AMBULATORY_CARE_PROVIDER_SITE_OTHER): Payer: Self-pay | Admitting: Surgery

## 2013-08-29 NOTE — Anesthesia Preprocedure Evaluation (Addendum)
Anesthesia Evaluation  Patient identified by MRN, date of birth, ID band Patient awake    Reviewed: Allergy & Precautions, H&P , NPO status , Patient's Chart, lab work & pertinent test results  Airway Mallampati: II TM Distance: >3 FB Neck ROM: full    Dental no notable dental hx. (+) Teeth Intact and Dental Advisory Given   Pulmonary neg pulmonary ROS,  breath sounds clear to auscultation  Pulmonary exam normal       Cardiovascular Exercise Tolerance: Good hypertension, Pt. on medications Rhythm:regular Rate:Normal     Neuro/Psych Trigeminal neuralgia negative neurological ROS  negative psych ROS   GI/Hepatic negative GI ROS, Neg liver ROS, hiatal hernia, GERD-  Medicated and Controlled,Chronic upper GI bleeding with anemia   Endo/Other  negative endocrine ROSHypothyroidism   Renal/GU negative Renal ROS  negative genitourinary   Musculoskeletal   Abdominal   Peds  Hematology negative hematology ROS (+) Blood dyscrasia, anemia , hgb 10.7   Anesthesia Other Findings   Reproductive/Obstetrics negative OB ROS                          Anesthesia Physical Anesthesia Plan  ASA: II  Anesthesia Plan: General   Post-op Pain Management:    Induction: Intravenous  Airway Management Planned: Oral ETT  Additional Equipment:   Intra-op Plan:   Post-operative Plan: Extubation in OR  Informed Consent: I have reviewed the patients History and Physical, chart, labs and discussed the procedure including the risks, benefits and alternatives for the proposed anesthesia with the patient or authorized representative who has indicated his/her understanding and acceptance.   Dental Advisory Given  Plan Discussed with: CRNA and Surgeon  Anesthesia Plan Comments:        Anesthesia Quick Evaluation

## 2013-08-29 NOTE — H&P (Signed)
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 female a remote history of GERD requiring transfusions down in [place] who has moved to   [place] and has had problems with iron deficiency anemia. She's been scoped and managed by Dr. Stan Head. I  reviewed her symptoms and discussed laparoscopic repair of her hiatal hernia with  Nissen fundoplication  in detail. She is eager to go ahead and proceed with this surgery on this as soon as possible. I gave her a booklet on this. She's aware of the risks and benefits of this procedure which many have to be done open  Past Medical History   Diagnosis  Date   .  Iron deficiency anemia    .  Insomnia    .  Trigeminal neuralgia    .  Hypothyroidism    .  Hypertension    .  H/O: GI bleed      erosive gastritis (NSAID) 2007 in [facility]   .  ADD (attention deficit disorder)    .  Depression    .  GERD (gastroesophageal reflux disease)    .  Osteopenia    .  Diverticulosis    .  Asthma    .  Personal history of colonic adenoma  07/06/2013   .  Arthritis    .  Blood transfusion without reported diagnosis     Past Surgical History   Procedure  Laterality  Date   .  Vaginal hysterectomy     .  Bilateral salpingoophorectomy     .  Knee menscectomy  Right  07/2009   .  Colonoscopy     .  Upper gastrointestinal endoscopy     .  Abdominal hysterectomy      Current Outpatient Prescriptions   Medication  Sig  Dispense  Refill   .  amphetamine-dextroamphetamine (ADDERALL) 20 MG tablet  Take 20 mg by mouth 3 (three) times daily.     .  celecoxib (CELEBREX) 200 MG capsule  Take 200 mg by mouth 2 (two) times daily.     .  cetirizine (ZYRTEC) 10 MG tablet  Take 10 mg by mouth daily.     .  Cholecalciferol (VITAMIN D) 2000 UNITS CAPS  Take 1 capsule by mouth daily.     .  cyanocobalamin (,VITAMIN B-12,) 1000 MCG/ML injection  Inject 1 mL (1,000 mcg total) into the muscle  once.  1 mL  0   .  DULoxetine (CYMBALTA) 60 MG capsule  One tablet by mouth once daily     .  ferrous sulfate 325 (65 FE) MG tablet  Take 1 tablet (325 mg total) by mouth daily.     Marland Kitchen  levothyroxine (SYNTHROID, LEVOTHROID) 88 MCG tablet  Take 88 mcg by mouth daily.     Marland Kitchen  losartan-hydrochlorothiazide (HYZAAR) 100-12.5 MG per tablet  Take 1 tablet by mouth daily.     Marland Kitchen  omeprazole (PRILOSEC) 20 MG capsule  Take 1 capsule (20 mg total) by mouth daily.  30 capsule  11   .  zolpidem (AMBIEN) 10 MG tablet  Take 1 tablet (10 mg total) by mouth at bedtime as needed for sleep.  30 tablet  0    No current facility-administered medications for this visit.   Ace inhibitors; Amlodipine besylate; Bupropion hcl; Levofloxacin; and Pantoprazole sodium  Family History   Problem  Relation  Age of Onset   .  Heart attack  Father  40   .  Colon cancer  Neg Hx    .  Rectal cancer  Neg Hx    .  Stroke  Mother    .  Stomach cancer  Maternal Grandfather    .  Cancer  Sister      sarcoma   Social History: reports that she quit smoking about 15 years ago. She has never used smokeless tobacco. She reports that drinks alcohol. She reports that she does not use illicit drugs.  REVIEW OF SYSTEMS - PERTINENT POSITIVES ONLY:  significan in that 2 sibs have had synovial cell sarcoma and multiple mom./works at [employer]  Physical Exam:  Blood pressure 138/82, pulse 84, temperature 99 F (37.2 C), temperature source Temporal, resp. rate 14, height 5\' 3"  (1.6 m), weight 165 lb 6.4 oz (75.025 kg).  Body mass index is 29.31 kg/(m^2).  Gen: WDWN WF NAD  Neurological: Alert and oriented to person, place, and time. Motor and sensory function is grossly intact  Head: Normocephalic and atraumatic.  Eyes: Conjunctivae are normal. Pupils are equal, round, and reactive to light. No scleral icterus.  Neck: Normal range of motion. Neck supple. No tracheal deviation or thyromegaly present.  Cardiovascular: SR without murmurs or gallops.  No carotid bruits  Respiratory: Effort normal. No respiratory distress. No chest wall tenderness. Breath sounds normal. No wheezes, rales or rhonchi.  Abdomen: nontender  GU:  Musculoskeletal: Normal range of motion. Extremities are nontender. No cyanosis, edema or clubbing noted Lymphadenopathy: No cervical, preauricular, postauricular or axillary adenopathy is present Skin: Skin is warm and dry. No rash noted. No diaphoresis. No erythema. No pallor. Pscyh: Normal mood and affect. Behavior is normal. Judgment and thought content normal.  LABORATORY RESULTS:  No results found for this or any previous visit (from the past 48 hour(s)).  RADIOLOGY RESULTS:  No results found.  Problem List:  Patient Active Problem List    Diagnosis  Date Noted   .  Personal history of colonic adenoma  07/06/2013   .  INSOMNIA-SLEEP DISORDER-UNSPEC  09/05/2010   .  NEURALGIA, TRIGEMINAL  06/06/2010   .  HYPOTHYROIDISM  04/26/2010   .  ANEMIA-IRON DEFICIENCY  04/26/2010   .  HYPERTENSION  04/26/2010   .  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 Daphine Deutscher, MD, [facility]  [phone] beeper  [phone]

## 2013-08-30 ENCOUNTER — Encounter (HOSPITAL_COMMUNITY): Payer: Self-pay | Admitting: *Deleted

## 2013-08-30 ENCOUNTER — Encounter (HOSPITAL_COMMUNITY): Payer: 59 | Admitting: Anesthesiology

## 2013-08-30 ENCOUNTER — Encounter (HOSPITAL_COMMUNITY)
Admission: RE | Disposition: A | Discharge status: Home / Self Care | Payer: Self-pay | Source: Ambulatory Visit | Attending: Surgery

## 2013-08-30 ENCOUNTER — Inpatient Hospital Stay (HOSPITAL_COMMUNITY): Payer: 59 | Admitting: Anesthesiology

## 2013-08-30 ENCOUNTER — Inpatient Hospital Stay (HOSPITAL_COMMUNITY)
Admission: RE | Admit: 2013-08-30 | Discharge: 2013-09-01 | DRG: 328 | Disposition: A | Discharge status: Home / Self Care | Payer: 59 | Source: Ambulatory Visit | Attending: Surgery | Admitting: Surgery

## 2013-08-30 DIAGNOSIS — Z87891 Personal history of nicotine dependence: Secondary | ICD-10-CM

## 2013-08-30 DIAGNOSIS — K449 Diaphragmatic hernia without obstruction or gangrene: Secondary | ICD-10-CM

## 2013-08-30 DIAGNOSIS — E039 Hypothyroidism, unspecified: Secondary | ICD-10-CM | POA: Diagnosis not present

## 2013-08-30 DIAGNOSIS — K219 Gastro-esophageal reflux disease without esophagitis: Secondary | ICD-10-CM | POA: Diagnosis present

## 2013-08-30 DIAGNOSIS — Z79899 Other long term (current) drug therapy: Secondary | ICD-10-CM

## 2013-08-30 DIAGNOSIS — I10 Essential (primary) hypertension: Secondary | ICD-10-CM | POA: Diagnosis present

## 2013-08-30 DIAGNOSIS — J45909 Unspecified asthma, uncomplicated: Secondary | ICD-10-CM | POA: Diagnosis present

## 2013-08-30 DIAGNOSIS — D649 Anemia, unspecified: Secondary | ICD-10-CM | POA: Diagnosis present

## 2013-08-30 DIAGNOSIS — F988 Other specified behavioral and emotional disorders with onset usually occurring in childhood and adolescence: Secondary | ICD-10-CM | POA: Diagnosis present

## 2013-08-30 DIAGNOSIS — Z9889 Other specified postprocedural states: Secondary | ICD-10-CM

## 2013-08-30 HISTORY — PX: HIATAL HERNIA REPAIR: SHX195

## 2013-08-30 LAB — CBC
HCT: 29.9 % — ABNORMAL LOW (ref 36.0–46.0)
MCHC: 31.4 g/dL (ref 30.0–36.0)
MCV: 83.5 fL (ref 78.0–100.0)
Platelets: 228 10*3/uL (ref 150–400)
RDW: 19.7 % — ABNORMAL HIGH (ref 11.5–15.5)

## 2013-08-30 LAB — CREATININE, SERUM: GFR calc non Af Amer: 46 mL/min — ABNORMAL LOW (ref >90)

## 2013-08-30 SURGERY — REPAIR, HERNIA, HIATAL, LAPAROSCOPIC
Anesthesia: General | Site: Abdomen | Wound class: Clean Contaminated

## 2013-08-30 MED ORDER — LACTATED RINGERS IV SOLN: INTRAVENOUS | Status: DC

## 2013-08-30 MED ORDER — EPHEDRINE SULFATE 50 MG/ML IJ SOLN
INTRAMUSCULAR | Status: DC | PRN
  Administered 2013-08-30: 5 mg via INTRAVENOUS
  Administered 2013-08-30: 10 mg via INTRAVENOUS

## 2013-08-30 MED ORDER — PHENYLEPHRINE HCL 10 MG/ML IJ SOLN
INTRAMUSCULAR | Status: DC | PRN
  Administered 2013-08-30 (×2): 80 ug via INTRAVENOUS

## 2013-08-30 MED ORDER — LACTATED RINGERS IV SOLN
INTRAVENOUS | Status: DC | PRN
  Administered 2013-08-30: 1000 mL via INTRAVENOUS

## 2013-08-30 MED ORDER — PROMETHAZINE HCL 25 MG/ML IJ SOLN
INTRAMUSCULAR | Status: AC
  Filled 2013-08-30: qty 1

## 2013-08-30 MED ORDER — LIDOCAINE HCL (CARDIAC) 20 MG/ML IV SOLN
INTRAVENOUS | Status: DC | PRN
  Administered 2013-08-30: 50 mg via INTRAVENOUS

## 2013-08-30 MED ORDER — HEPARIN SODIUM (PORCINE) 5000 UNIT/ML IJ SOLN
5000.0000 [IU] | Freq: Three times a day (TID) | INTRAMUSCULAR | Status: DC
  Administered 2013-08-30 – 2013-09-01 (×5): 5000 [IU] via SUBCUTANEOUS
  Filled 2013-08-30 (×8): qty 1

## 2013-08-30 MED ORDER — DEXTROSE 5 % IV SOLN
INTRAVENOUS | Status: AC
  Filled 2013-08-30 (×2): qty 1

## 2013-08-30 MED ORDER — 0.9 % SODIUM CHLORIDE (POUR BTL) OPTIME
TOPICAL | Status: DC | PRN
  Administered 2013-08-30: 1000 mL

## 2013-08-30 MED ORDER — GLYCOPYRROLATE 0.2 MG/ML IJ SOLN
INTRAMUSCULAR | Status: DC | PRN
  Administered 2013-08-30: 0.6 mg via INTRAVENOUS

## 2013-08-30 MED ORDER — ONDANSETRON HCL 4 MG PO TABS: 4.0000 mg | ORAL_TABLET | Freq: Four times a day (QID) | ORAL | Status: DC | PRN

## 2013-08-30 MED ORDER — KCL IN DEXTROSE-NACL 20-5-0.45 MEQ/L-%-% IV SOLN
INTRAVENOUS | Status: DC
  Administered 2013-08-30 – 2013-09-01 (×4): via INTRAVENOUS
  Filled 2013-08-30 (×7): qty 1000

## 2013-08-30 MED ORDER — MIDAZOLAM HCL 5 MG/5ML IJ SOLN
INTRAMUSCULAR | Status: DC | PRN
  Administered 2013-08-30: 2 mg via INTRAVENOUS

## 2013-08-30 MED ORDER — HYDROMORPHONE HCL PF 1 MG/ML IJ SOLN: 0.2500 mg | INTRAMUSCULAR | Status: DC | PRN

## 2013-08-30 MED ORDER — HEPARIN SODIUM (PORCINE) 5000 UNIT/ML IJ SOLN
5000.0000 [IU] | Freq: Once | INTRAMUSCULAR | Status: AC
  Administered 2013-08-30: 5000 [IU] via SUBCUTANEOUS
  Filled 2013-08-30: qty 1

## 2013-08-30 MED ORDER — ONDANSETRON HCL 4 MG/2ML IJ SOLN: 4.0000 mg | Freq: Four times a day (QID) | INTRAMUSCULAR | Status: DC | PRN

## 2013-08-30 MED ORDER — PROPOFOL 10 MG/ML IV BOLUS
INTRAVENOUS | Status: DC | PRN
  Administered 2013-08-30: 160 mg via INTRAVENOUS

## 2013-08-30 MED ORDER — HYDROMORPHONE HCL PF 1 MG/ML IJ SOLN
INTRAMUSCULAR | Status: DC | PRN
  Administered 2013-08-30 (×3): 0.5 mg via INTRAVENOUS

## 2013-08-30 MED ORDER — HYDROMORPHONE HCL PF 1 MG/ML IJ SOLN
0.5000 mg | INTRAMUSCULAR | Status: DC | PRN
  Administered 2013-08-30 – 2013-08-31 (×7): 0.5 mg via INTRAVENOUS
  Filled 2013-08-30 (×7): qty 1

## 2013-08-30 MED ORDER — BUPIVACAINE LIPOSOME 1.3 % IJ SUSP
20.0000 mL | Freq: Once | INTRAMUSCULAR | Status: AC
  Administered 2013-08-30: 20 mL
  Filled 2013-08-30: qty 20

## 2013-08-30 MED ORDER — PHENYLEPHRINE HCL 10 MG/ML IJ SOLN
10.0000 mg | INTRAVENOUS | Status: DC | PRN
  Administered 2013-08-30: 50 ug/min via INTRAVENOUS

## 2013-08-30 MED ORDER — DEXTROSE 5 % IV SOLN
2.0000 g | INTRAVENOUS | Status: AC
  Administered 2013-08-30: 2 g via INTRAVENOUS
  Filled 2013-08-30: qty 2

## 2013-08-30 MED ORDER — PROMETHAZINE HCL 25 MG/ML IJ SOLN
12.5000 mg | INTRAMUSCULAR | Status: DC | PRN
  Administered 2013-08-30: 6.25 mg via INTRAVENOUS

## 2013-08-30 MED ORDER — FENTANYL CITRATE 0.05 MG/ML IJ SOLN
INTRAMUSCULAR | Status: DC | PRN
  Administered 2013-08-30 (×2): 100 ug via INTRAVENOUS

## 2013-08-30 MED ORDER — ONDANSETRON HCL 4 MG/2ML IJ SOLN
INTRAMUSCULAR | Status: DC | PRN
  Administered 2013-08-30: 4 mg via INTRAMUSCULAR

## 2013-08-30 MED ORDER — SUCCINYLCHOLINE CHLORIDE 20 MG/ML IJ SOLN
INTRAMUSCULAR | Status: DC | PRN
  Administered 2013-08-30: 100 mg via INTRAVENOUS

## 2013-08-30 MED ORDER — NEOSTIGMINE METHYLSULFATE 1 MG/ML IJ SOLN
INTRAMUSCULAR | Status: DC | PRN
  Administered 2013-08-30: 5 mg via INTRAVENOUS

## 2013-08-30 MED ORDER — ROCURONIUM BROMIDE 100 MG/10ML IV SOLN
INTRAVENOUS | Status: DC | PRN
  Administered 2013-08-30: 50 mg via INTRAVENOUS
  Administered 2013-08-30 (×2): 20 mg via INTRAVENOUS

## 2013-08-30 MED ORDER — LACTATED RINGERS IV SOLN
INTRAVENOUS | Status: DC | PRN
  Administered 2013-08-30 (×2): via INTRAVENOUS

## 2013-08-30 MED ORDER — CEFAZOLIN SODIUM 1-5 GM-% IV SOLN
1.0000 g | Freq: Four times a day (QID) | INTRAVENOUS | Status: AC
  Administered 2013-08-30 – 2013-08-31 (×3): 1 g via INTRAVENOUS
  Filled 2013-08-30 (×4): qty 50

## 2013-08-30 SURGICAL SUPPLY — 59 items
ADH SKN CLS APL DERMABOND .7 (GAUZE/BANDAGES/DRESSINGS) ×1
APL SKNCLS STERI-STRIP NONHPOA (GAUZE/BANDAGES/DRESSINGS)
APPLIER CLIP ROT 10 11.4 M/L (STAPLE) ×2
APR CLP MED LRG 11.4X10 (STAPLE) ×1
BENZOIN TINCTURE PRP APPL 2/3 (GAUZE/BANDAGES/DRESSINGS) ×1 IMPLANT
CABLE HIGH FREQUENCY MONO STRZ (ELECTRODE) ×1 IMPLANT
CANISTER SUCTION 2500CC (MISCELLANEOUS) ×1 IMPLANT
CLAMP ENDO BABCK 10MM (STAPLE) IMPLANT
CLIP APPLIE ROT 10 11.4 M/L (STAPLE) IMPLANT
CLOTH BEACON ORANGE TIMEOUT ST (SAFETY) ×1 IMPLANT
COVER SURGICAL LIGHT HANDLE (MISCELLANEOUS) ×1 IMPLANT
DECANTER SPIKE VIAL GLASS SM (MISCELLANEOUS) ×1 IMPLANT
DERMABOND ADVANCED (GAUZE/BANDAGES/DRESSINGS) ×1
DERMABOND ADVANCED .7 DNX12 (GAUZE/BANDAGES/DRESSINGS) IMPLANT
DEVICE SUT QUICK LOAD TK 5 (STAPLE) ×7 IMPLANT
DEVICE SUT TI-KNOT TK 5X26 (MISCELLANEOUS) ×1 IMPLANT
DEVICE SUTURE ENDOST 10MM (ENDOMECHANICALS) ×2 IMPLANT
DISSECTOR BLUNT TIP ENDO 5MM (MISCELLANEOUS) ×2 IMPLANT
DRAIN PENROSE 18X1/2 LTX STRL (DRAIN) ×2 IMPLANT
DRAPE LAPAROSCOPIC ABDOMINAL (DRAPES) ×2 IMPLANT
ELECT REM PT RETURN 9FT ADLT (ELECTROSURGICAL) ×2
ELECTRODE REM PT RTRN 9FT ADLT (ELECTROSURGICAL) ×1 IMPLANT
FELT TEFLON 4 X1 (Mesh General) ×2 IMPLANT
FILTER SMOKE EVAC LAPAROSHD (FILTER) IMPLANT
GLOVE BIOGEL M 8.0 STRL (GLOVE) ×2 IMPLANT
GLOVE BIOGEL PI IND STRL 7.0 (GLOVE) IMPLANT
GLOVE BIOGEL PI INDICATOR 7.0 (GLOVE) ×2
GOWN PREVENTION PLUS LG XLONG (DISPOSABLE) ×1 IMPLANT
GOWN STRL REIN XL XLG (GOWN DISPOSABLE) ×6 IMPLANT
GRASPER ENDO BABCOCK 10 (MISCELLANEOUS) IMPLANT
GRASPER ENDO BABCOCK 10MM (MISCELLANEOUS)
KIT BASIN OR (CUSTOM PROCEDURE TRAY) ×2 IMPLANT
NS IRRIG 1000ML POUR BTL (IV SOLUTION) ×2 IMPLANT
PENCIL BUTTON HOLSTER BLD 10FT (ELECTRODE) IMPLANT
SCALPEL HARMONIC ACE (MISCELLANEOUS) ×2 IMPLANT
SCISSORS LAP 5X35 DISP (ENDOMECHANICALS) ×2 IMPLANT
SET IRRIG TUBING LAPAROSCOPIC (IRRIGATION / IRRIGATOR) ×2 IMPLANT
SLEEVE ADV FIXATION 5X100MM (TROCAR) IMPLANT
SLEEVE ENDOPATH XCEL 5M (ENDOMECHANICALS) ×2 IMPLANT
SLEEVE Z-THREAD 5X100MM (TROCAR) IMPLANT
SOLUTION ANTI FOG 6CC (MISCELLANEOUS) ×2 IMPLANT
STAPLER VISISTAT 35W (STAPLE) ×2 IMPLANT
STRIP CLOSURE SKIN 1/2X4 (GAUZE/BANDAGES/DRESSINGS) IMPLANT
SUT SURGIDAC NAB ES-9 0 48 120 (SUTURE) ×13 IMPLANT
SUT VIC AB 4-0 SH 18 (SUTURE) ×2 IMPLANT
SYR 30ML LL (SYRINGE) ×2 IMPLANT
TIP INNERVISION DETACH 40FR (MISCELLANEOUS) IMPLANT
TIP INNERVISION DETACH 50FR (MISCELLANEOUS) IMPLANT
TIP INNERVISION DETACH 56FR (MISCELLANEOUS) IMPLANT
TIPS INNERVISION DETACH 40FR (MISCELLANEOUS)
TRAY FOLEY CATH 14FRSI W/METER (CATHETERS) ×2 IMPLANT
TRAY LAP CHOLE (CUSTOM PROCEDURE TRAY) ×2 IMPLANT
TROCAR ADV FIXATION 11X100MM (TROCAR) IMPLANT
TROCAR ADV FIXATION 5X100MM (TROCAR) IMPLANT
TROCAR BLADELESS OPT 5 100 (ENDOMECHANICALS) ×3 IMPLANT
TROCAR XCEL BLUNT TIP 100MML (ENDOMECHANICALS) IMPLANT
TROCAR XCEL NON-BLD 11X100MML (ENDOMECHANICALS) ×2 IMPLANT
TROCAR XCEL UNIV SLVE 11M 100M (ENDOMECHANICALS) IMPLANT
TUBING FILTER THERMOFLATOR (ELECTROSURGICAL) ×2 IMPLANT

## 2013-08-30 NOTE — Anesthesia Procedure Notes (Signed)
Procedure Name: Intubation Date/Time: 08/30/2013 7:31 AM Performed by: [place], Nyasiah Moffet C Pre-anesthesia Checklist: Patient identified, Suction available, Emergency Drugs available, Patient being monitored and Timeout performed Patient Re-evaluated:Patient Re-evaluated prior to inductionOxygen Delivery Method: Circle system utilized Preoxygenation: Pre-oxygenation with 100% oxygen Intubation Type: IV induction Ventilation: Mask ventilation without difficulty Grade View: Grade II Number of attempts: 2 Airway Equipment and Method: Video-laryngoscopy Placement Confirmation: ETT inserted through vocal cords under direct vision,  breath sounds checked- equal and bilateral,  positive ETCO2 and CO2 detector Secured at: 20 cm Tube secured with: Tape Dental Injury: Teeth and Oropharynx as per pre-operative assessment  Difficulty Due To: Difficulty was anticipated and Difficult Airway- due to limited oral opening Comments: Pt with limited mouth opening upon airway exam.  Still minimal opening after induction.  DL x 1 with Mac 3, epiglottis only seen.  DL with Glidescope. Grade 2 view.  7.5 ETT placed. VSS

## 2013-08-30 NOTE — Interval H&P Note (Signed)
History and Physical Interval Note:  08/30/2013 7:16 AM  Valerie Davis  has presented today for surgery, with the diagnosis of large type large hiatus hernia   The various methods of treatment have been discussed with the patient and family. After consideration of risks, benefits and other options for treatment, the patient has consented to  Procedure(s): LAPAROSCOPIC REPAIR OF HIATAL HERNIA (N/A) as a surgical intervention .  The patient's history has been reviewed, patient examined, no change in status, stable for surgery.  I have reviewed the patient's chart and labs.  Questions were answered to the patient's satisfaction.     Laneisha Mino B

## 2013-08-30 NOTE — Transfer of Care (Signed)
Immediate Anesthesia Transfer of Care Note  Patient: Valerie Davis  Procedure(s) Performed: Procedure(s): LAPAROSCOPIC REPAIR OF HIATAL HERNIA (N/A)  Patient Location: PACU  Anesthesia Type:General  Level of Consciousness: awake and alert   Airway & Oxygen Therapy: Patient Spontanous Breathing and Patient connected to face mask oxygen  Post-op Assessment: Report given to PACU RN and Post -op Vital signs reviewed and stable  Post vital signs: Reviewed and stable  Complications: No apparent anesthesia complications

## 2013-08-30 NOTE — Anesthesia Postprocedure Evaluation (Signed)
  Anesthesia Post-op Note  Patient: Valerie Davis  Procedure(s) Performed: Procedure(s) (LRB): LAPAROSCOPIC REPAIR OF HIATAL HERNIA (N/A)  Patient Location: PACU  Anesthesia Type: General  Level of Consciousness: awake and alert   Airway and Oxygen Therapy: Patient Spontanous Breathing  Post-op Pain: mild  Post-op Assessment: Post-op Vital signs reviewed, Patient's Cardiovascular Status Stable, Respiratory Function Stable, Patent Airway and No signs of Nausea or vomiting  Last Vitals:  Filed Vitals:   08/30/13 1313  BP: 135/76  Pulse: 89  Temp: 36.6 C  Resp: 16    Post-op Vital Signs: stable   Complications: No apparent anesthesia complications

## 2013-08-30 NOTE — Op Note (Addendum)
Surgeon: Wenda Low, MD, FACS  Asst:  Ovidio Kin, MD, FACS  Anes:  general  Procedure: Laparoscopic takedown of large type III hiatus hernia and 4 suture nonpledgeted repair and NIssen fundoplication over a #56 dilator  Diagnosis: Type III hiatus hernia  Complications: none  EBL:   22 cc  Description of Procedure:  The patient was taken to room 1 and given general anesthesia.  The abdomen was prepped with PCMX and draped in a timeout performed. Access to the M. Was achieved to the left upper quadrant with a 5 mm 0 Optiview technique without difficulty. Following insufflation 5 ports were placed all 5 mm and eventually 1 on the right side was upgraded a 12. Upper midline Nathanson retractor was placed to retract the left lateral segment. Medially visible was a large defect in her diaphragm with about half of her stomach up in the chest.  Approach this from the patient's right side incising the gastrocolic window and in taking this up to the right crus and then divide the sac and began pulling the sac out of the chest. From a work my way across the and diaphragm anteriorly toward the midline. At that point I went over on the greater curvature beginning takedown short gastrics work my way up again into the chest pulling down the stomach and bringing the stomach from the sac. Eventually this was freed in its entirety all way around and I went posteriorly and divided the sac. Fatty hernias were brought down on the stomach and the esophageal length was reached. Penrose drain was used to hold the esophagus to length. I excised some of the sac using harmonic scalpel and removed it piecemeal to free up the upper portion the stomach. We had no reason to believe that we created any enterotomies. Everything looked good the tissue appeared very healthy. I approximated the hiatus posteriorly with 4 sutures using a 0 [place] and the [employer].  Following this and Ms [place] past a 56 lighted bougie easily into  the stomach. This was snug with the diaphragmatic closure a looked good. A 3 suture wrap of the fundus was used to wrap the distal esophagus with sutures on both areas of stomach and bites of the superficial esophagus.  A good healthy wrap was present. Having looked to be in order. I then removed the [facility] retractor. Injected all the ports with Exparel closed with 4-0 Vicryl and Dermabond. Patient tolerated the procedure well taken recovery room in satisfactory condition.  Matt B. Daphine Deutscher, MD, [facility], [place] [phone]

## 2013-08-31 ENCOUNTER — Inpatient Hospital Stay (HOSPITAL_COMMUNITY): Payer: 59

## 2013-08-31 ENCOUNTER — Encounter (HOSPITAL_COMMUNITY): Payer: Self-pay | Admitting: Surgery

## 2013-08-31 LAB — BASIC METABOLIC PANEL
BUN: 14 mg/dL (ref 6–23)
Calcium: 8.5 mg/dL (ref 8.4–10.5)
GFR calc non Af Amer: 48 mL/min — ABNORMAL LOW (ref >90)
Glucose, Bld: 125 mg/dL — ABNORMAL HIGH (ref 70–99)
Sodium: 138 mEq/L (ref 135–145)

## 2013-08-31 LAB — CBC
HCT: 29.8 % — ABNORMAL LOW (ref 36.0–46.0)
Hemoglobin: 9.2 g/dL — ABNORMAL LOW (ref 12.0–15.0)
MCH: 25.9 pg — ABNORMAL LOW (ref 26.0–34.0)
MCHC: 30.9 g/dL (ref 30.0–36.0)
MCV: 83.9 fL (ref 78.0–100.0)
RDW: 20 % — ABNORMAL HIGH (ref 11.5–15.5)

## 2013-08-31 MED ORDER — ACETAMINOPHEN 160 MG/5ML PO SOLN
325.0000 mg | Freq: Four times a day (QID) | ORAL | Status: DC | PRN
  Administered 2013-08-31 (×2): 325 mg via ORAL
  Filled 2013-08-31 (×2): qty 20.3

## 2013-08-31 MED ORDER — OXYCODONE HCL 5 MG/5ML PO SOLN
5.0000 mg | ORAL | Status: DC | PRN
  Administered 2013-08-31: 10 mg via ORAL
  Filled 2013-08-31: qty 10

## 2013-08-31 NOTE — Care Management Note (Signed)
    Page 1 of 1   08/31/2013     11:10:03 AM   CARE MANAGEMENT NOTE 08/31/2013  Patient:  Valerie Davis, Valerie Davis   Account Number:  [id number]  Date Initiated:  08/31/2013  Documentation initiated by:  Lorenda Ishihara  Subjective/Objective Assessment:   72 yo female admitted s/p lap HH repair, Nissen Fundoplication. PTA lived at home with spouse.     Action/Plan:   Home when stable   Anticipated DC Date:  09/03/2013   Anticipated DC Plan:  HOME/SELF CARE      DC Planning Services  CM consult      Choice offered to / List presented to:             Status of service:  Completed, signed off Medicare Important Message given?   (If response is "NO", the following Medicare IM given date fields will be blank) Date Medicare IM given:   Date Additional Medicare IM given:    Discharge Disposition:  HOME/SELF CARE  Per UR Regulation:  Reviewed for med. necessity/level of care/duration of stay  If discussed at Long Length of Stay Meetings, dates discussed:    Comments:

## 2013-08-31 NOTE — Progress Notes (Signed)
Patient ID: Valerie Davis, female   DOB: 02-07-1941, 72 y.o.   MRN: [id number] [facility] Progress Note:   1 Day Post-Op  Subjective: Mental status is having a head ache but otherwise ok Objective: Vital signs in last 24 hours: Temp:  [97.5 F (36.4 C)-99.7 F (37.6 C)] 99.3 F (37.4 C) (10/15 1000) Pulse Rate:  [86-93] 89 (10/15 1000) Resp:  [14-18] 18 (10/15 1000) BP: (108-144)/(53-79) 130/69 mmHg (10/15 1000) SpO2:  [90 %-98 %] 90 % (10/15 1000) Weight:  [159 lb 8 oz (72.35 kg)] 159 lb 8 oz (72.35 kg) (10/14 1215)  Intake/Output from previous day: 10/14 0701 - 10/15 0700 In: 3368.3 [I.V.:3368.3] Out: 1940 [Urine:1940] Intake/Output this shift: Total I/O In: -  Out: 175 [Urine:175]  Physical Exam: Work of breathing is normal.    Lab Results:  Results for orders placed during the hospital encounter of 08/30/13 (from the past 48 hour(s))  CBC     Status: Abnormal   Collection Time    08/30/13  1:15 PM      Result Value Range   WBC 11.5 (*) 4.0 - 10.5 K/uL   RBC 3.58 (*) 3.87 - 5.11 MIL/uL   Hemoglobin 9.4 (*) 12.0 - 15.0 g/dL   HCT 40.9 (*) 81.1 - 91.4 %   MCV 83.5  78.0 - 100.0 fL   MCH 26.3  26.0 - 34.0 pg   MCHC 31.4  30.0 - 36.0 g/dL   RDW 78.2 (*) 95.6 - 21.3 %   Platelets 228  150 - 400 K/uL  CREATININE, SERUM     Status: Abnormal   Collection Time    08/30/13  1:15 PM      Result Value Range   Creatinine, Ser 1.18 (*) 0.50 - 1.10 mg/dL   GFR calc non Af Amer 46 (*) >90 mL/min   GFR calc Af Amer 53 (*) >90 mL/min   Comment: (NOTE)     The eGFR has been calculated using the CKD EPI equation.     This calculation has not been validated in all clinical situations.     eGFR's persistently <90 mL/min signify possible Chronic Kidney     Disease.  CBC     Status: Abnormal   Collection Time    08/31/13  4:58 AM      Result Value Range   WBC 6.4  4.0 - 10.5 K/uL   RBC 3.55 (*) 3.87 - 5.11 MIL/uL   Hemoglobin 9.2 (*) 12.0 - 15.0 g/dL   HCT 08.6 (*)  57.8 - 46.0 %   MCV 83.9  78.0 - 100.0 fL   MCH 25.9 (*) 26.0 - 34.0 pg   MCHC 30.9  30.0 - 36.0 g/dL   RDW 46.9 (*) 62.9 - 52.8 %   Platelets 224  150 - 400 K/uL  BASIC METABOLIC PANEL     Status: Abnormal   Collection Time    08/31/13  4:58 AM      Result Value Range   Sodium 138  135 - 145 mEq/L   Potassium 3.9  3.5 - 5.1 mEq/L   Chloride 100  96 - 112 mEq/L   CO2 31  19 - 32 mEq/L   Glucose, Bld 125 (*) 70 - 99 mg/dL   BUN 14  6 - 23 mg/dL   Creatinine, Ser 4.13 (*) 0.50 - 1.10 mg/dL   Calcium 8.5  8.4 - 24.4 mg/dL   GFR calc non Af Amer 48 (*) >90 mL/min  GFR calc Af Amer 56 (*) >90 mL/min   Comment: (NOTE)     The eGFR has been calculated using the CKD EPI equation.     This calculation has not been validated in all clinical situations.     eGFR's persistently <90 mL/min signify possible Chronic Kidney     Disease.    Radiology/Results: No results found.  Anti-infectives: Anti-infectives   Start     Dose/Rate Route Frequency Ordered Stop   08/30/13 1400  ceFAZolin (ANCEF) IVPB 1 g/50 mL premix     1 g 100 mL/hr over 30 Minutes Intravenous Every 6 hours 08/30/13 1220 08/31/13 0420   08/30/13 0607  cefOXitin (MEFOXIN) 2 g in dextrose 5 % 50 mL IVPB     2 g 100 mL/hr over 30 Minutes Intravenous On call to O.R. 08/30/13 0607 08/30/13 0738      Assessment/Plan: Problem List: Patient Active Problem List   Diagnosis Date Noted  . Chronic upper GI bleeding 08/19/2013  . Cameron ulcer 08/19/2013  . Orthostasis 08/19/2013  . Near syncope 08/19/2013  . Large type III mixed hiatus hernia 07/27/2013  . Personal history of colonic adenoma 07/06/2013  . INSOMNIA-SLEEP DISORDER-UNSPEC 09/05/2010  . NEURALGIA, TRIGEMINAL 06/06/2010  . HYPOTHYROIDISM 04/26/2010  . ANEMIA-IRON DEFICIENCY 04/26/2010  . HYPERTENSION 04/26/2010  . ANEMIA, B12 DEFICIENCY 12/17/2009    Headache.  UGI looks good.  Start clears.  1 Day Post-Op    LOS: 1 day   Matt B. Daphine Deutscher, MD,  [facility] [phone] beeper [phone]  08/31/2013 12:10 PM

## 2013-09-01 DIAGNOSIS — Z9889 Other specified postprocedural states: Secondary | ICD-10-CM

## 2013-09-01 LAB — CBC
MCH: 25.7 pg — ABNORMAL LOW (ref 26.0–34.0)
MCHC: 30.5 g/dL (ref 30.0–36.0)
MCV: 84.3 fL (ref 78.0–100.0)
Platelets: 216 10*3/uL (ref 150–400)
RBC: 3.5 MIL/uL — ABNORMAL LOW (ref 3.87–5.11)
RDW: 20.1 % — ABNORMAL HIGH (ref 11.5–15.5)
WBC: 5.1 10*3/uL (ref 4.0–10.5)

## 2013-09-01 LAB — BASIC METABOLIC PANEL
CO2: 29 mEq/L (ref 19–32)
Calcium: 8.8 mg/dL (ref 8.4–10.5)
Chloride: 102 mEq/L (ref 96–112)
Creatinine, Ser: 1.13 mg/dL — ABNORMAL HIGH (ref 0.50–1.10)
GFR calc non Af Amer: 48 mL/min — ABNORMAL LOW (ref >90)
Sodium: 137 mEq/L (ref 135–145)

## 2013-09-01 MED ORDER — ONDANSETRON HCL 4 MG PO TABS: 4.0000 mg | ORAL_TABLET | Freq: Four times a day (QID) | ORAL | Status: DC | PRN

## 2013-09-01 MED ORDER — OXYCODONE HCL 5 MG/5ML PO SOLN: 5.0000 mg | ORAL | Status: DC | PRN

## 2013-09-01 NOTE — Discharge Summary (Signed)
Physician Discharge Summary  Patient ID: Valerie Davis MRN: [id number] DOB/AGE: [birth year] 72 y.o.  Admit date: 08/30/2013 Discharge date: 09/01/2013  Admission Diagnoses:  Large type III hiatus hernia  Discharge Diagnoses:  same  Active Problems:   Repair large type III mixed hiatus hernia with Nissen fundoplication over a 56 bougie October 2014   Surgery:  Repair hiatus hernia and Nissen fundoplication  Discharged Condition: improved  Hospital Course:   Had surgery.  UGI on PD 1 looked good.  Begun on liquids which she tolerated.  Ready for discharge on PD 2.    Consults: none  Significant Diagnostic Studies: UGI-repair intact and no leak    Discharge Exam: Blood pressure 131/83, pulse 87, temperature 98.9 F (37.2 C), temperature source Oral, resp. rate 18, height 5\' 3"  (1.6 m), weight 159 lb 8 oz (72.35 kg), SpO2 93.00%. Incisions OK.  Sore but improving.    Disposition: 01-Home or Self Care  Discharge Orders   Future Orders Complete By Expires   Diet - low sodium heart healthy  As directed    Discharge instructions  As directed    Comments:     Full liquid diet for a week-then pureed diet for 3 weeks   Increase activity slowly  As directed    No wound care  As directed        Medication List    STOP taking these medications       omeprazole 20 MG capsule  Commonly known as:  PRILOSEC      TAKE these medications       AMBIEN 10 MG tablet  Generic drug:  zolpidem  Take 1 tablet (10 mg total) by mouth at bedtime as needed for sleep.     amphetamine-dextroamphetamine 20 MG tablet  Commonly known as:  ADDERALL  Take 20 mg by mouth 3 (three) times daily.     cetirizine 10 MG tablet  Commonly known as:  ZYRTEC  Take 10 mg by mouth daily.     cyanocobalamin 1000 MCG/ML injection  Commonly known as:  (VITAMIN B-12)  Inject 1,000 mcg into the muscle every 30 (thirty) days.     DULoxetine 60 MG capsule  Commonly known as:  CYMBALTA  Take 60 mg by  mouth every evening. One tablet by mouth once daily     ferrous sulfate 325 (65 FE) MG tablet  Take 1 tablet (325 mg total) by mouth daily.     levothyroxine 88 MCG tablet  Commonly known as:  SYNTHROID, LEVOTHROID  Take 88 mcg by mouth daily before breakfast.     losartan-hydrochlorothiazide 100-12.5 MG per tablet  Commonly known as:  HYZAAR  Take 1 tablet by mouth every morning.     ondansetron 4 MG tablet  Commonly known as:  ZOFRAN  Take 1 tablet (4 mg total) by mouth every 6 (six) hours as needed for nausea.     oxyCODONE 5 MG/5ML solution  Commonly known as:  ROXICODONE  Take 5-10 mLs (5-10 mg total) by mouth every 4 (four) hours as needed.     Vitamin D 2000 UNITS Caps  Take 1 capsule by mouth daily.           Follow-up Information   Follow up with Luretha Murphy B, MD In 3 weeks.   Specialty:  General Surgery   Contact information:   [address] [place] [address] [phone]       Signed: Valarie Merino 09/01/2013, 8:29 AM

## 2013-09-18 DIAGNOSIS — E538 Deficiency of other specified B group vitamins: Secondary | ICD-10-CM | POA: Diagnosis not present

## 2013-09-18 DIAGNOSIS — I10 Essential (primary) hypertension: Secondary | ICD-10-CM | POA: Diagnosis not present

## 2013-09-18 DIAGNOSIS — E039 Hypothyroidism, unspecified: Secondary | ICD-10-CM | POA: Diagnosis not present

## 2013-09-18 DIAGNOSIS — R5381 Other malaise: Secondary | ICD-10-CM | POA: Diagnosis not present

## 2013-09-18 DIAGNOSIS — K449 Diaphragmatic hernia without obstruction or gangrene: Secondary | ICD-10-CM | POA: Diagnosis not present

## 2013-09-18 DIAGNOSIS — D509 Iron deficiency anemia, unspecified: Secondary | ICD-10-CM | POA: Diagnosis not present

## 2013-09-23 ENCOUNTER — Encounter (INDEPENDENT_AMBULATORY_CARE_PROVIDER_SITE_OTHER): Payer: Self-pay

## 2013-09-23 ENCOUNTER — Ambulatory Visit (INDEPENDENT_AMBULATORY_CARE_PROVIDER_SITE_OTHER): Payer: 59 | Admitting: Surgery

## 2013-09-23 ENCOUNTER — Encounter (INDEPENDENT_AMBULATORY_CARE_PROVIDER_SITE_OTHER): Payer: Self-pay | Admitting: Surgery

## 2013-09-23 VITALS — BP 118/78 | HR 100 | Temp 98.4°F | Resp 15 | Ht 63.5 in | Wt 156.2 lb

## 2013-09-23 DIAGNOSIS — Z09 Encounter for follow-up examination after completed treatment for conditions other than malignant neoplasm: Secondary | ICD-10-CM

## 2013-09-23 DIAGNOSIS — Z9889 Other specified postprocedural states: Secondary | ICD-10-CM

## 2013-09-23 NOTE — Progress Notes (Signed)
Valerie Davis 72 y.o.  Body mass index is 27.23 kg/(m^2).  Patient Active Problem List   Diagnosis Date Noted  . Repair large type III mixed hiatus hernia with Nissen fundoplication over a 56 bougie October 2014 09/01/2013    Priority: High  . Chronic upper GI bleeding 08/19/2013  . Cameron ulcer 08/19/2013  . Orthostasis 08/19/2013  . Near syncope 08/19/2013  . Personal history of colonic adenoma 07/06/2013  . INSOMNIA-SLEEP DISORDER-UNSPEC 09/05/2010  . NEURALGIA, TRIGEMINAL 06/06/2010  . HYPOTHYROIDISM 04/26/2010  . ANEMIA-IRON DEFICIENCY 04/26/2010  . HYPERTENSION 04/26/2010  . ANEMIA, B12 DEFICIENCY 12/17/2009    Allergies  Allergen Reactions  . Ace Inhibitors     REACTION: cough  . Amlodipine Besylate     REACTION: malaise   . Bupropion Hcl     REACTION: urticaria, angioedema caused by wellbutrin  . Levofloxacin     REACTION: abdominal pain  . Pantoprazole Sodium     REACTION: rash    Past Surgical History  Procedure Laterality Date  . Vaginal hysterectomy    . Bilateral salpingoophorectomy    . Knee menscectomy Right 07/2009  . Colonoscopy    . Upper gastrointestinal endoscopy    . Abdominal hysterectomy  age 56  . Hiatal hernia repair N/A 08/30/2013    Procedure: LAPAROSCOPIC REPAIR OF HIATAL HERNIA;  Surgeon: Valarie Merino, MD;  Location: [facility];  Service: General;  Laterality: N/A;  With MESH   Valerie Baars, MD No diagnosis found.  Doing well after hiatus hernia repair and Nissen fundoplication.  She is 24 days postop today.  She has lost about 10 lbs and looks great.  She is having stamina issues and I think will be able to return to work on Friday, Dec 5.  I discussed gradual advancement of diet and I will see her again in 6 weeks.  Return 6 weeks Valerie B. Daphine Deutscher, MD, [facility] [phone] beeper [phone]  09/23/2013 2:28 PM

## 2013-09-23 NOTE — Patient Instructions (Signed)
Thanks for your patience.  If you need further assistance after leaving the office, please call our office and speak with a [facility] nurse.  [phone].  If you want to leave a message for Dr. Anneka Studer, please call his office phone at [phone]. 

## 2013-09-28 ENCOUNTER — Telehealth (INDEPENDENT_AMBULATORY_CARE_PROVIDER_SITE_OTHER): Payer: Self-pay | Admitting: General Surgery

## 2013-09-28 ENCOUNTER — Encounter (INDEPENDENT_AMBULATORY_CARE_PROVIDER_SITE_OTHER): Payer: Self-pay | Admitting: General Surgery

## 2013-09-28 NOTE — Telephone Encounter (Signed)
Patient calling to get a new work note. She was originally going back to work on 10/11/2013 and wanted it to get extended until 10/21/2013 because she did not think she could work on black Friday. She got a call from her work and another employee has quit and they have asked her to come back to work early. She would now like a note stating she can start work on 10/10/2013 for 3 days a week until 10/21/2013 when she will start full time. Note written and mailed to patient per her request.

## 2013-11-03 DIAGNOSIS — H2510 Age-related nuclear cataract, unspecified eye: Secondary | ICD-10-CM | POA: Diagnosis not present

## 2013-11-03 DIAGNOSIS — H31009 Unspecified chorioretinal scars, unspecified eye: Secondary | ICD-10-CM | POA: Diagnosis not present

## 2013-11-03 DIAGNOSIS — H5210 Myopia, unspecified eye: Secondary | ICD-10-CM | POA: Diagnosis not present

## 2013-11-09 ENCOUNTER — Encounter (INDEPENDENT_AMBULATORY_CARE_PROVIDER_SITE_OTHER): Payer: 59 | Admitting: Surgery

## 2013-11-14 ENCOUNTER — Encounter (INDEPENDENT_AMBULATORY_CARE_PROVIDER_SITE_OTHER): Payer: Self-pay | Admitting: Surgery

## 2013-11-15 ENCOUNTER — Encounter (INDEPENDENT_AMBULATORY_CARE_PROVIDER_SITE_OTHER): Payer: Self-pay | Admitting: Surgery

## 2013-12-08 DIAGNOSIS — M1710 Unilateral primary osteoarthritis, unspecified knee: Secondary | ICD-10-CM | POA: Diagnosis not present

## 2013-12-21 ENCOUNTER — Encounter (INDEPENDENT_AMBULATORY_CARE_PROVIDER_SITE_OTHER): Payer: 59 | Admitting: Surgery

## 2013-12-27 IMAGING — RF DG UGI W/ HIGH DENSITY W/KUB
14 of 21 series · 15 of 24 positions shown · non-contrast
Comparison: None.

CLINICAL DATA: Hiatal hernia.  Anemia

UPPER GI SERIES W/HIGH DENSITY W/KUB
TECHNIQUE: After obtaining a scout radiograph, upper GI series
performed with high density barium and effervescent agent. Thin
barium also used.
Fluoroscopy Time: 2-minute-00-second

[Series 1: run · 2 of 23 slices shown (1 of 13)]
[im 1/23]
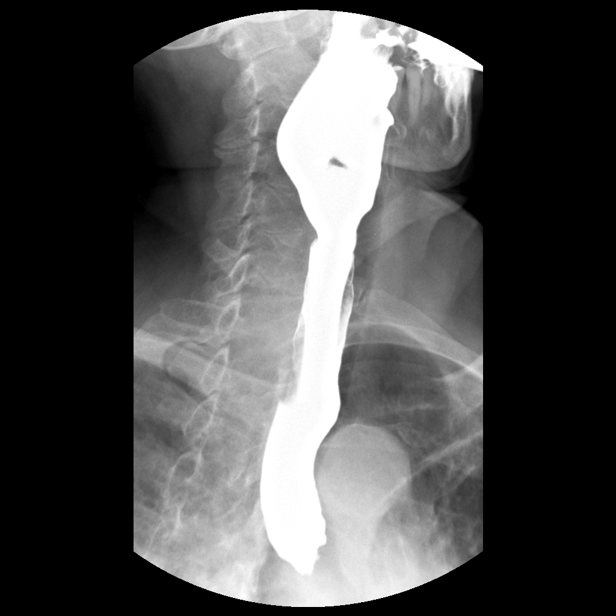
[im 15/23]
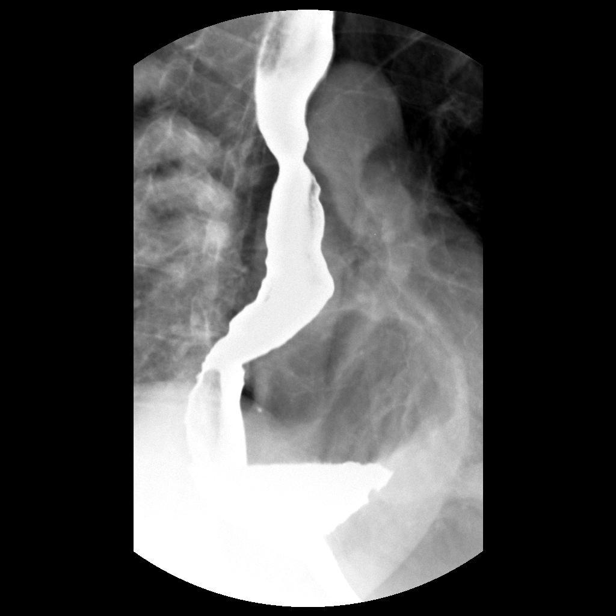

[Series 2: run · 1 of 1 slices shown (2 of 13)]
[im 1/1]
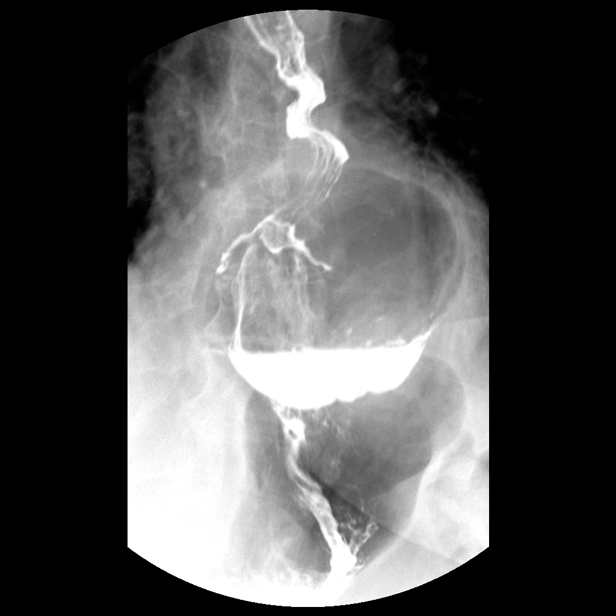

[Series 3: run · 1 of 1 slices shown (3 of 13)]
[im 1/1]
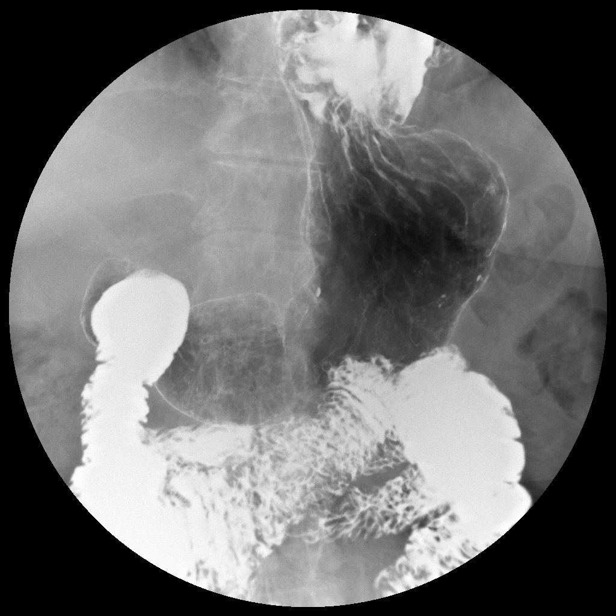

[Series 5: run · 1 of 1 slices shown (4 of 13)]
[im 1/1]
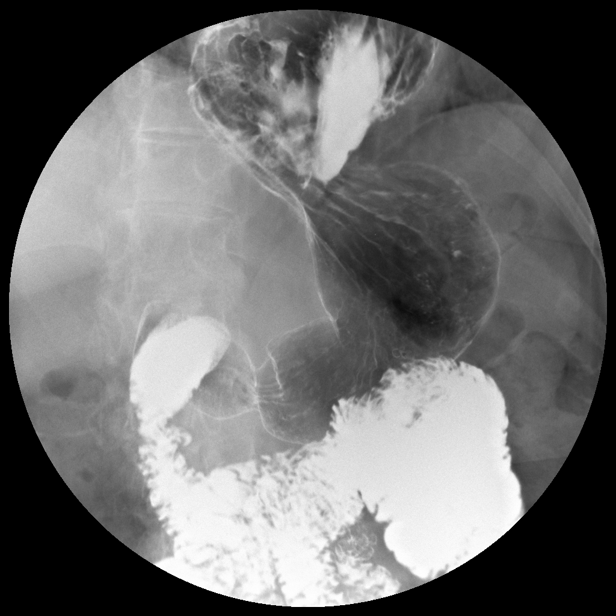

[Series 6: run · 1 of 1 slices shown (5 of 13)]
[im 1/1]
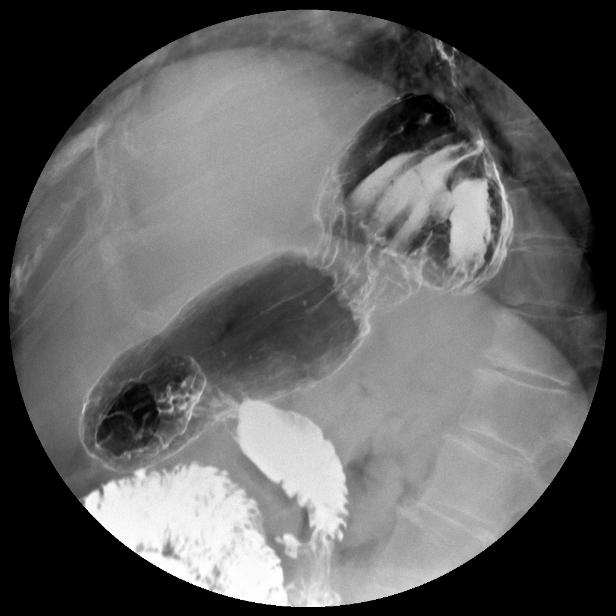

[Series 8: run · 1 of 4 slices shown (6 of 13)]
[im 1/4]
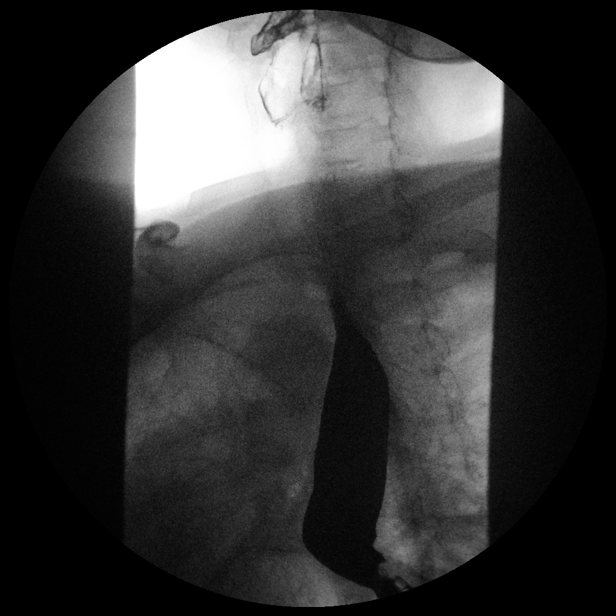

[Series 10: run · 1 of 1 slices shown (7 of 13)]
[im 1/1]
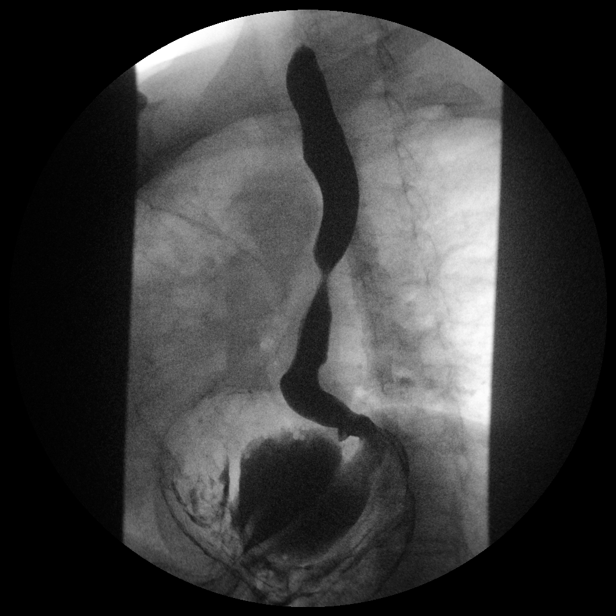

[Series 11: run · 1 of 1 slices shown (8 of 13)]
[im 1/1]
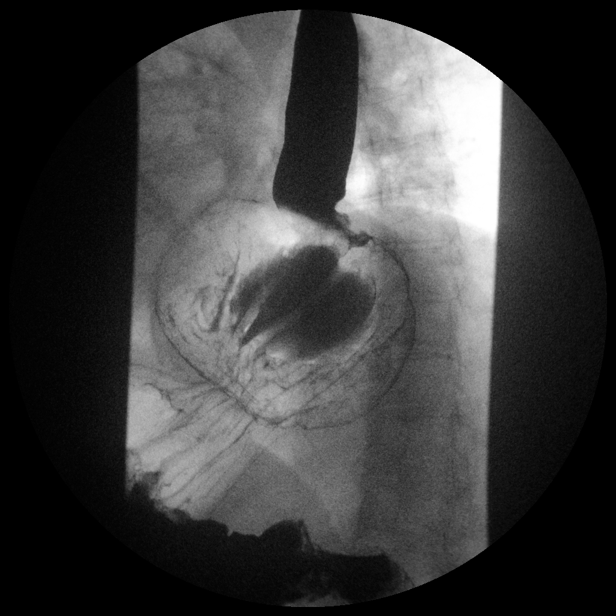

[Series 13: run · 1 of 1 slices shown (9 of 13)]
[im 1/1]
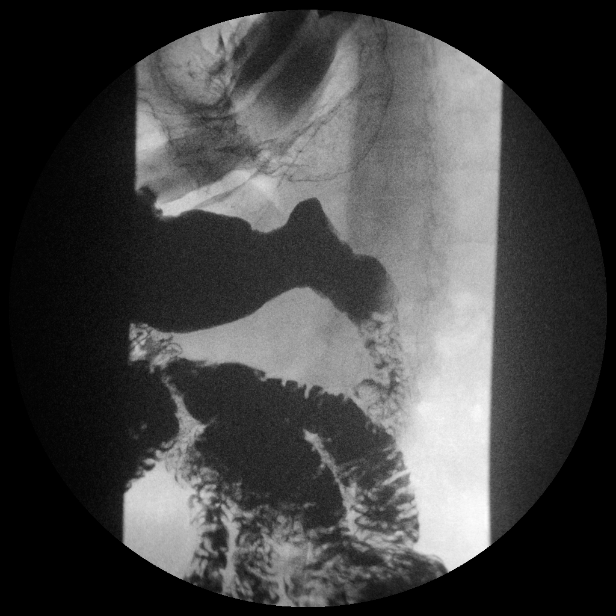

[Series 14: run · 1 of 1 slices shown (10 of 13)]
[im 1/1]
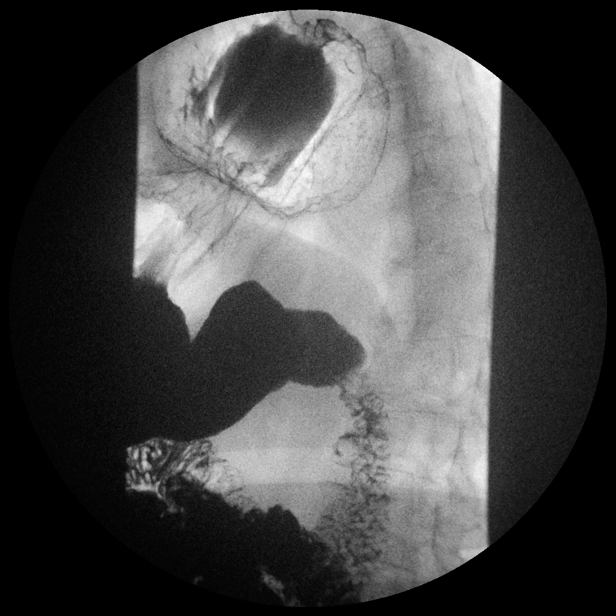

[Series 16: run · 1 of 1 slices shown (11 of 13)]
[im 1/1]
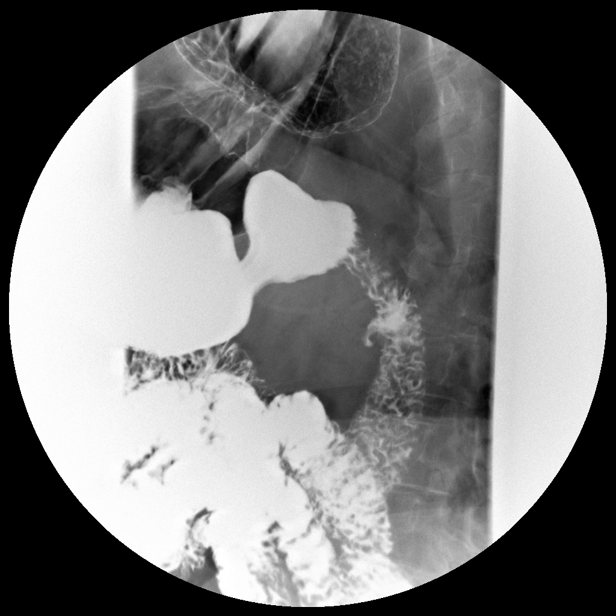

[Series 18: run · 1 of 1 slices shown (12 of 13)]
[im 1/1]
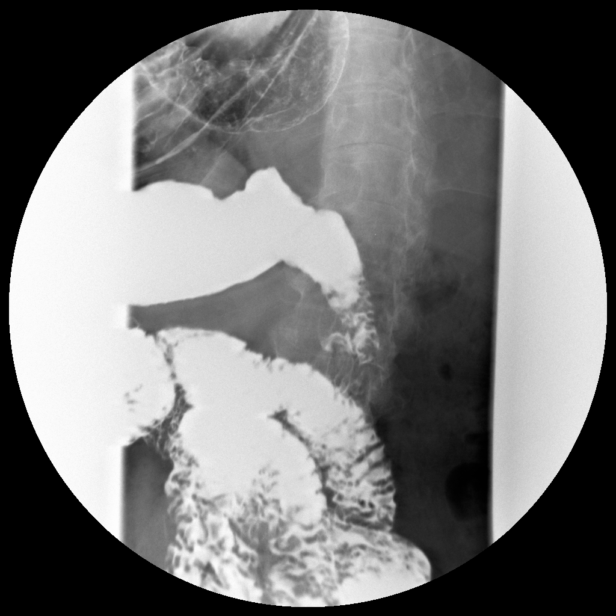

[Series 19: run · 1 of 1 slices shown (13 of 13)]
[im 1/1]
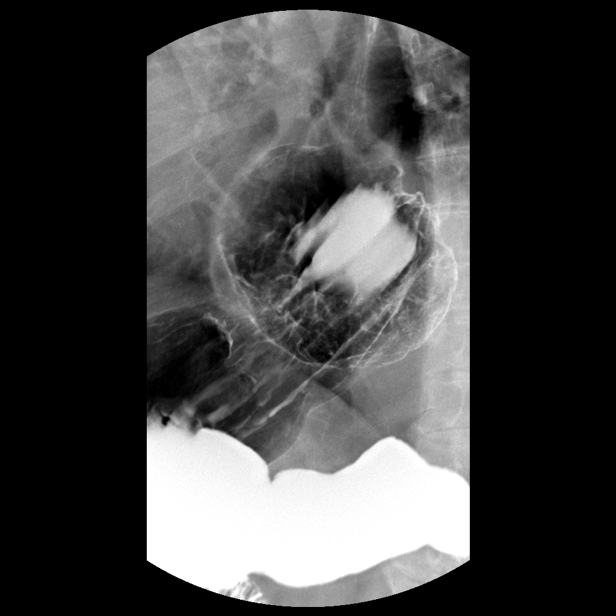

[Series 1001: view not recorded · 0.20mm/px · 1 of 1 slices shown]
[im 1/1]
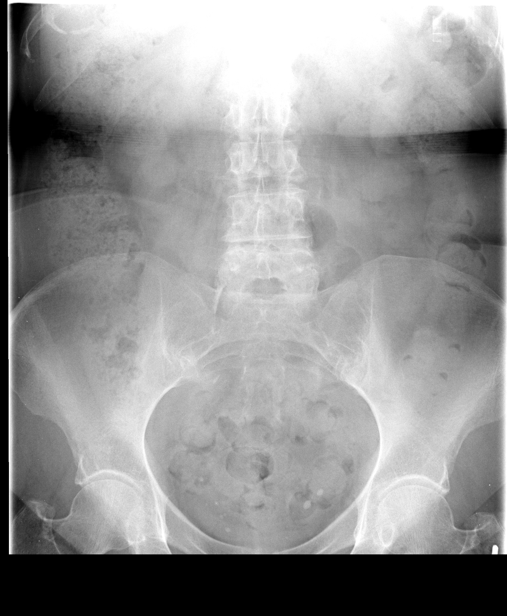

[15 of 24 positions shown; findings below may reference images not displayed]

FINDINGS: Preliminary KUB reveals mild constipation.  No bowel
obstruction.

Mild decreased esophageal motility distally.  Negative for
stricture or mass lesion.  Barium tablet passed readily into the
stomach without delay.

There is a large hiatal hernia.  This appears to be a sliding type
hiatal hernia.  No reflux was demonstrated.

No gastric mass or edema is seen.  No ulcer is present.  Duodenal
bulb is normal.
IMPRESSION: Mild esophageal dysmotility distally.  No esophageal stricture is
present.

Large hiatal hernia without reflux.

## 2014-01-16 DIAGNOSIS — E039 Hypothyroidism, unspecified: Secondary | ICD-10-CM | POA: Diagnosis not present

## 2014-01-16 DIAGNOSIS — F988 Other specified behavioral and emotional disorders with onset usually occurring in childhood and adolescence: Secondary | ICD-10-CM | POA: Diagnosis not present

## 2014-01-16 DIAGNOSIS — M899 Disorder of bone, unspecified: Secondary | ICD-10-CM | POA: Diagnosis not present

## 2014-01-16 DIAGNOSIS — Z0000 Encounter for general adult medical examination without abnormal findings: Secondary | ICD-10-CM | POA: Diagnosis not present

## 2014-01-16 DIAGNOSIS — Z23 Encounter for immunization: Secondary | ICD-10-CM | POA: Diagnosis not present

## 2014-01-16 DIAGNOSIS — D509 Iron deficiency anemia, unspecified: Secondary | ICD-10-CM | POA: Diagnosis not present

## 2014-01-16 DIAGNOSIS — I10 Essential (primary) hypertension: Secondary | ICD-10-CM | POA: Diagnosis not present

## 2014-01-16 DIAGNOSIS — R7301 Impaired fasting glucose: Secondary | ICD-10-CM | POA: Diagnosis not present

## 2014-01-16 DIAGNOSIS — E538 Deficiency of other specified B group vitamins: Secondary | ICD-10-CM | POA: Diagnosis not present

## 2014-02-03 ENCOUNTER — Ambulatory Visit (INDEPENDENT_AMBULATORY_CARE_PROVIDER_SITE_OTHER): Payer: 59 | Admitting: Surgery

## 2014-02-13 IMAGING — CR DG CHEST 2V
2 series · 2 of 2 positions shown · non-contrast
Comparison: 10/22/2009

CLINICAL DATA: Pre operative respiratory EXAM. Hiatal hernia.

EXAM:
CHEST  2 VIEW

[w chest pa]
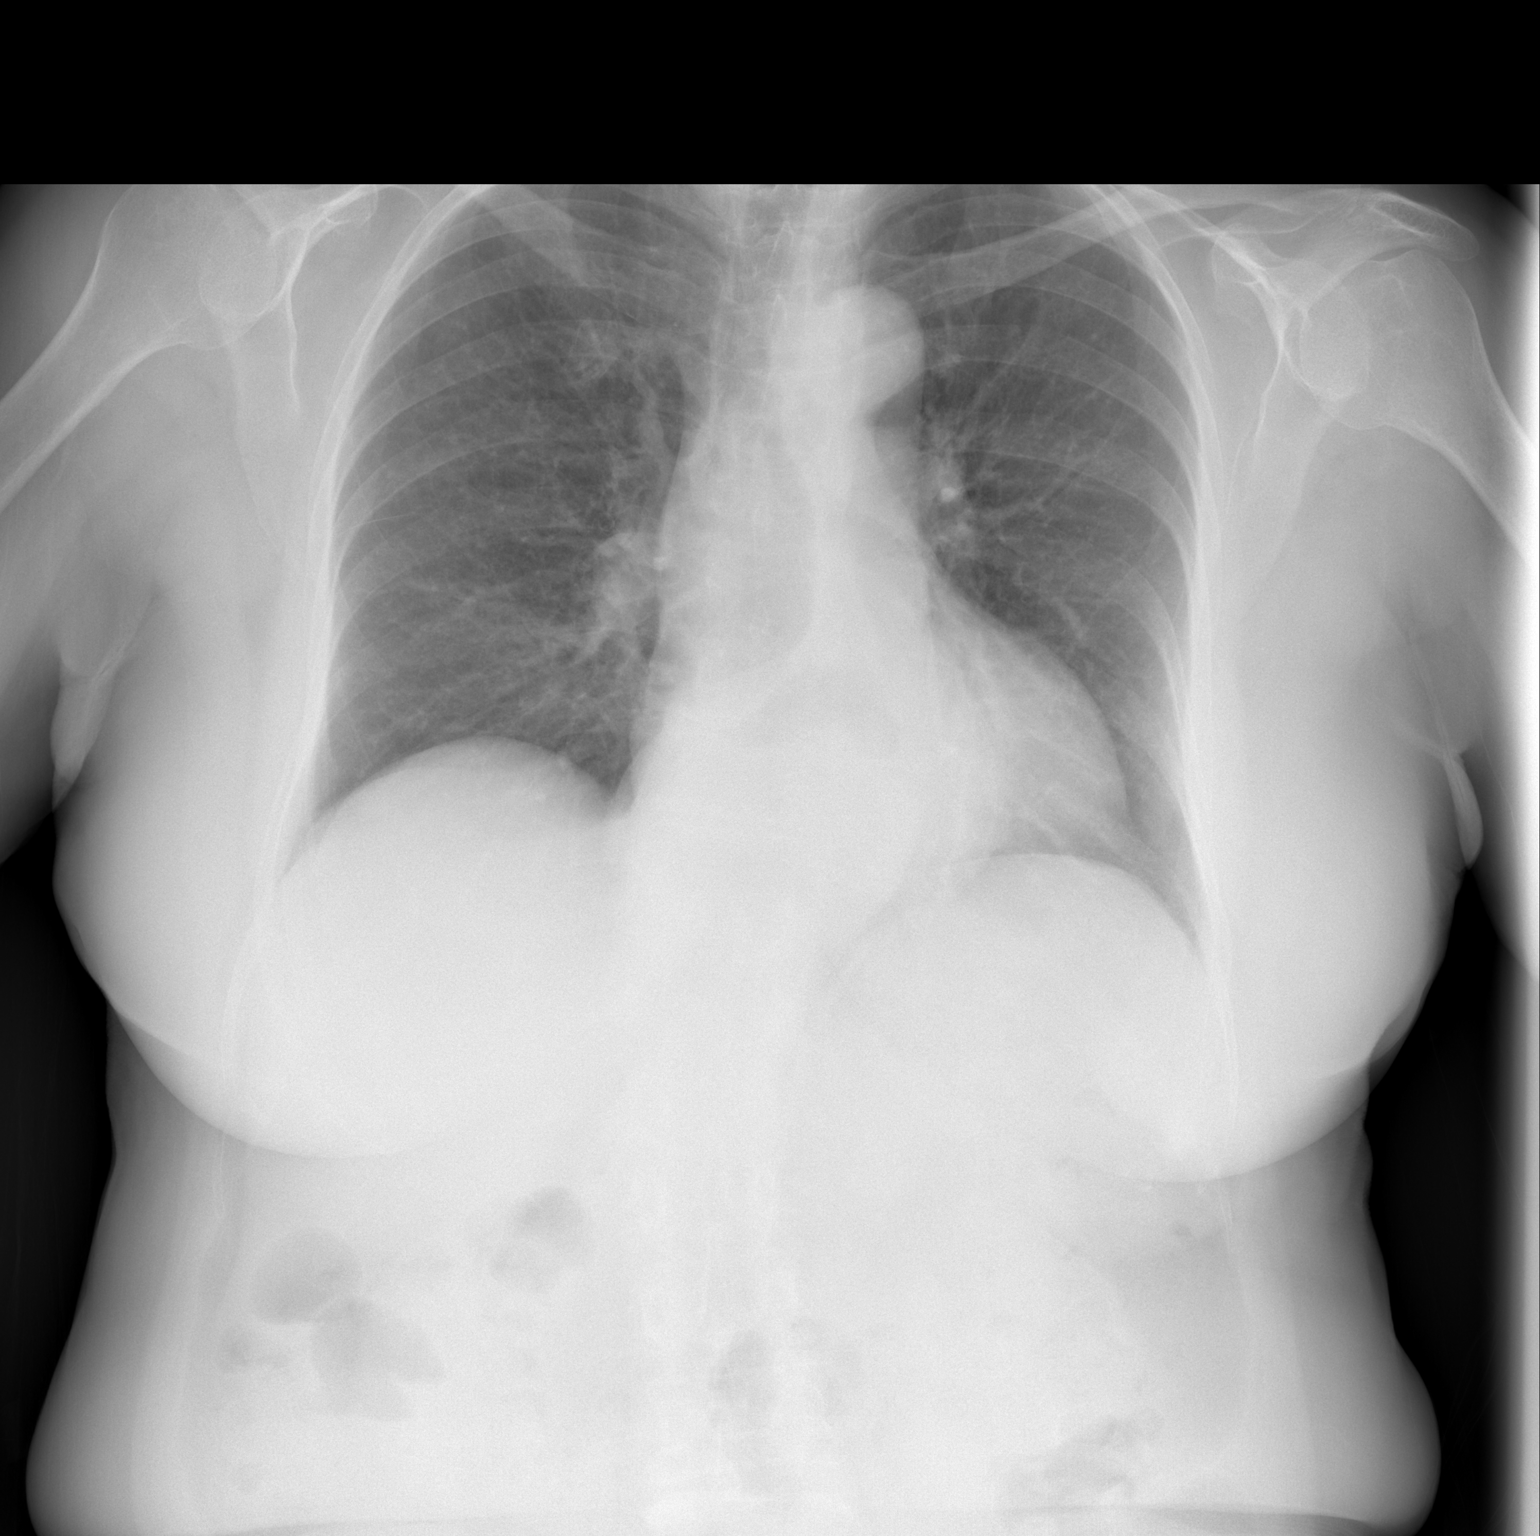

[w chest lat]
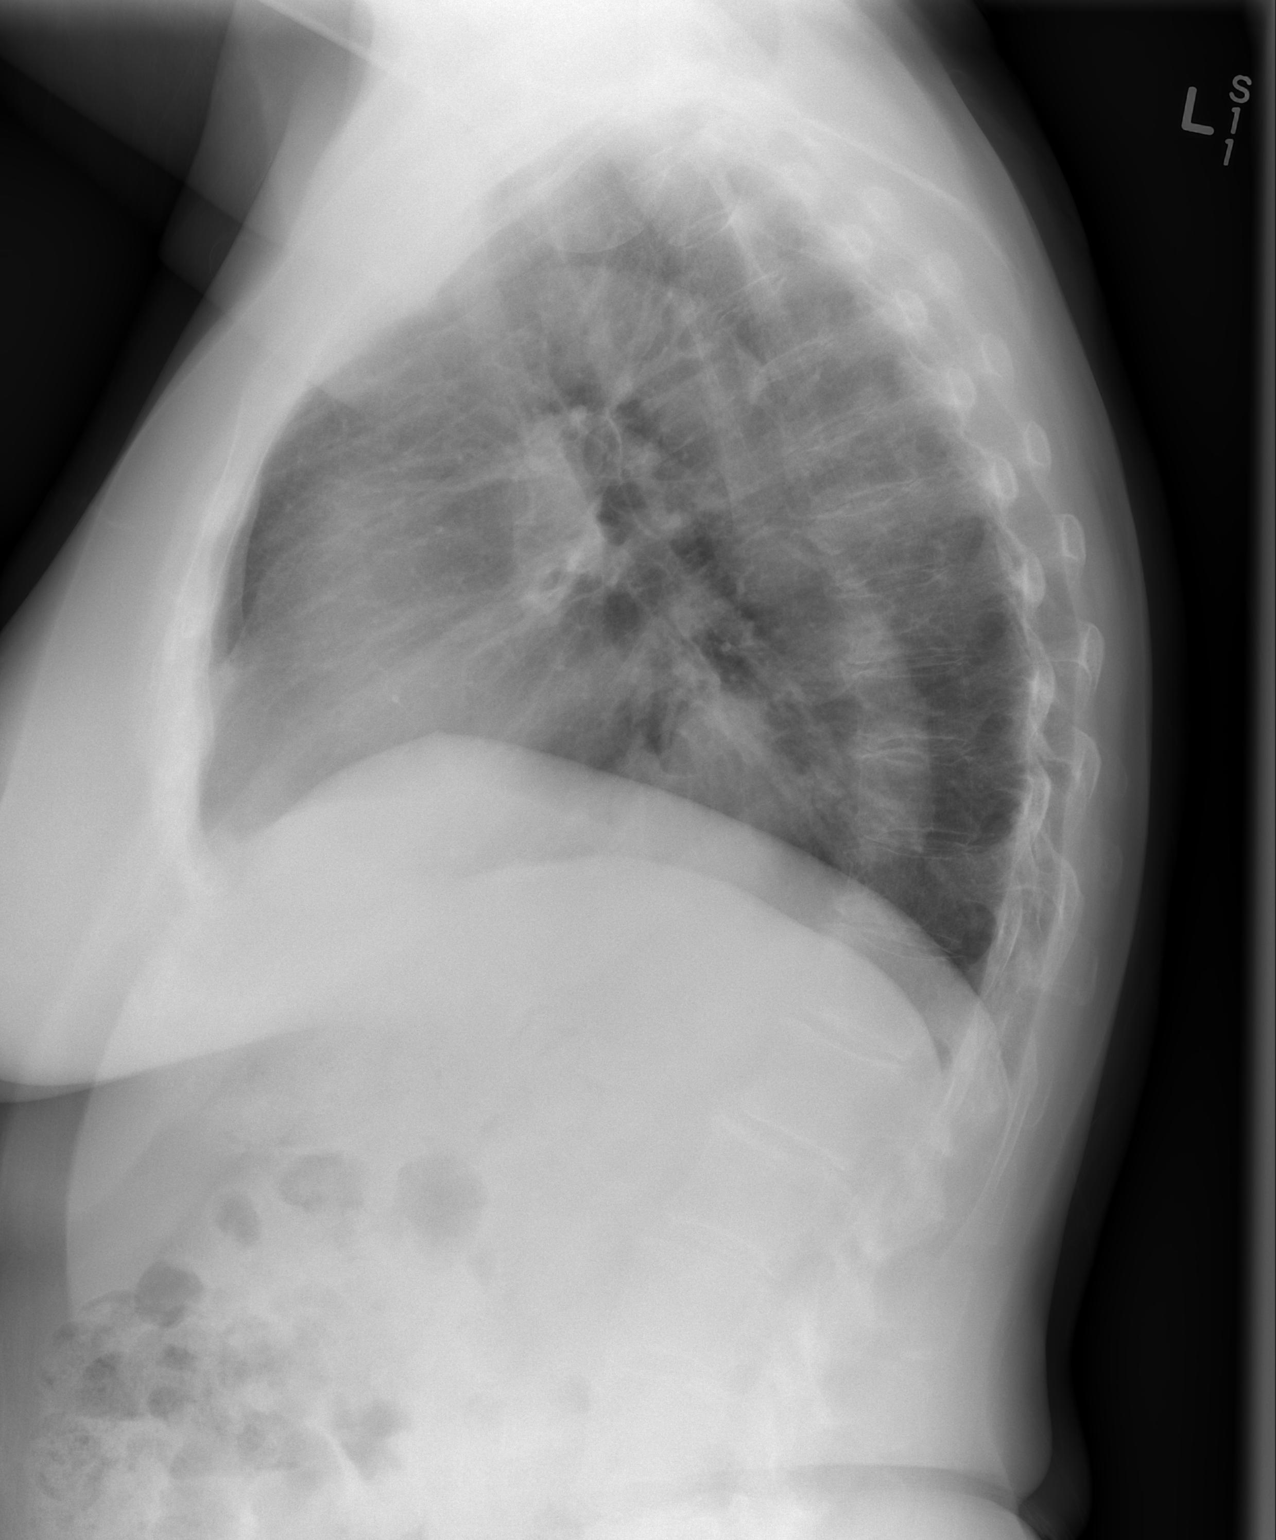

[2 of 2 positions shown; findings below may reference images not displayed]

FINDINGS: The heart size and pulmonary vascularity are normal the lungs are
clear. Slight tortuosity of the thoracic aorta. Large hiatal hernia
is noted. Slight thoracolumbar scoliosis. No acute osseous
abnormality.
IMPRESSION: No active cardiopulmonary disease.  Large hiatal hernia.

## 2014-02-20 DIAGNOSIS — S0083XA Contusion of other part of head, initial encounter: Secondary | ICD-10-CM | POA: Diagnosis not present

## 2014-02-20 DIAGNOSIS — S0003XA Contusion of scalp, initial encounter: Secondary | ICD-10-CM | POA: Diagnosis not present

## 2014-02-20 DIAGNOSIS — S1093XA Contusion of unspecified part of neck, initial encounter: Secondary | ICD-10-CM | POA: Diagnosis not present

## 2014-02-20 DIAGNOSIS — E039 Hypothyroidism, unspecified: Secondary | ICD-10-CM | POA: Diagnosis not present

## 2014-02-20 DIAGNOSIS — I10 Essential (primary) hypertension: Secondary | ICD-10-CM | POA: Diagnosis not present

## 2014-02-20 DIAGNOSIS — M1990 Unspecified osteoarthritis, unspecified site: Secondary | ICD-10-CM | POA: Diagnosis not present

## 2014-02-20 DIAGNOSIS — M79609 Pain in unspecified limb: Secondary | ICD-10-CM | POA: Diagnosis not present

## 2014-02-20 DIAGNOSIS — K449 Diaphragmatic hernia without obstruction or gangrene: Secondary | ICD-10-CM | POA: Diagnosis not present

## 2014-03-29 ENCOUNTER — Encounter (INDEPENDENT_AMBULATORY_CARE_PROVIDER_SITE_OTHER): Payer: Self-pay | Admitting: Surgery

## 2014-03-29 ENCOUNTER — Ambulatory Visit (INDEPENDENT_AMBULATORY_CARE_PROVIDER_SITE_OTHER): Payer: 59 | Admitting: Surgery

## 2014-03-29 VITALS — BP 136/84 | HR 78 | Temp 97.6°F | Resp 16 | Ht 63.0 in | Wt 160.0 lb

## 2014-03-29 DIAGNOSIS — Z09 Encounter for follow-up examination after completed treatment for conditions other than malignant neoplasm: Secondary | ICD-10-CM

## 2014-03-29 DIAGNOSIS — Z9889 Other specified postprocedural states: Secondary | ICD-10-CM

## 2014-03-29 NOTE — Progress Notes (Signed)
Valerie Davis 73 y.o.  Body mass index is 28.35 kg/(m^2).  Patient Active Problem List   Diagnosis Date Noted  . Repair large type III mixed hiatus hernia with Nissen fundoplication over a 56 bougie October 2014 09/01/2013    Priority: High  . Chronic upper GI bleeding 08/19/2013  . Cameron ulcer 08/19/2013  . Orthostasis 08/19/2013  . Near syncope 08/19/2013  . Personal history of colonic adenoma 07/06/2013  . INSOMNIA-SLEEP DISORDER-UNSPEC 09/05/2010  . NEURALGIA, TRIGEMINAL 06/06/2010  . HYPOTHYROIDISM 04/26/2010  . ANEMIA-IRON DEFICIENCY 04/26/2010  . HYPERTENSION 04/26/2010  . ANEMIA, B12 DEFICIENCY 12/17/2009    Allergies  Allergen Reactions  . Ace Inhibitors     REACTION: cough  . Amlodipine Besylate     REACTION: malaise   . Bupropion Hcl     REACTION: urticaria, angioedema caused by wellbutrin  . Levofloxacin     REACTION: abdominal pain  . Pantoprazole Sodium     REACTION: rash    Past Surgical History  Procedure Laterality Date  . Vaginal hysterectomy    . Bilateral salpingoophorectomy    . Knee menscectomy Right 07/2009  . Colonoscopy    . Upper gastrointestinal endoscopy    . Abdominal hysterectomy  age 89  . Hiatal hernia repair N/A 08/30/2013    Procedure: LAPAROSCOPIC REPAIR OF HIATAL HERNIA;  Surgeon: Pedro Earls, MD;  Location: [facility];  Service: General;  Laterality: N/A;  With MESH   Marton Redwood, MD No diagnosis found.  Doing well.  No reflux.  Eats more slowly now.  Has been back at work.  May resume Celebrex.   Return prn.  Matt B. Hassell Done, MD, [facility] [phone] beeper [phone]  03/29/2014 4:23 PM

## 2014-03-29 NOTE — Patient Instructions (Signed)
Thanks for your patience.  If you need further assistance after leaving the office, please call our office and speak with a [facility] nurse.  [phone].  If you want to leave a message for Dr. Rashelle Ireland, please call his office phone at [phone]. 

## 2014-05-18 DIAGNOSIS — M1710 Unilateral primary osteoarthritis, unspecified knee: Secondary | ICD-10-CM | POA: Diagnosis not present

## 2014-07-29 DIAGNOSIS — Z23 Encounter for immunization: Secondary | ICD-10-CM | POA: Diagnosis not present

## 2014-08-17 DIAGNOSIS — M25562 Pain in left knee: Secondary | ICD-10-CM | POA: Diagnosis not present

## 2014-08-17 DIAGNOSIS — M170 Bilateral primary osteoarthritis of knee: Secondary | ICD-10-CM | POA: Diagnosis not present

## 2014-08-17 DIAGNOSIS — M25561 Pain in right knee: Secondary | ICD-10-CM | POA: Diagnosis not present

## 2014-08-30 DIAGNOSIS — M79609 Pain in unspecified limb: Secondary | ICD-10-CM | POA: Diagnosis not present

## 2014-08-30 DIAGNOSIS — M159 Polyosteoarthritis, unspecified: Secondary | ICD-10-CM | POA: Diagnosis not present

## 2014-09-14 DIAGNOSIS — M19072 Primary osteoarthritis, left ankle and foot: Secondary | ICD-10-CM | POA: Diagnosis not present

## 2014-09-14 DIAGNOSIS — M19071 Primary osteoarthritis, right ankle and foot: Secondary | ICD-10-CM | POA: Diagnosis not present

## 2014-09-25 ENCOUNTER — Inpatient Hospital Stay (HOSPITAL_COMMUNITY): Admit: 2014-09-25 | Payer: 59 | Admitting: Orthopedic Surgery

## 2014-09-25 ENCOUNTER — Encounter (HOSPITAL_COMMUNITY): Payer: Self-pay

## 2014-09-25 SURGERY — ARTHROPLASTY, KNEE, TOTAL
Anesthesia: Spinal | Site: Knee | Laterality: Right

## 2014-09-29 DIAGNOSIS — M19072 Primary osteoarthritis, left ankle and foot: Secondary | ICD-10-CM | POA: Diagnosis not present

## 2014-09-29 DIAGNOSIS — M19071 Primary osteoarthritis, right ankle and foot: Secondary | ICD-10-CM | POA: Diagnosis not present

## 2014-10-06 DIAGNOSIS — R300 Dysuria: Secondary | ICD-10-CM | POA: Diagnosis not present

## 2014-10-17 DIAGNOSIS — M25562 Pain in left knee: Secondary | ICD-10-CM | POA: Diagnosis not present

## 2014-10-17 DIAGNOSIS — M25561 Pain in right knee: Secondary | ICD-10-CM | POA: Diagnosis not present

## 2014-10-17 DIAGNOSIS — M1711 Unilateral primary osteoarthritis, right knee: Secondary | ICD-10-CM | POA: Diagnosis not present

## 2014-10-17 DIAGNOSIS — M1712 Unilateral primary osteoarthritis, left knee: Secondary | ICD-10-CM | POA: Diagnosis not present

## 2014-10-24 DIAGNOSIS — M25562 Pain in left knee: Secondary | ICD-10-CM | POA: Diagnosis not present

## 2014-10-24 DIAGNOSIS — M25561 Pain in right knee: Secondary | ICD-10-CM | POA: Diagnosis not present

## 2014-10-24 DIAGNOSIS — M1712 Unilateral primary osteoarthritis, left knee: Secondary | ICD-10-CM | POA: Diagnosis not present

## 2014-10-24 DIAGNOSIS — M1711 Unilateral primary osteoarthritis, right knee: Secondary | ICD-10-CM | POA: Diagnosis not present

## 2014-11-03 DIAGNOSIS — M1712 Unilateral primary osteoarthritis, left knee: Secondary | ICD-10-CM | POA: Diagnosis not present

## 2014-11-03 DIAGNOSIS — M1711 Unilateral primary osteoarthritis, right knee: Secondary | ICD-10-CM | POA: Diagnosis not present

## 2014-12-15 DIAGNOSIS — M1711 Unilateral primary osteoarthritis, right knee: Secondary | ICD-10-CM | POA: Diagnosis not present

## 2014-12-15 DIAGNOSIS — M25562 Pain in left knee: Secondary | ICD-10-CM | POA: Diagnosis not present

## 2014-12-15 DIAGNOSIS — M1712 Unilateral primary osteoarthritis, left knee: Secondary | ICD-10-CM | POA: Diagnosis not present

## 2015-01-23 DIAGNOSIS — Z0000 Encounter for general adult medical examination without abnormal findings: Secondary | ICD-10-CM | POA: Diagnosis not present

## 2015-01-23 DIAGNOSIS — K449 Diaphragmatic hernia without obstruction or gangrene: Secondary | ICD-10-CM | POA: Diagnosis not present

## 2015-01-23 DIAGNOSIS — M159 Polyosteoarthritis, unspecified: Secondary | ICD-10-CM | POA: Diagnosis not present

## 2015-01-23 DIAGNOSIS — Z6829 Body mass index (BMI) 29.0-29.9, adult: Secondary | ICD-10-CM | POA: Diagnosis not present

## 2015-01-23 DIAGNOSIS — I10 Essential (primary) hypertension: Secondary | ICD-10-CM | POA: Diagnosis not present

## 2015-01-23 DIAGNOSIS — M8580 Other specified disorders of bone density and structure, unspecified site: Secondary | ICD-10-CM | POA: Diagnosis not present

## 2015-01-23 DIAGNOSIS — E039 Hypothyroidism, unspecified: Secondary | ICD-10-CM | POA: Diagnosis not present

## 2015-01-23 DIAGNOSIS — D509 Iron deficiency anemia, unspecified: Secondary | ICD-10-CM | POA: Diagnosis not present

## 2015-01-23 DIAGNOSIS — F900 Attention-deficit hyperactivity disorder, predominantly inattentive type: Secondary | ICD-10-CM | POA: Diagnosis not present

## 2015-01-23 DIAGNOSIS — M79609 Pain in unspecified limb: Secondary | ICD-10-CM | POA: Diagnosis not present

## 2015-01-23 DIAGNOSIS — R7301 Impaired fasting glucose: Secondary | ICD-10-CM | POA: Diagnosis not present

## 2015-01-23 DIAGNOSIS — E538 Deficiency of other specified B group vitamins: Secondary | ICD-10-CM | POA: Diagnosis not present

## 2015-01-30 DIAGNOSIS — Z1231 Encounter for screening mammogram for malignant neoplasm of breast: Secondary | ICD-10-CM | POA: Diagnosis not present

## 2015-03-29 DIAGNOSIS — M170 Bilateral primary osteoarthritis of knee: Secondary | ICD-10-CM | POA: Diagnosis not present

## 2015-03-29 DIAGNOSIS — M25561 Pain in right knee: Secondary | ICD-10-CM | POA: Diagnosis not present

## 2015-03-29 DIAGNOSIS — M25562 Pain in left knee: Secondary | ICD-10-CM | POA: Diagnosis not present

## 2015-03-29 DIAGNOSIS — M2241 Chondromalacia patellae, right knee: Secondary | ICD-10-CM | POA: Diagnosis not present

## 2015-03-29 DIAGNOSIS — M7542 Impingement syndrome of left shoulder: Secondary | ICD-10-CM | POA: Diagnosis not present

## 2015-04-01 ENCOUNTER — Emergency Department (HOSPITAL_COMMUNITY)
Admission: EM | Admit: 2015-04-01 | Discharge: 2015-04-01 | Disposition: A | Discharge status: Home / Self Care | Payer: Worker's Compensation | Attending: Emergency Medicine | Admitting: Emergency Medicine

## 2015-04-01 ENCOUNTER — Encounter (HOSPITAL_COMMUNITY): Payer: Self-pay | Admitting: *Deleted

## 2015-04-01 ENCOUNTER — Emergency Department (HOSPITAL_COMMUNITY): Payer: Worker's Compensation

## 2015-04-01 DIAGNOSIS — Y998 Other external cause status: Secondary | ICD-10-CM | POA: Diagnosis not present

## 2015-04-01 DIAGNOSIS — Z79899 Other long term (current) drug therapy: Secondary | ICD-10-CM | POA: Diagnosis not present

## 2015-04-01 DIAGNOSIS — F909 Attention-deficit hyperactivity disorder, unspecified type: Secondary | ICD-10-CM | POA: Insufficient documentation

## 2015-04-01 DIAGNOSIS — E039 Hypothyroidism, unspecified: Secondary | ICD-10-CM | POA: Diagnosis not present

## 2015-04-01 DIAGNOSIS — Y9301 Activity, walking, marching and hiking: Secondary | ICD-10-CM | POA: Diagnosis not present

## 2015-04-01 DIAGNOSIS — S90811A Abrasion, right foot, initial encounter: Secondary | ICD-10-CM | POA: Diagnosis not present

## 2015-04-01 DIAGNOSIS — Z87891 Personal history of nicotine dependence: Secondary | ICD-10-CM | POA: Insufficient documentation

## 2015-04-01 DIAGNOSIS — Z8719 Personal history of other diseases of the digestive system: Secondary | ICD-10-CM | POA: Diagnosis not present

## 2015-04-01 DIAGNOSIS — Y9289 Other specified places as the place of occurrence of the external cause: Secondary | ICD-10-CM | POA: Diagnosis not present

## 2015-04-01 DIAGNOSIS — M1990 Unspecified osteoarthritis, unspecified site: Secondary | ICD-10-CM | POA: Insufficient documentation

## 2015-04-01 DIAGNOSIS — W010XXA Fall on same level from slipping, tripping and stumbling without subsequent striking against object, initial encounter: Secondary | ICD-10-CM | POA: Diagnosis not present

## 2015-04-01 DIAGNOSIS — M8580 Other specified disorders of bone density and structure, unspecified site: Secondary | ICD-10-CM | POA: Insufficient documentation

## 2015-04-01 DIAGNOSIS — T148 Other injury of unspecified body region: Secondary | ICD-10-CM | POA: Diagnosis not present

## 2015-04-01 DIAGNOSIS — J45909 Unspecified asthma, uncomplicated: Secondary | ICD-10-CM | POA: Insufficient documentation

## 2015-04-01 DIAGNOSIS — F329 Major depressive disorder, single episode, unspecified: Secondary | ICD-10-CM | POA: Diagnosis not present

## 2015-04-01 DIAGNOSIS — S7002XA Contusion of left hip, initial encounter: Secondary | ICD-10-CM

## 2015-04-01 DIAGNOSIS — I10 Essential (primary) hypertension: Secondary | ICD-10-CM | POA: Diagnosis not present

## 2015-04-01 DIAGNOSIS — W19XXXA Unspecified fall, initial encounter: Secondary | ICD-10-CM

## 2015-04-01 DIAGNOSIS — G4700 Insomnia, unspecified: Secondary | ICD-10-CM | POA: Diagnosis not present

## 2015-04-01 DIAGNOSIS — Z86018 Personal history of other benign neoplasm: Secondary | ICD-10-CM | POA: Diagnosis not present

## 2015-04-01 DIAGNOSIS — M542 Cervicalgia: Secondary | ICD-10-CM | POA: Diagnosis not present

## 2015-04-01 DIAGNOSIS — S79912A Unspecified injury of left hip, initial encounter: Secondary | ICD-10-CM | POA: Diagnosis present

## 2015-04-01 DIAGNOSIS — D509 Iron deficiency anemia, unspecified: Secondary | ICD-10-CM | POA: Insufficient documentation

## 2015-04-01 MED ORDER — OXYCODONE-ACETAMINOPHEN 5-325 MG PO TABS: 1.0000 | ORAL_TABLET | Freq: Four times a day (QID) | ORAL | Status: DC | PRN

## 2015-04-01 NOTE — ED Notes (Signed)
Bed: WA22 Expected date:  Expected time:  Means of arrival:  Comments: EMS-fall 

## 2015-04-01 NOTE — ED Provider Notes (Signed)
CSN: [id number]     Arrival date & time 04/01/15  1323 History   First MD Initiated Contact with Patient 04/01/15 1352     Chief Complaint  Patient presents with  . Fall  . Knee Pain     (Consider location/radiation/quality/duration/timing/severity/associated sxs/prior Treatment) Patient is a 74 y.o. female presenting with fall. The history is provided by the patient.  Fall This is a new problem. The current episode started 1 to 2 hours ago. Episode frequency: once. The problem has been resolved. Pertinent negatives include no chest pain, no abdominal pain, no headaches and no shortness of breath. Nothing aggravates the symptoms. Nothing relieves the symptoms. She has tried nothing for the symptoms. The treatment provided significant relief.    Past Medical History  Diagnosis Date  . Iron deficiency anemia   . Insomnia   . Hypothyroidism   . Hypertension   . H/O: GI bleed     erosive gastritis (NSAID) 2007 in [facility]  . ADD (attention deficit disorder)   . Depression   . GERD (gastroesophageal reflux disease)   . Osteopenia   . Diverticulosis   . Asthma   . Personal history of colonic adenoma 07/06/2013  . Arthritis   . Blood transfusion without reported diagnosis   . Trigeminal neuralgia      hx of   Past Surgical History  Procedure Laterality Date  . Vaginal hysterectomy    . Bilateral salpingoophorectomy    . Knee menscectomy Right 07/2009  . Colonoscopy    . Upper gastrointestinal endoscopy    . Abdominal hysterectomy  age 74  . Hiatal hernia repair N/A 08/30/2013    Procedure: LAPAROSCOPIC REPAIR OF HIATAL HERNIA;  Surgeon: Pedro Earls, MD;  Location: [facility];  Service: General;  Laterality: N/A;  With MESH   Family History  Problem Relation Age of Onset  . Heart attack Father 41  . Colon cancer Neg Hx   . Rectal cancer Neg Hx   . Stroke Mother   . Stomach cancer Maternal Grandfather   . Cancer Sister     sarcoma   History  Substance Use Topics  . Smoking  status: Former Smoker    Quit date: 11/17/1997  . Smokeless tobacco: Never Used  . Alcohol Use: Yes     Comment: occasional   OB History    No data available     Review of Systems  Constitutional: Negative for fever and fatigue.  HENT: Negative for congestion and drooling.   Eyes: Negative for pain.  Respiratory: Negative for cough and shortness of breath.   Cardiovascular: Negative for chest pain.  Gastrointestinal: Negative for nausea, vomiting, abdominal pain and diarrhea.  Genitourinary: Negative for dysuria and hematuria.  Musculoskeletal: Negative for back pain, gait problem and neck pain.  Skin: Negative for color change.  Neurological: Negative for dizziness and headaches.  Hematological: Negative for adenopathy.  Psychiatric/Behavioral: Negative for behavioral problems.  All other systems reviewed and are negative.     Allergies  Ace inhibitors; Amlodipine besylate; Bupropion hcl; Levofloxacin; and Pantoprazole sodium  Home Medications   Prior to Admission medications   Medication Sig Start Date End Date Taking? Authorizing Provider  amphetamine-dextroamphetamine (ADDERALL) 20 MG tablet Take 20 mg by mouth 3 (three) times daily.    Historical Provider, MD  cetirizine (ZYRTEC) 10 MG tablet Take 10 mg by mouth daily.    Historical Provider, MD  Cholecalciferol (VITAMIN D) 2000 UNITS CAPS Take 1 capsule by mouth daily.  Historical Provider, MD  cyanocobalamin (,VITAMIN B-12,) 1000 MCG/ML injection Inject 1,000 mcg into the muscle every 30 (thirty) days.  04/13/13   Gatha Mayer, MD  DULoxetine (CYMBALTA) 60 MG capsule Take 60 mg by mouth every evening. One tablet by mouth once daily 03/18/13   Historical Provider, MD  ferrous sulfate 325 (65 FE) MG tablet Take 1 tablet (325 mg total) by mouth daily. 04/13/13   Gatha Mayer, MD  levothyroxine (SYNTHROID, LEVOTHROID) 88 MCG tablet Take 88 mcg by mouth daily before breakfast.     Historical Provider, MD   losartan-hydrochlorothiazide (HYZAAR) 100-12.5 MG per tablet Take 1 tablet by mouth every morning.     Historical Provider, MD  ondansetron (ZOFRAN) 4 MG tablet Take 1 tablet (4 mg total) by mouth every 6 (six) hours as needed for nausea. 09/01/13   Johnathan Hausen, MD  zolpidem (AMBIEN) 10 MG tablet Take 1 tablet (10 mg total) by mouth at bedtime as needed for sleep. 04/13/13   Gatha Mayer, MD   BP 135/82 mmHg  Pulse 79  Temp(Src) 97.4 F (36.3 C) (Oral)  Resp 18  SpO2 98% Physical Exam  Constitutional: She is oriented to person, place, and time. She appears well-developed and well-nourished.  HENT:  Head: Normocephalic and atraumatic.  Mouth/Throat: Oropharynx is clear and moist. No oropharyngeal exudate.  Eyes: Conjunctivae and EOM are normal. Pupils are equal, round, and reactive to light.  Neck: Normal range of motion. Neck supple.  Cardiovascular: Normal rate, regular rhythm, normal heart sounds and intact distal pulses.  Exam reveals no gallop and no friction rub.   No murmur heard. Pulmonary/Chest: Effort normal and breath sounds normal. No respiratory distress. She has no wheezes.  Abdominal: Soft. Bowel sounds are normal. There is no tenderness. There is no rebound and no guarding.  Musculoskeletal: Normal range of motion. She exhibits tenderness. She exhibits no edema.  Mild nonspecific tenderness of the dorsum of the right distal foot. Small abrasion in this area.   Mild tenderness of the left lateral hip.  Normal range of motion of the ankles, knees, and hips bilaterally.  Normal strength and sensation throughout.  No focal vertebral tenderness.  Neurological: She is alert and oriented to person, place, and time.  Skin: Skin is warm and dry.  Psychiatric: She has a normal mood and affect. Her behavior is normal.  Nursing note and vitals reviewed.   ED Course  Procedures (including critical care time) Labs Review Labs Reviewed - No data to display  Imaging  Review Dg Foot Complete Right  04/01/2015   CLINICAL DATA:  Acute right foot pain after falling on sidewalk. Initial encounter.  EXAM: RIGHT FOOT COMPLETE - 3+ VIEW  COMPARISON:  None.  FINDINGS: There is noted an old unhealed fracture involving the fourth proximal phalanx. Spurring of posterior calcaneus is noted. No acute fracture or dislocation is noted. Probable mild degenerative change of the first metatarsophalangeal joint is noted. Probable degenerative change involving several tarsometatarsal joints is noted as well.  IMPRESSION: Old unhealed fracture involving the fourth proximal phalanx. No acute fracture or dislocation is noted.   Electronically Signed   By: Marijo Conception, M.D.   On: 04/01/2015 15:00   Dg Hip Unilat With Pelvis 2-3 Views Left  04/01/2015   CLINICAL DATA:  Fall today  EXAM: LEFT HIP (WITH PELVIS) 2-3 VIEWS  COMPARISON:  None.  FINDINGS: No acute fracture.  No dislocation.  Osteopenia.  IMPRESSION: No acute bony pathology.  Electronically Signed   By: Marybelle Killings M.D.   On: 04/01/2015 14:58     EKG Interpretation None      MDM   Final diagnoses:  Fall  Abrasion of right foot, initial encounter  Contusion of left hip, initial encounter    2:37 PM 74 y.o. female w a hx of HTN who presents after a mechanical fall which occurred prior to arrival. The patient states she was walking into work when she tripped on a metal grate. She fell to the ground but broke her fall with her hands. She states that she landed on her knees. She has a small abrasion on the dorsal aspect of her right foot. She has some right foot pain and left hip pain. She denies hitting her head or loss of consciousness. No neck or back pain. Vital signs unremarkable here and the patient is well-appearing on exam. Minimal pain, she declines any pain medicine. Screening imaging.  Tdap updated.   3:36 PM: I interpreted/reviewed the labs and/or imaging which were non-contributory. Old unhealed fx seen,  pt knows about this injury, did not want post op shoe or boot. Pt ambulatory.  I have discussed the diagnosis/risks/treatment options with the patient and believe the pt to be eligible for discharge home to follow-up with her pcp. We also discussed returning to the ED immediately if new or worsening sx occur. We discussed the sx which are most concerning (e.g., worsening pain) that necessitate immediate return. Medications administered to the patient during their visit and any new prescriptions provided to the patient are listed below.  Medications given during this visit Medications - No data to display  New Prescriptions   OXYCODONE-ACETAMINOPHEN (PERCOCET) 5-325 MG PER TABLET    Take 1 tablet by mouth every 6 (six) hours as needed for moderate pain.     Pamella Pert, MD 04/01/15 1537

## 2015-04-01 NOTE — ED Notes (Addendum)
Pt from work via EMS after a falling on sidewalk. Pt c/o bilateral knee pain, R foot pain, neck and L hip pain. Pt denies hitting head or LOC. Pt does not take blood thinners. Pt was cleared from [facility] and denies neck or back tenderness to palpation.  Pt is A&O and in NAD

## 2015-04-02 DIAGNOSIS — Z6828 Body mass index (BMI) 28.0-28.9, adult: Secondary | ICD-10-CM | POA: Diagnosis not present

## 2015-04-02 DIAGNOSIS — W010XXA Fall on same level from slipping, tripping and stumbling without subsequent striking against object, initial encounter: Secondary | ICD-10-CM | POA: Diagnosis not present

## 2015-04-02 DIAGNOSIS — M79609 Pain in unspecified limb: Secondary | ICD-10-CM | POA: Diagnosis not present

## 2015-04-04 DIAGNOSIS — E039 Hypothyroidism, unspecified: Secondary | ICD-10-CM | POA: Diagnosis not present

## 2015-04-04 DIAGNOSIS — M170 Bilateral primary osteoarthritis of knee: Secondary | ICD-10-CM | POA: Diagnosis not present

## 2015-04-04 DIAGNOSIS — G8929 Other chronic pain: Secondary | ICD-10-CM | POA: Diagnosis not present

## 2015-04-10 DIAGNOSIS — M170 Bilateral primary osteoarthritis of knee: Secondary | ICD-10-CM | POA: Diagnosis not present

## 2015-04-10 DIAGNOSIS — M25562 Pain in left knee: Secondary | ICD-10-CM | POA: Diagnosis not present

## 2015-04-10 DIAGNOSIS — R9431 Abnormal electrocardiogram [ECG] [EKG]: Secondary | ICD-10-CM | POA: Diagnosis not present

## 2015-04-10 DIAGNOSIS — M25561 Pain in right knee: Secondary | ICD-10-CM | POA: Diagnosis not present

## 2015-04-10 DIAGNOSIS — Z01810 Encounter for preprocedural cardiovascular examination: Secondary | ICD-10-CM | POA: Diagnosis not present

## 2015-04-17 DIAGNOSIS — H2513 Age-related nuclear cataract, bilateral: Secondary | ICD-10-CM | POA: Diagnosis not present

## 2015-04-24 DIAGNOSIS — G8918 Other acute postprocedural pain: Secondary | ICD-10-CM | POA: Diagnosis not present

## 2015-04-24 DIAGNOSIS — M1711 Unilateral primary osteoarthritis, right knee: Secondary | ICD-10-CM | POA: Diagnosis not present

## 2015-04-25 DIAGNOSIS — G8918 Other acute postprocedural pain: Secondary | ICD-10-CM | POA: Diagnosis not present

## 2015-04-25 DIAGNOSIS — M1711 Unilateral primary osteoarthritis, right knee: Secondary | ICD-10-CM | POA: Diagnosis not present

## 2015-04-27 DIAGNOSIS — M25461 Effusion, right knee: Secondary | ICD-10-CM | POA: Diagnosis not present

## 2015-04-27 DIAGNOSIS — R262 Difficulty in walking, not elsewhere classified: Secondary | ICD-10-CM | POA: Diagnosis not present

## 2015-04-27 DIAGNOSIS — Z96651 Presence of right artificial knee joint: Secondary | ICD-10-CM | POA: Diagnosis not present

## 2015-04-27 DIAGNOSIS — M25661 Stiffness of right knee, not elsewhere classified: Secondary | ICD-10-CM | POA: Diagnosis not present

## 2015-06-21 ENCOUNTER — Encounter: Payer: 59 | Admitting: Podiatry

## 2015-06-22 NOTE — Progress Notes (Signed)
This encounter was created in error - please disregard.

## 2015-11-22 DIAGNOSIS — Z886 Allergy status to analgesic agent status: Secondary | ICD-10-CM | POA: Diagnosis not present

## 2015-11-22 DIAGNOSIS — M13871 Other specified arthritis, right ankle and foot: Secondary | ICD-10-CM | POA: Diagnosis not present

## 2015-11-22 DIAGNOSIS — I10 Essential (primary) hypertension: Secondary | ICD-10-CM | POA: Diagnosis not present

## 2015-11-22 DIAGNOSIS — Z471 Aftercare following joint replacement surgery: Secondary | ICD-10-CM | POA: Diagnosis not present

## 2015-11-22 DIAGNOSIS — M7061 Trochanteric bursitis, right hip: Secondary | ICD-10-CM | POA: Diagnosis not present

## 2015-11-22 DIAGNOSIS — Z96651 Presence of right artificial knee joint: Secondary | ICD-10-CM | POA: Diagnosis not present

## 2015-11-22 DIAGNOSIS — Z7982 Long term (current) use of aspirin: Secondary | ICD-10-CM | POA: Diagnosis not present

## 2015-11-22 DIAGNOSIS — Z79899 Other long term (current) drug therapy: Secondary | ICD-10-CM | POA: Diagnosis not present

## 2015-11-22 DIAGNOSIS — Z87891 Personal history of nicotine dependence: Secondary | ICD-10-CM | POA: Diagnosis not present

## 2015-11-22 DIAGNOSIS — Z888 Allergy status to other drugs, medicaments and biological substances status: Secondary | ICD-10-CM | POA: Diagnosis not present

## 2015-12-14 DIAGNOSIS — M170 Bilateral primary osteoarthritis of knee: Secondary | ICD-10-CM | POA: Diagnosis not present

## 2015-12-21 DIAGNOSIS — M859 Disorder of bone density and structure, unspecified: Secondary | ICD-10-CM | POA: Diagnosis not present

## 2015-12-21 DIAGNOSIS — I10 Essential (primary) hypertension: Secondary | ICD-10-CM | POA: Diagnosis not present

## 2016-01-03 DIAGNOSIS — H524 Presbyopia: Secondary | ICD-10-CM | POA: Diagnosis not present

## 2016-01-03 DIAGNOSIS — H25813 Combined forms of age-related cataract, bilateral: Secondary | ICD-10-CM | POA: Diagnosis not present

## 2016-01-17 DIAGNOSIS — N182 Chronic kidney disease, stage 2 (mild): Secondary | ICD-10-CM | POA: Diagnosis not present

## 2016-01-17 DIAGNOSIS — I10 Essential (primary) hypertension: Secondary | ICD-10-CM | POA: Diagnosis not present

## 2016-01-17 DIAGNOSIS — M859 Disorder of bone density and structure, unspecified: Secondary | ICD-10-CM | POA: Diagnosis not present

## 2016-01-17 DIAGNOSIS — Z6829 Body mass index (BMI) 29.0-29.9, adult: Secondary | ICD-10-CM | POA: Diagnosis not present

## 2016-01-21 DIAGNOSIS — H25811 Combined forms of age-related cataract, right eye: Secondary | ICD-10-CM | POA: Diagnosis not present

## 2016-01-21 DIAGNOSIS — H2511 Age-related nuclear cataract, right eye: Secondary | ICD-10-CM | POA: Diagnosis not present

## 2016-01-24 DIAGNOSIS — F900 Attention-deficit hyperactivity disorder, predominantly inattentive type: Secondary | ICD-10-CM | POA: Diagnosis not present

## 2016-01-24 DIAGNOSIS — E038 Other specified hypothyroidism: Secondary | ICD-10-CM | POA: Diagnosis not present

## 2016-01-24 DIAGNOSIS — E784 Other hyperlipidemia: Secondary | ICD-10-CM | POA: Diagnosis not present

## 2016-01-24 DIAGNOSIS — Z0000 Encounter for general adult medical examination without abnormal findings: Secondary | ICD-10-CM | POA: Diagnosis not present

## 2016-01-24 DIAGNOSIS — E538 Deficiency of other specified B group vitamins: Secondary | ICD-10-CM | POA: Diagnosis not present

## 2016-01-24 DIAGNOSIS — I10 Essential (primary) hypertension: Secondary | ICD-10-CM | POA: Diagnosis not present

## 2016-01-24 DIAGNOSIS — Z6829 Body mass index (BMI) 29.0-29.9, adult: Secondary | ICD-10-CM | POA: Diagnosis not present

## 2016-01-24 DIAGNOSIS — M79671 Pain in right foot: Secondary | ICD-10-CM | POA: Diagnosis not present

## 2016-01-24 DIAGNOSIS — M19071 Primary osteoarthritis, right ankle and foot: Secondary | ICD-10-CM | POA: Diagnosis not present

## 2016-01-24 DIAGNOSIS — R7301 Impaired fasting glucose: Secondary | ICD-10-CM | POA: Diagnosis not present

## 2016-01-24 DIAGNOSIS — M1990 Unspecified osteoarthritis, unspecified site: Secondary | ICD-10-CM | POA: Diagnosis not present

## 2016-01-24 DIAGNOSIS — M859 Disorder of bone density and structure, unspecified: Secondary | ICD-10-CM | POA: Diagnosis not present

## 2016-01-24 DIAGNOSIS — K219 Gastro-esophageal reflux disease without esophagitis: Secondary | ICD-10-CM | POA: Diagnosis not present

## 2016-01-24 DIAGNOSIS — Z1389 Encounter for screening for other disorder: Secondary | ICD-10-CM | POA: Diagnosis not present

## 2016-01-28 DIAGNOSIS — H25812 Combined forms of age-related cataract, left eye: Secondary | ICD-10-CM | POA: Diagnosis not present

## 2016-01-28 DIAGNOSIS — H2512 Age-related nuclear cataract, left eye: Secondary | ICD-10-CM | POA: Diagnosis not present

## 2016-02-21 DIAGNOSIS — M1990 Unspecified osteoarthritis, unspecified site: Secondary | ICD-10-CM | POA: Diagnosis not present

## 2016-02-25 DIAGNOSIS — J328 Other chronic sinusitis: Secondary | ICD-10-CM | POA: Diagnosis not present

## 2016-02-25 DIAGNOSIS — J301 Allergic rhinitis due to pollen: Secondary | ICD-10-CM | POA: Diagnosis not present

## 2016-02-25 DIAGNOSIS — Z6828 Body mass index (BMI) 28.0-28.9, adult: Secondary | ICD-10-CM | POA: Diagnosis not present

## 2016-04-16 DIAGNOSIS — I10 Essential (primary) hypertension: Secondary | ICD-10-CM | POA: Diagnosis not present

## 2016-04-16 DIAGNOSIS — Z79899 Other long term (current) drug therapy: Secondary | ICD-10-CM | POA: Diagnosis not present

## 2016-04-16 DIAGNOSIS — E039 Hypothyroidism, unspecified: Secondary | ICD-10-CM | POA: Diagnosis not present

## 2016-04-16 DIAGNOSIS — M19071 Primary osteoarthritis, right ankle and foot: Secondary | ICD-10-CM | POA: Diagnosis not present

## 2016-04-16 DIAGNOSIS — M19079 Primary osteoarthritis, unspecified ankle and foot: Secondary | ICD-10-CM | POA: Diagnosis not present

## 2016-07-17 DIAGNOSIS — Z87891 Personal history of nicotine dependence: Secondary | ICD-10-CM | POA: Diagnosis not present

## 2016-07-17 DIAGNOSIS — M25561 Pain in right knee: Secondary | ICD-10-CM | POA: Diagnosis not present

## 2016-07-17 DIAGNOSIS — Z471 Aftercare following joint replacement surgery: Secondary | ICD-10-CM | POA: Diagnosis not present

## 2016-07-17 DIAGNOSIS — Z96651 Presence of right artificial knee joint: Secondary | ICD-10-CM | POA: Diagnosis not present

## 2016-07-17 DIAGNOSIS — I10 Essential (primary) hypertension: Secondary | ICD-10-CM | POA: Diagnosis not present

## 2016-09-15 ENCOUNTER — Other Ambulatory Visit: Payer: Self-pay | Admitting: Internal Medicine

## 2016-09-15 DIAGNOSIS — R1011 Right upper quadrant pain: Secondary | ICD-10-CM

## 2016-09-24 ENCOUNTER — Ambulatory Visit
Admission: RE | Admit: 2016-09-24 | Discharge: 2016-09-24 | Disposition: A | Discharge status: Home / Self Care | Payer: 59 | Source: Ambulatory Visit | Attending: Internal Medicine | Admitting: Internal Medicine

## 2016-09-24 DIAGNOSIS — R1011 Right upper quadrant pain: Secondary | ICD-10-CM

## 2017-01-19 DIAGNOSIS — E538 Deficiency of other specified B group vitamins: Secondary | ICD-10-CM | POA: Diagnosis not present

## 2017-01-19 DIAGNOSIS — Z0000 Encounter for general adult medical examination without abnormal findings: Secondary | ICD-10-CM | POA: Diagnosis not present

## 2017-01-19 DIAGNOSIS — R7301 Impaired fasting glucose: Secondary | ICD-10-CM | POA: Diagnosis not present

## 2017-01-19 DIAGNOSIS — M859 Disorder of bone density and structure, unspecified: Secondary | ICD-10-CM | POA: Diagnosis not present

## 2017-01-19 DIAGNOSIS — E038 Other specified hypothyroidism: Secondary | ICD-10-CM | POA: Diagnosis not present

## 2017-01-27 DIAGNOSIS — M19071 Primary osteoarthritis, right ankle and foot: Secondary | ICD-10-CM | POA: Diagnosis not present

## 2017-01-27 DIAGNOSIS — M779 Enthesopathy, unspecified: Secondary | ICD-10-CM | POA: Diagnosis not present

## 2017-01-27 DIAGNOSIS — M960 Pseudarthrosis after fusion or arthrodesis: Secondary | ICD-10-CM | POA: Diagnosis not present

## 2017-01-27 DIAGNOSIS — Z981 Arthrodesis status: Secondary | ICD-10-CM | POA: Diagnosis not present

## 2017-01-27 DIAGNOSIS — T84213A Breakdown (mechanical) of internal fixation device of bones of foot and toes, initial encounter: Secondary | ICD-10-CM | POA: Diagnosis not present

## 2017-01-27 DIAGNOSIS — Z9889 Other specified postprocedural states: Secondary | ICD-10-CM | POA: Diagnosis not present

## 2017-01-27 DIAGNOSIS — M85871 Other specified disorders of bone density and structure, right ankle and foot: Secondary | ICD-10-CM | POA: Diagnosis not present

## 2017-01-28 DIAGNOSIS — E038 Other specified hypothyroidism: Secondary | ICD-10-CM | POA: Diagnosis not present

## 2017-01-28 DIAGNOSIS — E538 Deficiency of other specified B group vitamins: Secondary | ICD-10-CM | POA: Diagnosis not present

## 2017-01-28 DIAGNOSIS — R7301 Impaired fasting glucose: Secondary | ICD-10-CM | POA: Diagnosis not present

## 2017-01-28 DIAGNOSIS — M859 Disorder of bone density and structure, unspecified: Secondary | ICD-10-CM | POA: Diagnosis not present

## 2017-01-28 DIAGNOSIS — Z1389 Encounter for screening for other disorder: Secondary | ICD-10-CM | POA: Diagnosis not present

## 2017-01-28 DIAGNOSIS — F900 Attention-deficit hyperactivity disorder, predominantly inattentive type: Secondary | ICD-10-CM | POA: Diagnosis not present

## 2017-01-28 DIAGNOSIS — E784 Other hyperlipidemia: Secondary | ICD-10-CM | POA: Diagnosis not present

## 2017-01-28 DIAGNOSIS — Z0000 Encounter for general adult medical examination without abnormal findings: Secondary | ICD-10-CM | POA: Diagnosis not present

## 2017-01-28 DIAGNOSIS — I10 Essential (primary) hypertension: Secondary | ICD-10-CM | POA: Diagnosis not present

## 2017-01-28 DIAGNOSIS — Z6829 Body mass index (BMI) 29.0-29.9, adult: Secondary | ICD-10-CM | POA: Diagnosis not present

## 2017-01-28 DIAGNOSIS — K219 Gastro-esophageal reflux disease without esophagitis: Secondary | ICD-10-CM | POA: Diagnosis not present

## 2017-02-04 DIAGNOSIS — M8589 Other specified disorders of bone density and structure, multiple sites: Secondary | ICD-10-CM | POA: Diagnosis not present

## 2017-02-04 DIAGNOSIS — Z1231 Encounter for screening mammogram for malignant neoplasm of breast: Secondary | ICD-10-CM | POA: Diagnosis not present

## 2017-02-06 DIAGNOSIS — R922 Inconclusive mammogram: Secondary | ICD-10-CM | POA: Diagnosis not present

## 2017-02-11 DIAGNOSIS — M79671 Pain in right foot: Secondary | ICD-10-CM | POA: Diagnosis not present

## 2017-02-11 DIAGNOSIS — M9689 Other intraoperative and postprocedural complications and disorders of the musculoskeletal system: Secondary | ICD-10-CM | POA: Diagnosis not present

## 2017-02-11 DIAGNOSIS — Z9889 Other specified postprocedural states: Secondary | ICD-10-CM | POA: Diagnosis not present

## 2017-02-17 DIAGNOSIS — M9689 Other intraoperative and postprocedural complications and disorders of the musculoskeletal system: Secondary | ICD-10-CM | POA: Diagnosis not present

## 2017-02-23 DIAGNOSIS — H00024 Hordeolum internum left upper eyelid: Secondary | ICD-10-CM | POA: Diagnosis not present

## 2017-02-23 DIAGNOSIS — H26493 Other secondary cataract, bilateral: Secondary | ICD-10-CM | POA: Diagnosis not present

## 2017-02-23 DIAGNOSIS — H35411 Lattice degeneration of retina, right eye: Secondary | ICD-10-CM | POA: Diagnosis not present

## 2017-03-17 IMAGING — US US ABDOMEN LIMITED
1 series · 14 of 25 positions shown · non-contrast
Comparison: None.

CLINICAL DATA: Right upper quadrant abdominal pain for 6 months.

EXAM:
US ABDOMEN LIMITED - RIGHT UPPER QUADRANT

[Series 1: us abdomen limited · 0.26mm/px · 14 of 46 slices shown]
[im 1/46]
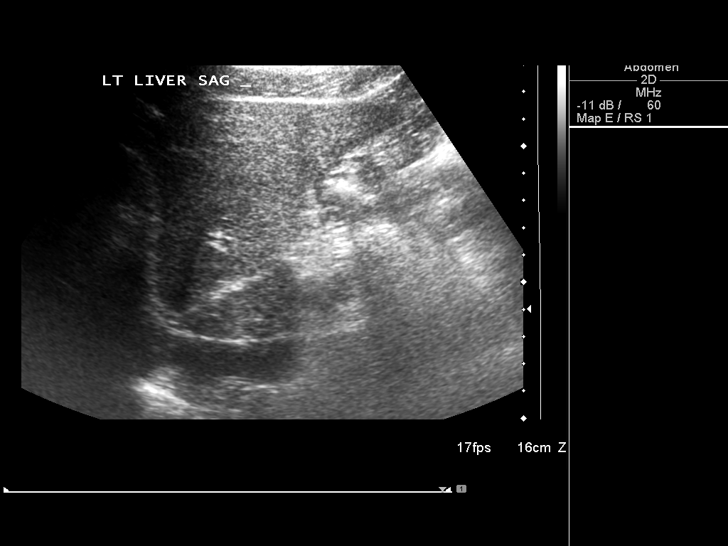
[im 4/46]
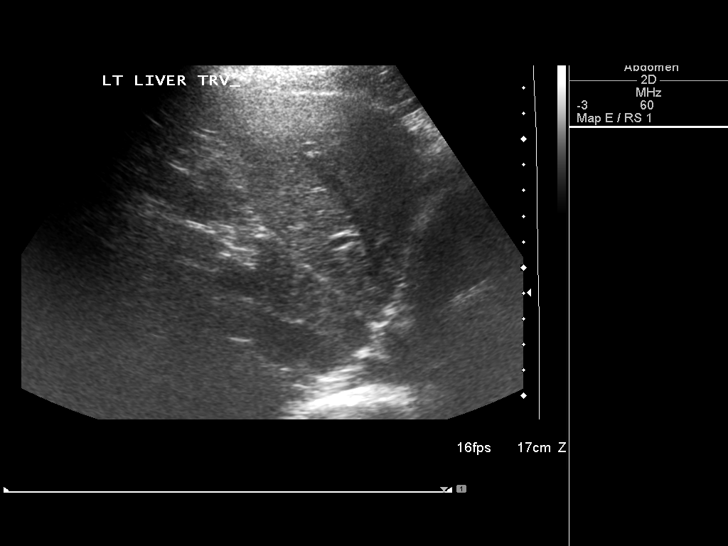
[im 8/46]
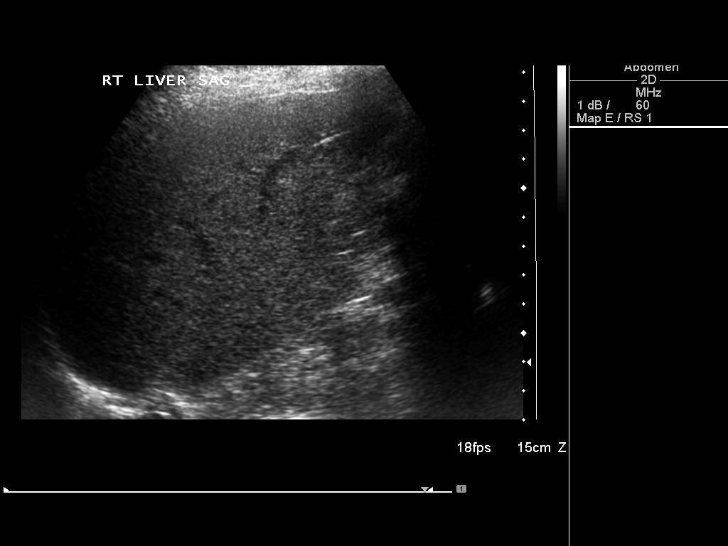
[im 12/46]
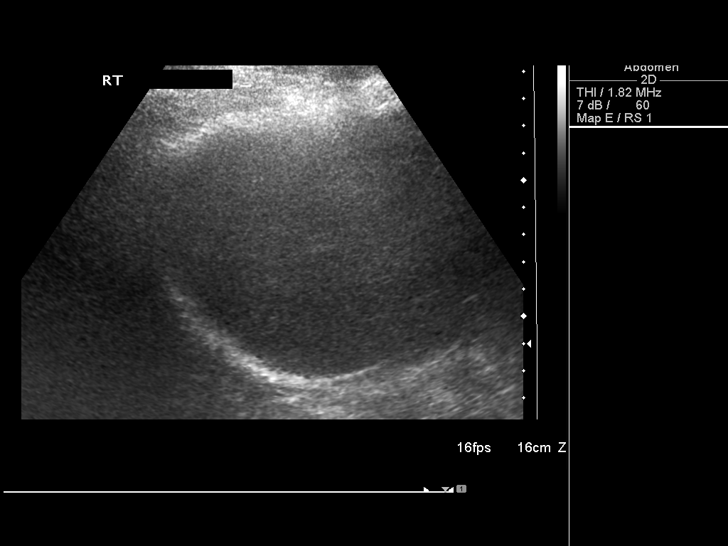
[im 16/46]
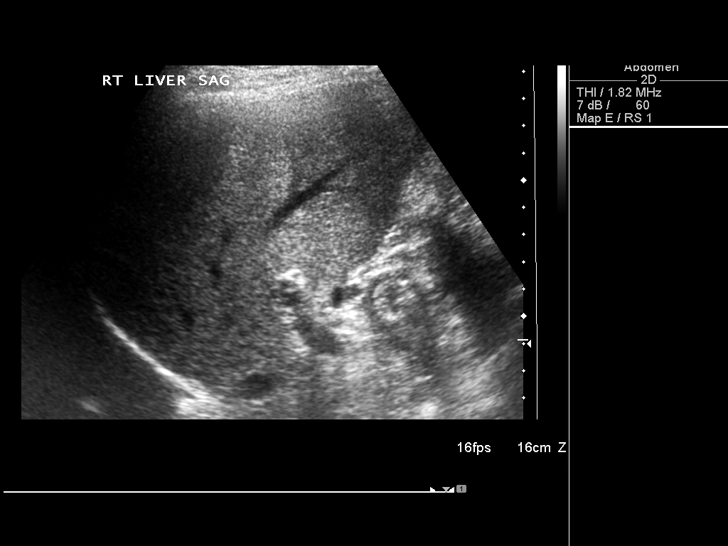
[im 17/46]
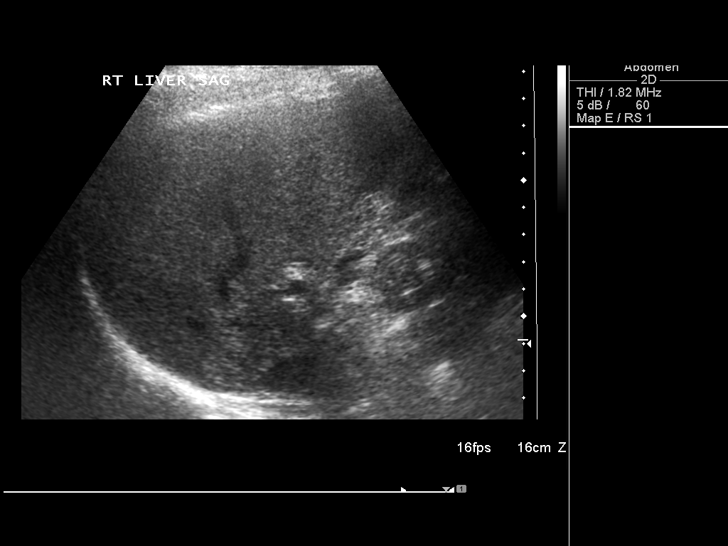
[im 21/46]
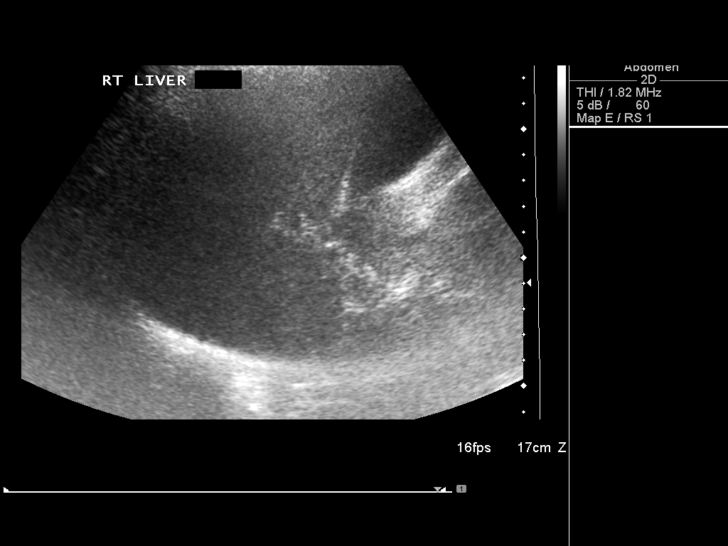
[im 25/46]
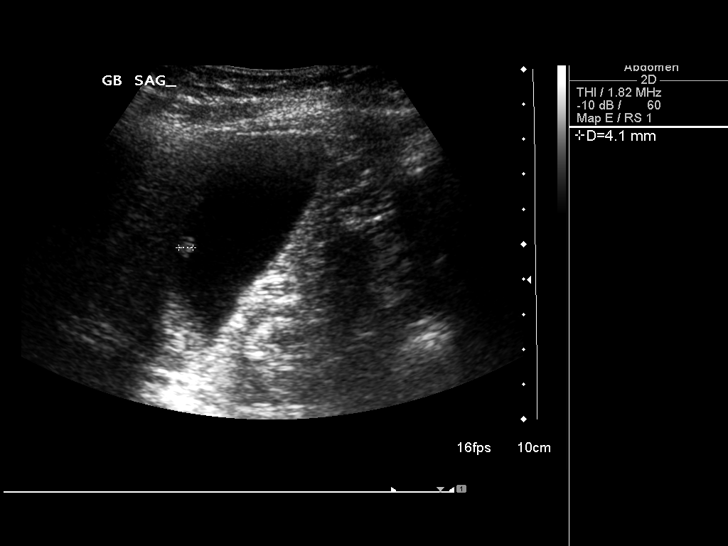
[im 29/46]
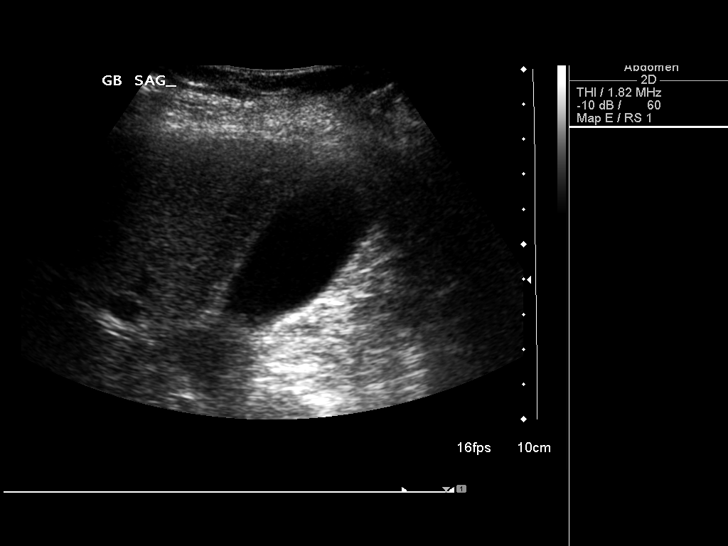
[im 31/46]
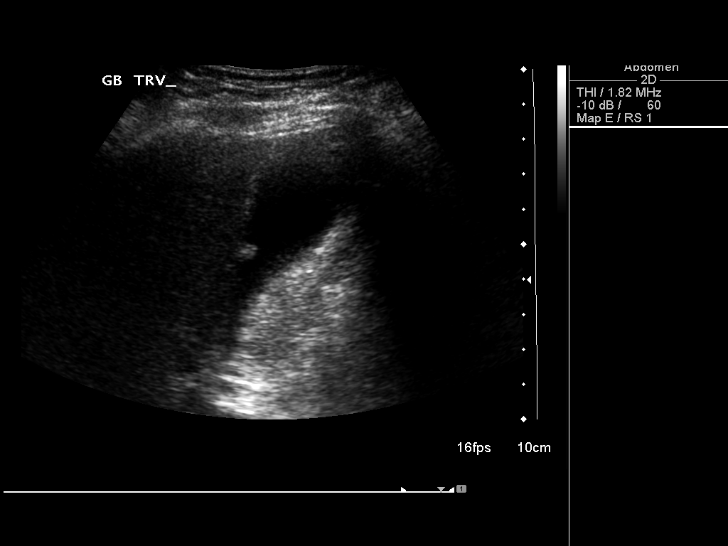
[im 34/46]
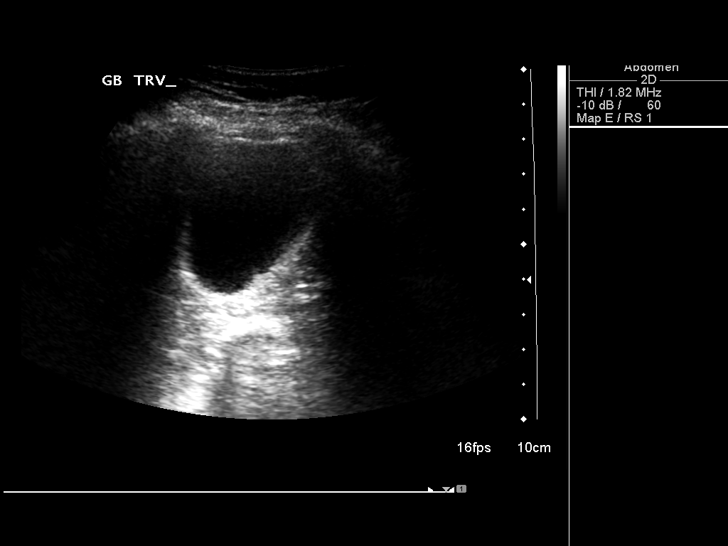
[im 38/46]
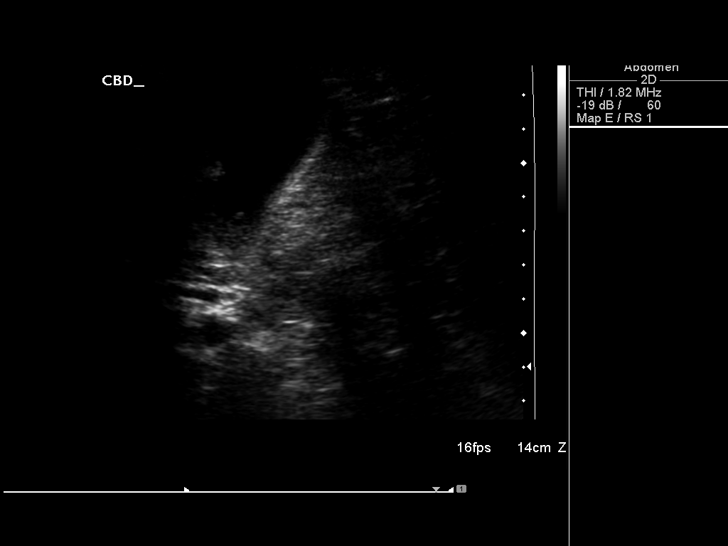
[im 42/46]
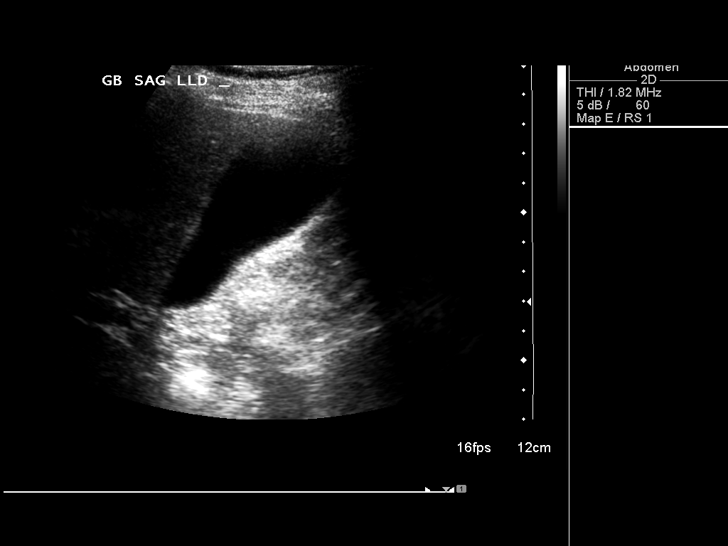
[im 46/46]
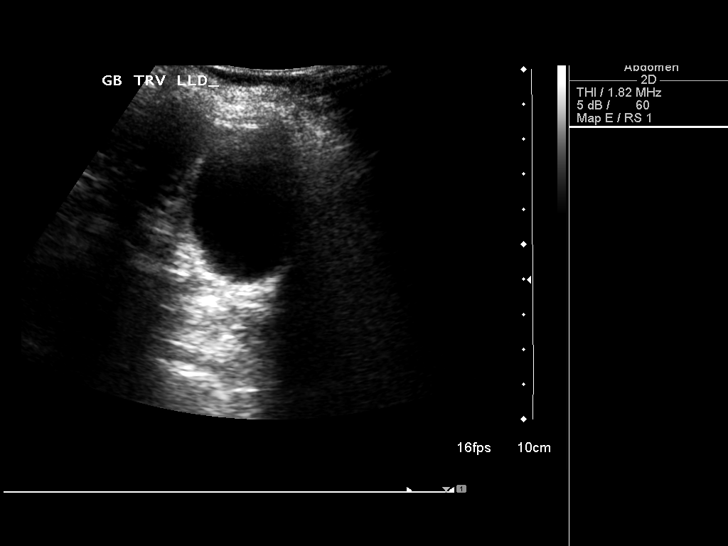

[14 of 25 positions shown; findings below may reference images not displayed]

FINDINGS: Gallbladder:

There is a 0.5 cm echogenic structure along the gallbladder wall.
This is most compatible with a gallbladder polyp. No evidence for
stones or wall thickening.

Common bile duct:

Diameter: 0.4 cm.

Liver:

Liver parenchyma is slightly heterogeneous. No focal abnormality. No
intrahepatic biliary dilatation.
IMPRESSION: Gallbladder polyp.  No biliary dilatation.

## 2017-04-01 DIAGNOSIS — M159 Polyosteoarthritis, unspecified: Secondary | ICD-10-CM | POA: Diagnosis not present

## 2017-04-01 DIAGNOSIS — Z6829 Body mass index (BMI) 29.0-29.9, adult: Secondary | ICD-10-CM | POA: Diagnosis not present

## 2017-04-01 DIAGNOSIS — M79671 Pain in right foot: Secondary | ICD-10-CM | POA: Diagnosis not present

## 2017-05-12 DIAGNOSIS — M79672 Pain in left foot: Secondary | ICD-10-CM | POA: Diagnosis not present

## 2017-05-12 DIAGNOSIS — M2550 Pain in unspecified joint: Secondary | ICD-10-CM | POA: Diagnosis not present

## 2017-05-12 DIAGNOSIS — Z6829 Body mass index (BMI) 29.0-29.9, adult: Secondary | ICD-10-CM | POA: Diagnosis not present

## 2017-05-12 DIAGNOSIS — E784 Other hyperlipidemia: Secondary | ICD-10-CM | POA: Diagnosis not present

## 2017-05-12 DIAGNOSIS — F3289 Other specified depressive episodes: Secondary | ICD-10-CM | POA: Diagnosis not present

## 2017-05-25 DIAGNOSIS — M79671 Pain in right foot: Secondary | ICD-10-CM | POA: Diagnosis not present

## 2017-05-25 DIAGNOSIS — M9689 Other intraoperative and postprocedural complications and disorders of the musculoskeletal system: Secondary | ICD-10-CM | POA: Diagnosis not present

## 2017-05-25 DIAGNOSIS — M960 Pseudarthrosis after fusion or arthrodesis: Secondary | ICD-10-CM | POA: Diagnosis not present

## 2017-08-04 DIAGNOSIS — Z6829 Body mass index (BMI) 29.0-29.9, adult: Secondary | ICD-10-CM | POA: Diagnosis not present

## 2017-08-04 DIAGNOSIS — B373 Candidiasis of vulva and vagina: Secondary | ICD-10-CM | POA: Diagnosis not present

## 2017-09-01 DIAGNOSIS — Z23 Encounter for immunization: Secondary | ICD-10-CM | POA: Diagnosis not present

## 2018-02-16 ENCOUNTER — Encounter: Payer: Self-pay | Admitting: Internal Medicine

## 2018-02-16 ENCOUNTER — Encounter (INDEPENDENT_AMBULATORY_CARE_PROVIDER_SITE_OTHER): Payer: Self-pay

## 2018-02-16 ENCOUNTER — Ambulatory Visit: Payer: Medicare Other | Admitting: Internal Medicine

## 2018-02-16 VITALS — BP 102/60 | HR 76 | Ht 63.0 in | Wt 172.8 lb

## 2018-02-16 DIAGNOSIS — R1011 Right upper quadrant pain: Secondary | ICD-10-CM

## 2018-02-16 DIAGNOSIS — D500 Iron deficiency anemia secondary to blood loss (chronic): Secondary | ICD-10-CM

## 2018-02-16 NOTE — Progress Notes (Signed)
Valerie Davis 77 y.o. March 14, 1941 [id number]  Assessment & Plan:   Encounter Diagnoses  Name Primary?  . Iron deficiency anemia due to chronic blood loss Yes  . RUQ pain      Needs reassessment w/ egd and colonoscopy If negative will consider capsule study small bowel   Since she was Fe defic in past ? If failed to completely replete but cannot rely on that as a cause esp w/ heme + stool. Another thought would be that she lost some blood with knee surgery  03/2015 Hgb was 11.9 and 04/2015 Hgb 8.9 so that confirms she lost blood with surgery - may not have supplemented enough or at all then? ? If fundoplication slipped and recurrent hiatal hernia - GI malignancy or other bleeding lesions are possible (AVM?)  The risks and benefits as well as alternatives of endoscopic procedure(s) have been discussed and reviewed. All questions answered. The patient agrees to proceed.  I appreciate the opportunity to care for this patient.  RF:FMBW, Valerie Saxon, MD   Subjective:   Chief Complaint: iron def anemia  HPI 77 yo ww here - had w/u for Fe defic anemia 2014 - large hiatal hernia w/ Cameron's erosions and dimunitive colon adenoma. Had hiatal hernia repaired. Anemia resolved.  Recently anemia and heme + at Dr. Raul Del [facility].  Also w/ some RUQ pain radiating into back electric shock up under ribs  Other hx "I was pale at Thanksgiving" had a silvery stool 1 x Fatigue  Hgb 10 B12 NL Heme + Was 12.4  No visible bleeding, chest pain or dysphagia or nausea and vomiting. Has started on ferrous sulfate - no recheck Hgb Labs from 2 weeks ago Kidney fx NL   Did have orthopedic surgery RLE in 2016 at [facility] after injury and Hgb decreased - see above ass/plan right-side medial unicompartmental arthroplasty        Allergies  Allergen Reactions  . Ace Inhibitors     REACTION: cough  . Amlodipine Besylate     REACTION: malaise   . Bupropion Hcl     REACTION: urticaria, angioedema  caused by wellbutrin  . Levofloxacin     REACTION: abdominal pain  . Pantoprazole Sodium     REACTION: rash   Current Meds  Medication Sig  . amphetamine-dextroamphetamine (ADDERALL) 20 MG tablet Take 20 mg by mouth 3 (three) times daily.  . calcium-vitamin D (OSCAL WITH D) 500-200 MG-UNIT per tablet Take 1 tablet by mouth daily with breakfast.  . Cholecalciferol (VITAMIN D) 2000 UNITS CAPS Take 1 capsule by mouth daily.  . cyanocobalamin (,VITAMIN B-12,) 1000 MCG/ML injection Inject 1,000 mcg into the muscle every 30 (thirty) days.   . ferrous sulfate 325 (65 FE) MG tablet Take 1 tablet (325 mg total) by mouth daily.  Marland Kitchen levothyroxine (SYNTHROID, LEVOTHROID) 88 MCG tablet Take 88 mcg by mouth daily before breakfast.   . losartan-hydrochlorothiazide (HYZAAR) 100-12.5 MG per tablet Take 1 tablet by mouth every morning.   . zolpidem (AMBIEN) 10 MG tablet Take 1 tablet (10 mg total) by mouth at bedtime as needed for sleep.   Past Medical History:  Diagnosis Date  . ADD (attention deficit disorder)   . Arthritis   . Asthma   . Blood transfusion without reported diagnosis   . Depression   . Diverticulosis   . GERD (gastroesophageal reflux disease)   . H/O: GI bleed    erosive gastritis (NSAID) 2007 in [place]  . Hypertension   . Hypothyroidism   .  Insomnia   . Iron deficiency anemia   . Osteopenia   . Personal history of colonic adenoma 07/06/2013  . Trigeminal neuralgia     hx of   Past Surgical History:  Procedure Laterality Date  . ABDOMINAL HYSTERECTOMY  age 66  . BILATERAL SALPINGOOPHORECTOMY    . COLONOSCOPY    . HIATAL HERNIA REPAIR N/A 08/30/2013   Procedure: LAPAROSCOPIC REPAIR OF HIATAL HERNIA;  Surgeon: Pedro Earls, MD;  Location: [facility];  Service: General;  Laterality: N/A;  With MESH  . knee menscectomy Right 07/2009  . UPPER GASTROINTESTINAL ENDOSCOPY    . 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   Review of Systems Some lingering effects of right LE injury and has not been as active and gained weight All other ROS neg  Objective:   Physical Exam @BP  102/60   Pulse 76   Ht 5\' 3"  (1.6 m)   Wt 172 lb 12.8 oz (78.4 kg)   BMI 30.61 kg/m @  General:  Well-developed, well-nourished and in no acute distress Eyes:  anicteric. ENT:   Mouth and posterior pharynx free of lesions.  Neck:   supple w/o thyromegaly or mass.  Lungs: Clear to auscultation bilaterally. Heart:  S1S2, no rubs, murmurs, gallops. Abdomen:  soft, non-tender, no hepatosplenomegaly, hernia, or mass and BS+.  Rectal: Deferred til colonoscopy Lymph:  no cervical or supraclavicular adenopathy. Extremities:   no edema, cyanosis or clubbing Skin   no rash. Neuro:  A&O x 3.  Psych:  appropriate mood and  Affect.   Data Reviewed: See HPI - Labs, prior op notes, procedure reports 2014

## 2018-02-16 NOTE — Patient Instructions (Signed)
You have been scheduled for a colonoscopy. Please follow written instructions given to you at your visit today.  Please pick up your prep supplies at the pharmacy. If you use inhalers (even only as needed), please bring them with you on the day of your procedure.   I appreciate the opportunity to care for you. Carl Gessner, MD, FACG 

## 2018-03-02 ENCOUNTER — Other Ambulatory Visit: Payer: Self-pay

## 2018-03-02 ENCOUNTER — Encounter: Payer: Self-pay | Admitting: Internal Medicine

## 2018-03-02 ENCOUNTER — Ambulatory Visit (AMBULATORY_SURGERY_CENTER): Payer: Medicare Other | Admitting: Internal Medicine

## 2018-03-02 VITALS — BP 128/77 | HR 64 | Temp 98.0°F | Resp 11 | Ht 63.0 in | Wt 172.0 lb

## 2018-03-02 DIAGNOSIS — D500 Iron deficiency anemia secondary to blood loss (chronic): Secondary | ICD-10-CM

## 2018-03-02 DIAGNOSIS — D123 Benign neoplasm of transverse colon: Secondary | ICD-10-CM

## 2018-03-02 MED ORDER — SODIUM CHLORIDE 0.9 % IV SOLN
500.0000 mL | Freq: Once | INTRAVENOUS | Status: DC
Start: 2018-03-02 — End: 2024-03-04

## 2018-03-02 NOTE — Progress Notes (Signed)
Called to room to assist during endoscopic procedure.  Patient ID and intended procedure confirmed with present staff. Received instructions for my participation in the procedure from the performing physician.  

## 2018-03-02 NOTE — Patient Instructions (Addendum)
The fundoplication (wrap) looks good. Esophagus, stomach and duodenum ok otherwise also.  The colonoscopy revealed a tiny polyp that I removed, diverticulosis and internal hemorrhoids.  I think your anemia and low iron may stem from blood loss related to the surgery you had and the hemorrhoids could have caused the + stool tests.  You could have bleeding lesions in the small intestine also.  Options are to treat with iron and see how you do vs going ahead with a study of the small intestine with capsule endoscopy.  I feel certain the polyp is benign and not a problem. Will let you know - will contact you after the polyp pathology returns.  I appreciate the opportunity to care for you. Gatha Mayer, MD, FACG YOU HAD AN ENDOSCOPIC PROCEDURE TODAY AT [facility]:   Refer to the procedure report that was given to you for any specific questions about what was found during the examination.  If the procedure report does not answer your questions, please call your gastroenterologist to clarify.  If you requested that your care partner not be given the details of your procedure findings, then the procedure report has been included in a sealed envelope for you to review at your convenience later.  YOU SHOULD EXPECT: Some feelings of bloating in the abdomen. Passage of more gas than usual.  Walking can help get rid of the air that was put into your GI tract during the procedure and reduce the bloating. If you had a lower endoscopy (such as a colonoscopy or flexible sigmoidoscopy) you may notice spotting of blood in your stool or on the toilet paper. If you underwent a bowel prep for your procedure, you may not have a normal bowel movement for a few days.  Please Note:  You might notice some irritation and congestion in your nose or some drainage.  This is from the oxygen used during your procedure.  There is no need for concern and it should clear up in a day or so.  SYMPTOMS TO  REPORT IMMEDIATELY:   Following lower endoscopy (colonoscopy or flexible sigmoidoscopy):  Excessive amounts of blood in the stool  Significant tenderness or worsening of abdominal pains  Swelling of the abdomen that is new, acute  Fever of 100F or higher   Following upper endoscopy (EGD)  Vomiting of blood or coffee ground material  New chest pain or pain under the shoulder blades  Painful or persistently difficult swallowing  New shortness of breath  Fever of 100F or higher  Black, tarry-looking stools  For urgent or emergent issues, a gastroenterologist can be reached at any hour by calling [phone].   DIET:  We do recommend a small meal at first, but then you may proceed to your regular diet.  Drink plenty of fluids but you should avoid alcoholic beverages for 24 hours.  MEDICATIONS: Continue present medications.  Please see handouts given to you by your recovery nurse.  ACTIVITY:  You should plan to take it easy for the rest of today and you should NOT DRIVE or use heavy machinery until tomorrow (because of the sedation medicines used during the test).    FOLLOW UP: Our staff will call the number listed on your records the next business day following your procedure to check on you and address any questions or concerns that you may have regarding the information given to you following your procedure. If we do not reach you, we will leave a message.  However, if you are feeling well and you are not experiencing any problems, there is no need to return our call.  We will assume that you have returned to your regular daily activities without incident.  If any biopsies were taken you will be contacted by phone or by letter within the next 1-3 weeks.  Please call us at [phone] if you have not heard about the biopsies in 3 weeks.   Thank you for allowing [place] to provide for your healthcare needs today.   SIGNATURES/CONFIDENTIALITY: You and/or your care partner have  signed paperwork which will be entered into your electronic medical record.  These signatures attest to the fact that that the information above on your After Visit Summary has been reviewed and is understood.  Full responsibility of the confidentiality of this discharge information lies with you and/or your care-partner.

## 2018-03-02 NOTE — Progress Notes (Signed)
I have reviewed the patient's medical history in detail and updated the computerized patient record.

## 2018-03-02 NOTE — Op Note (Signed)
[place] Patient Name: Valerie Davis Procedure Date: 03/02/2018 2:35 PM MRN: [id number] Endoscopist: Gatha Mayer , MD Age: 77 Referring MD:  Date of Birth: September 07, 1941 Gender: Female Account #: [id number] Procedure:                Upper GI endoscopy Indications:              Iron deficiency anemia secondary to chronic blood                            loss Medicines:                Propofol per Anesthesia, Monitored Anesthesia Care Procedure:                Pre-Anesthesia Assessment:                           - Prior to the procedure, a History and Physical                            was performed, and patient medications and                            allergies were reviewed. The patient's tolerance of                            previous anesthesia was also reviewed. The risks                            and benefits of the procedure and the sedation                            options and risks were discussed with the patient.                            All questions were answered, and informed consent                            was obtained. Prior Anticoagulants: The patient has                            taken no previous anticoagulant or antiplatelet                            agents. ASA Grade Assessment: II - A patient with                            mild systemic disease. After reviewing the risks                            and benefits, the patient was deemed in                            satisfactory condition to undergo the procedure.  After obtaining informed consent, the endoscope was                            passed under direct vision. Throughout the                            procedure, the patient's blood pressure, pulse, and                            oxygen saturations were monitored continuously. The                            Model GIF-[id number] scope was introduced                            through the mouth, and  advanced to the second part                            of duodenum. The upper GI endoscopy was                            accomplished without difficulty. The patient                            tolerated the procedure well. Scope In: Scope Out: Findings:                 Evidence of a Nissen fundoplication was found in                            the cardia. The wrap appeared intact.                           The exam was otherwise without abnormality.                           The cardia and gastric fundus were otherwise normal                            on retroflexion. Complications:            No immediate complications. Estimated Blood Loss:     Estimated blood loss: none. Impression:               - A Nissen fundoplication was found. The wrap                            appears intact. Small 1-2 cm residual hiatal hernia                            - not unusual.                           - The examination was otherwise normal.                           -  No specimens collected. Recommendation:           - Patient has a contact number available for                            emergencies. The signs and symptoms of potential                            delayed complications were discussed with the                            patient. Return to normal activities tomorrow.                            Written discharge instructions were provided to the                            patient.                           - Resume previous diet.                           - Continue present medications.                           - See the other procedure note (colonoscopy) for                            documentation of additional recommendations. Gatha Mayer, MD 03/02/2018 3:33:30 PM This report has been signed electronically.

## 2018-03-02 NOTE — Progress Notes (Signed)
To PACU, VSS. Report to RN.tb 

## 2018-03-02 NOTE — Op Note (Signed)
[place] Patient Name: Johnasia Liese Procedure Date: 03/02/2018 2:35 PM MRN: [id number] Endoscopist: Gatha Mayer , MD Age: 77 Referring MD:  Date of Birth: Jul 12, 1941 Gender: Female Account #: [id number] Procedure:                Colonoscopy Indications:              Iron deficiency anemia secondary to chronic blood                            loss Medicines:                Propofol per Anesthesia, Monitored Anesthesia Care Procedure:                Pre-Anesthesia Assessment:                           - Prior to the procedure, a History and Physical                            was performed, and patient medications and                            allergies were reviewed. The patient's tolerance of                            previous anesthesia was also reviewed. The risks                            and benefits of the procedure and the sedation                            options and risks were discussed with the patient.                            All questions were answered, and informed consent                            was obtained. Prior Anticoagulants: The patient has                            taken no previous anticoagulant or antiplatelet                            agents. ASA Grade Assessment: II - A patient with                            mild systemic disease. After reviewing the risks                            and benefits, the patient was deemed in                            satisfactory condition to undergo the procedure.  After obtaining informed consent, the colonoscope                            was passed under direct vision. Throughout the                            procedure, the patient's blood pressure, pulse, and                            oxygen saturations were monitored continuously. The                            Model [id number]-[id number]) scope was introduced                            through the anus and advanced to  the the cecum,                            identified by appendiceal orifice and ileocecal                            valve. The patient tolerated the procedure well.                            The quality of the bowel preparation was good. The                            ileocecal valve, appendiceal orifice, and rectum                            were photographed. The colonoscopy was somewhat                            difficult due to significant looping. Successful                            completion of the procedure was aided by applying                            abdominal pressure. The bowel preparation used was                            Miralax. Scope In: 3:04:57 PM Scope Out: 3:24:47 PM Scope Withdrawal Time: 0 hours 13 minutes 29 seconds  Total Procedure Duration: 0 hours 19 minutes 50 seconds  Findings:                 The perianal and digital rectal examinations were                            normal.                           A diminutive polyp was found in the transverse  colon. The polyp was sessile. The polyp was removed                            with a cold snare. Resection and retrieval were                            complete. Verification of patient identification                            for the specimen was done. Estimated blood loss was                            minimal.                           Multiple small and large-mouthed diverticula were                            found in the sigmoid colon. There was narrowing of                            the colon in association with the diverticular                            opening.                           Scattered diverticula were found in the right colon.                           Internal hemorrhoids were found during retroflexion.                           The exam was otherwise without abnormality on                            direct and retroflexion views. Complications:             No immediate complications. Estimated Blood Loss:     Estimated blood loss was minimal. Impression:               - One diminutive polyp in the transverse colon,                            removed with a cold snare. Resected and retrieved.                           - Severe diverticulosis in the sigmoid colon. There                            was narrowing of the colon in association with the                            diverticular opening.                           -  Diverticulosis in the right colon.                           - Internal hemorrhoids.                           - The examination was otherwise normal on direct                            and retroflexion views.                           - Personal history of colonic polyps. Recommendation:           - Patient has a contact number available for                            emergencies. The signs and symptoms of potential                            delayed complications were discussed with the                            patient. Return to normal activities tomorrow.                            Written discharge instructions were provided to the                            patient.                           - Resume previous diet.                           - Continue present medications.                           - Repeat colonoscopy is recommended. The                            colonoscopy date will be determined after pathology                            results from today's exam become available for                            review. Gatha Mayer, MD 03/02/2018 3:39:01 PM This report has been signed electronically.

## 2018-03-03 ENCOUNTER — Telehealth: Payer: Self-pay | Admitting: *Deleted

## 2018-03-03 NOTE — Telephone Encounter (Signed)
  Follow up Call-  Call back number 03/02/2018  Post procedure Call Back phone  # [phone]  Permission to leave phone message Yes  Some recent data might be hidden     Patient questions:  Do you have a fever, pain , or abdominal swelling? No. Pain Score  0 *  Have you tolerated food without any problems? Yes.    Have you been able to return to your normal activities? Yes.    Do you have any questions about your discharge instructions: Diet   No. Medications  No. Follow up visit  No.  Do you have questions or concerns about your Care? No.  Actions: * If pain score is 4 or above: No action needed, pain <4.

## 2018-03-03 NOTE — Telephone Encounter (Signed)
No answer. No identifier. Message left to call if questions or concerns. 

## 2018-03-07 ENCOUNTER — Encounter: Payer: Self-pay | Admitting: Internal Medicine

## 2018-03-07 NOTE — Progress Notes (Signed)
Diminutive adenoma No recall

## 2018-05-12 ENCOUNTER — Other Ambulatory Visit (HOSPITAL_COMMUNITY): Payer: Self-pay

## 2018-05-13 ENCOUNTER — Ambulatory Visit (HOSPITAL_COMMUNITY)
Admission: RE | Admit: 2018-05-13 | Discharge: 2018-05-13 | Disposition: A | Discharge status: Home / Self Care | Payer: Medicare Other | Source: Ambulatory Visit | Attending: Internal Medicine | Admitting: Internal Medicine

## 2018-05-13 DIAGNOSIS — D649 Anemia, unspecified: Secondary | ICD-10-CM | POA: Diagnosis not present

## 2018-05-13 MED ORDER — SODIUM CHLORIDE 0.9 % IV SOLN
510.0000 mg | INTRAVENOUS | Status: DC
  Administered 2018-05-13: 510 mg via INTRAVENOUS
  Filled 2018-05-13: qty 17

## 2018-05-19 ENCOUNTER — Ambulatory Visit: Payer: Medicare Other | Admitting: Internal Medicine

## 2018-05-19 ENCOUNTER — Encounter

## 2018-05-21 ENCOUNTER — Ambulatory Visit (HOSPITAL_COMMUNITY)
Admission: RE | Admit: 2018-05-21 | Discharge: 2018-05-21 | Disposition: A | Discharge status: Home / Self Care | Payer: Medicare Other | Source: Ambulatory Visit | Attending: Internal Medicine | Admitting: Internal Medicine

## 2018-05-21 DIAGNOSIS — D649 Anemia, unspecified: Secondary | ICD-10-CM | POA: Insufficient documentation

## 2018-05-21 MED ORDER — SODIUM CHLORIDE 0.9 % IV SOLN
510.0000 mg | INTRAVENOUS | Status: AC
  Administered 2018-05-21: 510 mg via INTRAVENOUS
  Filled 2018-05-21: qty 17

## 2018-10-21 ENCOUNTER — Encounter: Payer: Self-pay | Admitting: Internal Medicine

## 2018-10-28 ENCOUNTER — Other Ambulatory Visit: Payer: Self-pay | Admitting: Internal Medicine

## 2018-10-28 DIAGNOSIS — R1011 Right upper quadrant pain: Secondary | ICD-10-CM

## 2018-11-08 ENCOUNTER — Other Ambulatory Visit: Payer: Medicare Other

## 2018-11-12 ENCOUNTER — Ambulatory Visit
Admission: RE | Admit: 2018-11-12 | Discharge: 2018-11-12 | Disposition: A | Discharge status: Home / Self Care | Payer: Medicare Other | Source: Ambulatory Visit | Attending: Internal Medicine | Admitting: Internal Medicine

## 2018-11-12 DIAGNOSIS — R1011 Right upper quadrant pain: Secondary | ICD-10-CM

## 2019-01-28 DIAGNOSIS — K824 Cholesterolosis of gallbladder: Secondary | ICD-10-CM | POA: Diagnosis not present

## 2019-02-07 DIAGNOSIS — I10 Essential (primary) hypertension: Secondary | ICD-10-CM | POA: Diagnosis not present

## 2019-02-07 DIAGNOSIS — R7301 Impaired fasting glucose: Secondary | ICD-10-CM | POA: Diagnosis not present

## 2019-02-07 DIAGNOSIS — E538 Deficiency of other specified B group vitamins: Secondary | ICD-10-CM | POA: Diagnosis not present

## 2019-02-07 DIAGNOSIS — E038 Other specified hypothyroidism: Secondary | ICD-10-CM | POA: Diagnosis not present

## 2019-02-07 DIAGNOSIS — M8589 Other specified disorders of bone density and structure, multiple sites: Secondary | ICD-10-CM | POA: Diagnosis not present

## 2019-02-07 DIAGNOSIS — M859 Disorder of bone density and structure, unspecified: Secondary | ICD-10-CM | POA: Diagnosis not present

## 2019-02-07 DIAGNOSIS — E7849 Other hyperlipidemia: Secondary | ICD-10-CM | POA: Diagnosis not present

## 2019-02-14 DIAGNOSIS — R7301 Impaired fasting glucose: Secondary | ICD-10-CM | POA: Diagnosis not present

## 2019-02-14 DIAGNOSIS — Z0000 Encounter for general adult medical examination without abnormal findings: Secondary | ICD-10-CM | POA: Diagnosis not present

## 2019-02-14 DIAGNOSIS — Z1339 Encounter for screening examination for other mental health and behavioral disorders: Secondary | ICD-10-CM | POA: Diagnosis not present

## 2019-02-14 DIAGNOSIS — I10 Essential (primary) hypertension: Secondary | ICD-10-CM | POA: Diagnosis not present

## 2019-02-14 DIAGNOSIS — M79672 Pain in left foot: Secondary | ICD-10-CM | POA: Diagnosis not present

## 2019-02-14 DIAGNOSIS — E039 Hypothyroidism, unspecified: Secondary | ICD-10-CM | POA: Diagnosis not present

## 2019-02-14 DIAGNOSIS — M8580 Other specified disorders of bone density and structure, unspecified site: Secondary | ICD-10-CM | POA: Diagnosis not present

## 2019-02-14 DIAGNOSIS — N390 Urinary tract infection, site not specified: Secondary | ICD-10-CM | POA: Diagnosis not present

## 2019-02-14 DIAGNOSIS — D509 Iron deficiency anemia, unspecified: Secondary | ICD-10-CM | POA: Diagnosis not present

## 2019-02-14 DIAGNOSIS — K824 Cholesterolosis of gallbladder: Secondary | ICD-10-CM | POA: Diagnosis not present

## 2019-02-14 DIAGNOSIS — Z1331 Encounter for screening for depression: Secondary | ICD-10-CM | POA: Diagnosis not present

## 2019-02-14 DIAGNOSIS — E785 Hyperlipidemia, unspecified: Secondary | ICD-10-CM | POA: Diagnosis not present

## 2019-11-16 NOTE — Patient Instructions (Addendum)
DUE TO COVID-19 ONLY ONE VISITOR IS ALLOWED TO COME WITH YOU AND STAY IN THE WAITING ROOM ONLY DURING PRE OP AND PROCEDURE DAY OF SURGERY. THE 1 VISITOR MAY VISIT WITH YOU AFTER SURGERY IN YOUR PRIVATE ROOM DURING VISITING HOURS ONLY!  YOU NEED TO HAVE A COVID 19 TEST ON: 11/22/2019 @  3:15 pm   , THIS TEST MUST BE DONE BEFORE SURGERY, COME  [place], [address] [place] , [address].  ([place]) ONCE YOUR COVID TEST IS COMPLETED, PLEASE BEGIN THE QUARANTINE INSTRUCTIONS AS OUTLINED IN YOUR HANDOUT.                Autumn Patty    Your procedure is scheduled on: 11/25/2019   Report to [facility] Main  Entrance   Report to short stay at: 5:30 AM     Call this number if you have problems the morning of surgery [phone]    Remember: Do not eat food or drink liquids :After Midnight.    BRUSH YOUR TEETH MORNING OF SURGERY AND RINSE YOUR MOUTH OUT, NO CHEWING GUM CANDY OR MINTS.     Take these medicines the morning of surgery with A SIP OF WATER: Adderal,Duloxatine,Levothyroxine,Omeprazole.                                 You may not have any metal on your body including hair pins and              piercings  Do not wear jewelry, make-up, lotions, powders or perfumes, deodorant             Do not wear nail polish on your fingernails.  Do not shave  48 hours prior to surgery.            .   Do not bring valuables to the hospital. [place].  Contacts, dentures or bridgework may not be worn into surgery.  Leave suitcase in the car. After surgery it may be brought to your room.     Patients discharged the day of surgery will not be allowed to drive home. IF YOU ARE HAVING SURGERY AND GOING HOME THE SAME DAY, YOU MUST HAVE AN ADULT TO DRIVE YOU HOME AND BE WITH YOU FOR 24 HOURS. YOU MAY GO HOME BY TAXI OR UBER OR ORTHERWISE, BUT AN ADULT MUST ACCOMPANY YOU HOME AND STAY WITH YOU FOR 24 HOURS.  Name and phone number  of your driver:  Special Instructions: N/A              Please read over the following fact sheets you were given: _____________________________________________________________________             [facility] - Preparing for Surgery Before surgery, you can play an important role.  Because skin is not sterile, your skin needs to be as free of germs as possible.  You can reduce the number of germs on your skin by washing with CHG (chlorahexidine gluconate) soap before surgery.  CHG is an antiseptic cleaner which kills germs and bonds with the skin to continue killing germs even after washing. Please DO NOT use if you have an allergy to CHG or antibacterial soaps.  If your skin becomes reddened/irritated stop using the CHG and inform your nurse when you arrive at  Short Stay. Do not shave (including legs and underarms) for at least 48 hours prior to the first CHG shower.  You may shave your face/neck. Please follow these instructions carefully:  1.  Shower with CHG Soap the night before surgery and the  morning of Surgery.  2.  If you choose to wash your hair, wash your hair first as usual with your  normal  shampoo.  3.  After you shampoo, rinse your hair and body thoroughly to remove the  shampoo.                           4.  Use CHG as you would any other liquid soap.  You can apply chg directly  to the skin and wash                       Gently with a scrungie or clean washcloth.  5.  Apply the CHG Soap to your body ONLY FROM THE NECK DOWN.   Do not use on face/ open                           Wound or open sores. Avoid contact with eyes, ears mouth and genitals (private parts).                       Wash face,  Genitals (private parts) with your normal soap.             6.  Wash thoroughly, paying special attention to the area where your surgery  will be performed.  7.  Thoroughly rinse your body with warm water from the neck down.  8.  DO NOT shower/wash with your normal soap after using and  rinsing off  the CHG Soap.                9.  Pat yourself dry with a clean towel.            10.  Wear clean pajamas.            11.  Place clean sheets on your bed the night of your first shower and do not  sleep with pets. Day of Surgery : Do not apply any lotions/deodorants the morning of surgery.  Please wear clean clothes to the hospital/surgery center.  FAILURE TO FOLLOW THESE INSTRUCTIONS MAY RESULT IN THE CANCELLATION OF YOUR SURGERY PATIENT SIGNATURE_________________________________  NURSE SIGNATURE__________________________________  ________________________________________________________________________

## 2019-11-16 NOTE — Progress Notes (Signed)
Can you please place some orders for this pt.Her PAT appoinment is for tomorrow(11/17/2019). Thanks

## 2019-11-17 ENCOUNTER — Encounter (HOSPITAL_COMMUNITY)
Admission: RE | Admit: 2019-11-17 | Discharge: 2019-11-17 | Disposition: A | Discharge status: Home / Self Care | Payer: Medicare Other | Source: Ambulatory Visit | Attending: Orthopedic Surgery | Admitting: Orthopedic Surgery

## 2019-11-17 ENCOUNTER — Encounter (HOSPITAL_COMMUNITY): Payer: Self-pay

## 2019-11-17 ENCOUNTER — Other Ambulatory Visit: Payer: Self-pay

## 2019-11-17 DIAGNOSIS — M1712 Unilateral primary osteoarthritis, left knee: Secondary | ICD-10-CM | POA: Insufficient documentation

## 2019-11-17 DIAGNOSIS — Z01812 Encounter for preprocedural laboratory examination: Secondary | ICD-10-CM | POA: Diagnosis not present

## 2019-11-17 HISTORY — DX: Pneumonia, unspecified organism: J18.9

## 2019-11-17 LAB — CBC
HCT: 38.9 % (ref 36.0–46.0)
Hemoglobin: 12.4 g/dL (ref 12.0–15.0)
MCH: 28.6 pg (ref 26.0–34.0)
MCHC: 31.9 g/dL (ref 30.0–36.0)
MCV: 89.6 fL (ref 80.0–100.0)
Platelets: 354 10*3/uL (ref 150–400)
RBC: 4.34 MIL/uL (ref 3.87–5.11)
RDW: 13.6 % (ref 11.5–15.5)
WBC: 7.1 10*3/uL (ref 4.0–10.5)
nRBC: 0 % (ref 0.0–0.2)

## 2019-11-17 LAB — SURGICAL PCR SCREEN
MRSA, PCR: POSITIVE — AB
Staphylococcus aureus: POSITIVE — AB

## 2019-11-17 LAB — BASIC METABOLIC PANEL
Anion gap: 12 (ref 5–15)
BUN: 22 mg/dL (ref 8–23)
CO2: 28 mmol/L (ref 22–32)
Calcium: 9.5 mg/dL (ref 8.9–10.3)
Chloride: 98 mmol/L (ref 98–111)
Creatinine, Ser: 1.27 mg/dL — ABNORMAL HIGH (ref 0.44–1.00)
GFR calc Af Amer: 47 mL/min — ABNORMAL LOW (ref >60)
GFR calc non Af Amer: 41 mL/min — ABNORMAL LOW (ref >60)
Glucose, Bld: 117 mg/dL — ABNORMAL HIGH (ref 70–99)
Potassium: 3.3 mmol/L — ABNORMAL LOW (ref 3.5–5.1)
Sodium: 138 mmol/L (ref 135–145)

## 2019-11-17 NOTE — Progress Notes (Signed)
PCP - Marton Redwood Cardiologist -   Chest x-ray -  EKG -  Stress Test -  ECHO -  Cardiac Cath -   Sleep Study -  CPAP -   Fasting Blood Sugar -  Checks Blood Sugar _____ times a day  Blood Thinner Instructions: Aspirin Instructions: Last Dose:  Anesthesia review:   Patient denies shortness of breath, fever, cough and chest pain at PAT appointment   Patient verbalized understanding of instructions that were given to them at the PAT appointment. Patient was also instructed that they will need to review over the PAT instructions again at home before surgery.

## 2019-11-21 ENCOUNTER — Ambulatory Visit: Payer: Self-pay | Admitting: Physician Assistant

## 2019-11-21 MED ORDER — VANCOMYCIN HCL 10 G IV SOLR: 1000.0000 mg | INTRAVENOUS | Status: AC

## 2019-11-21 NOTE — H&P (View-Only) (Signed)
TOTAL KNEE ADMISSION H&P  Patient is being admitted for left total knee arthroplasty.  Subjective:  Chief Complaint:left knee pain.  HPI: Valerie Davis, 79 y.o. female, has a history of pain and functional disability in the left knee due to arthritis and has failed non-surgical conservative treatments for greater than 12 weeks to includeNSAID's and/or analgesics, corticosteriod injections and activity modification.  Onset of symptoms was gradual, starting 7 years ago with gradually worsening course since that time. The patient noted no past surgery on the left knee(s).  Patient currently rates pain in the left knee(s) at 8 out of 10 with activity. Patient has night pain, worsening of pain with activity and weight bearing, pain that interferes with activities of daily living, pain with passive range of motion, crepitus and joint swelling.  Patient has evidence of periarticular osteophytes and joint space narrowing by imaging studies.  There is no active infection.  Patient Active Problem List   Diagnosis Date Noted  . 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 Chronic upper GI bleeding 08/19/2013  . Cameron ulcer 08/19/2013  . Orthostasis 08/19/2013  . Near syncope 08/19/2013  . Personal history of colonic adenoma 07/06/2013  . INSOMNIA-SLEEP DISORDER-UNSPEC 09/05/2010  . NEURALGIA, TRIGEMINAL 06/06/2010  . HYPOTHYROIDISM 04/26/2010  . ANEMIA-IRON DEFICIENCY 04/26/2010  . HYPERTENSION 04/26/2010  . ANEMIA, B12 DEFICIENCY 12/17/2009   Past Medical History:  Diagnosis Date  . ADD (attention deficit disorder)   . Arthritis   . Asthma   . Blood transfusion without reported diagnosis   . Depression   . Diverticulosis   . GERD (gastroesophageal reflux disease)   . H/O: GI bleed    erosive gastritis (NSAID) 2007 in [place]  . Hypertension   . Hypothyroidism   . Insomnia   . Iron deficiency anemia   . Osteopenia   .  Personal history of colonic adenoma 07/06/2013  . Pneumonia   . Trigeminal neuralgia     hx of    Past Surgical History:  Procedure Laterality Date  . ABDOMINAL HYSTERECTOMY  age 68  . BILATERAL SALPINGOOPHORECTOMY    . COLONOSCOPY    . HIATAL HERNIA REPAIR N/A 08/30/2013   Procedure: LAPAROSCOPIC REPAIR OF HIATAL HERNIA;  Surgeon: Pedro Earls, MD;  Location: [facility] ORS;  Service: General;  Laterality: N/A;  With MESH  . knee menscectomy Right 07/2009  . UPPER GASTROINTESTINAL ENDOSCOPY    . VAGINAL HYSTERECTOMY      Current Outpatient Medications  Medication Sig Dispense Refill Last Dose  . ALPRAZolam (XANAX) 0.5 MG tablet Take 0.5 mg by mouth at bedtime as needed for sleep.     Marland Kitchen amphetamine-dextroamphetamine (ADDERALL) 20 MG tablet Take 20 mg by mouth See admin instructions. Take 20 mg in the morning, may take second 20 mg dose as needed ADD symptoms     . Ascorbic Acid (VITAMIN C) 1000 MG tablet Take 1,000 mg by mouth 2 (two) times daily.     . cholecalciferol (VITAMIN D3) 25 MCG (1000 UT) tablet Take 1,000 Units by mouth at bedtime.     . cyanocobalamin (,VITAMIN B-12,) 1000 MCG/ML injection Inject 1,000 mcg into the muscle every 30 (thirty) days.  1 mL 0   . DULoxetine (CYMBALTA) 60 MG capsule Take 60 mg by mouth daily.     Marland Kitchen levothyroxine (SYNTHROID) 100 MCG tablet Take 100 mcg by mouth daily before breakfast.     . omeprazole (PRILOSEC)  40 MG capsule Take 40 mg by mouth at bedtime.     Marland Kitchen POLY-IRON 150 150 MG capsule Take 150 mg by mouth at bedtime.     . rosuvastatin (CRESTOR) 10 MG tablet Take 10 mg by mouth at bedtime.     . valsartan-hydrochlorothiazide (DIOVAN-HCT) 320-25 MG tablet Take 1 tablet by mouth daily.      Current Facility-Administered Medications  Medication Dose Route Frequency Provider Last Rate Last Admin  . 0.9 %  sodium chloride infusion  500 mL Intravenous Once Gatha Mayer, MD       Allergies  Allergen Reactions  . Ace Inhibitors Cough  .  Amlodipine Besylate     malaise   . Bupropion Hcl     urticaria, angioedema caused by wellbutrin  . Levofloxacin     abdominal pain  . Pantoprazole Sodium Rash    Social History   Tobacco Use  . Smoking status: Former Smoker    Quit date: 11/17/1997    Years since quitting: 22.0  . Smokeless tobacco: Never Used  Substance Use Topics  . Alcohol use: Yes    Comment: occasional    Family History  Problem Relation Age of Onset  . Heart attack Father 3  . Stroke Mother   . Cancer Sister        sarcoma  . Stomach cancer Maternal Grandfather   . Colon cancer Neg Hx   . Rectal cancer Neg Hx      Review of Systems  Musculoskeletal: Positive for arthralgias and joint swelling.  Psychiatric/Behavioral: The patient is nervous/anxious.   All other systems reviewed and are negative.   Objective:  Physical Exam  Constitutional: She is oriented to person, place, and time. She appears well-developed and well-nourished. No distress.  HENT:  Head: Normocephalic and atraumatic.  Eyes: Pupils are equal, round, and reactive to light. Conjunctivae and EOM are normal.  Cardiovascular: Normal rate, regular rhythm, normal heart sounds and intact distal pulses.  Respiratory: Effort normal and breath sounds normal. No respiratory distress.  GI: Soft. Bowel sounds are normal. She exhibits no distension. There is no abdominal tenderness.  Musculoskeletal:     Cervical back: Normal range of motion and neck supple.     Left knee: Swelling and bony tenderness present. Tenderness present.  Neurological: She is alert and oriented to person, place, and time.  Skin: Skin is warm and dry. No rash noted. No erythema.  Psychiatric: She has a normal mood and affect. Her behavior is normal.    Vital signs in last 24 hours: @VSRANGES @  Labs:   Estimated body mass index is 29.58 kg/m as calculated from the following:   Height as of 11/17/19: 5\' 3"  (1.6 m).   Weight as of 11/17/19: 75.8  kg.   Imaging Review Plain radiographs demonstrate moderate degenerative joint disease of the left knee(s). The overall alignment ismild varus. The bone quality appears to be good for age and reported activity level.      Assessment/Plan:  End stage arthritis, left knee   The patient history, physical examination, clinical judgment of the provider and imaging studies are consistent with end stage degenerative joint disease of the left knee(s) and total knee arthroplasty is deemed medically necessary. The treatment options including medical management, injection therapy arthroscopy and arthroplasty were discussed at length. The risks and benefits of total knee arthroplasty were presented and reviewed. The risks due to aseptic loosening, infection, stiffness, patella tracking problems, thromboembolic complications and other imponderables were  discussed. The patient acknowledged the explanation, agreed to proceed with the plan and consent was signed. Patient is being admitted for inpatient treatment for surgery, pain control, PT, OT, prophylactic antibiotics, VTE prophylaxis, progressive ambulation and ADL's and discharge planning. The patient is planning to be discharged home with home health services    Anticipated LOS equal to or greater than 2 midnights due to - Age 19 and older with one or more of the following:  - Obesity  - Expected need for hospital services (PT, OT, Nursing) required for safe  discharge  - Anticipated need for postoperative skilled nursing care or inpatient rehab  - Active co-morbidities: Anemia OR   - Unanticipated findings during/Post Surgery: None  - Patient is a high risk of re-admission due to: None

## 2019-11-21 NOTE — H&P (Signed)
TOTAL KNEE ADMISSION H&P  Patient is being admitted for left total knee arthroplasty.  Subjective:  Chief Complaint:left knee pain.  HPI: Valerie Davis, 79 y.o. female, has a history of pain and functional disability in the left knee due to arthritis and has failed non-surgical conservative treatments for greater than 12 weeks to includeNSAID's and/or analgesics, corticosteriod injections and activity modification.  Onset of symptoms was gradual, starting 7 years ago with gradually worsening course since that time. The patient noted no past surgery on the left knee(s).  Patient currently rates pain in the left knee(s) at 8 out of 10 with activity. Patient has night pain, worsening of pain with activity and weight bearing, pain that interferes with activities of daily living, pain with passive range of motion, crepitus and joint swelling.  Patient has evidence of periarticular osteophytes and joint space narrowing by imaging studies.  There is no active infection.  Patient Active Problem List   Diagnosis Date Noted  . 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 Chronic upper GI bleeding 08/19/2013  . Cameron ulcer 08/19/2013  . Orthostasis 08/19/2013  . Near syncope 08/19/2013  . Personal history of colonic adenoma 07/06/2013  . INSOMNIA-SLEEP DISORDER-UNSPEC 09/05/2010  . NEURALGIA, TRIGEMINAL 06/06/2010  . HYPOTHYROIDISM 04/26/2010  . ANEMIA-IRON DEFICIENCY 04/26/2010  . HYPERTENSION 04/26/2010  . ANEMIA, B12 DEFICIENCY 12/17/2009   Past Medical History:  Diagnosis Date  . ADD (attention deficit disorder)   . Arthritis   . Asthma   . Blood transfusion without reported diagnosis   . Depression   . Diverticulosis   . GERD (gastroesophageal reflux disease)   . H/O: GI bleed    erosive gastritis (NSAID) 2007 in [place]  . Hypertension   . Hypothyroidism   . Insomnia   . Iron deficiency anemia   . Osteopenia   .  Personal history of colonic adenoma 07/06/2013  . Pneumonia   . Trigeminal neuralgia     hx of    Past Surgical History:  Procedure Laterality Date  . ABDOMINAL HYSTERECTOMY  age 83  . BILATERAL SALPINGOOPHORECTOMY    . COLONOSCOPY    . HIATAL HERNIA REPAIR N/A 08/30/2013   Procedure: LAPAROSCOPIC REPAIR OF HIATAL HERNIA;  Surgeon: Pedro Earls, MD;  Location: [facility];  Service: General;  Laterality: N/A;  With MESH  . knee menscectomy Right 07/2009  . UPPER GASTROINTESTINAL ENDOSCOPY    . VAGINAL HYSTERECTOMY      Current Outpatient Medications  Medication Sig Dispense Refill Last Dose  . ALPRAZolam (XANAX) 0.5 MG tablet Take 0.5 mg by mouth at bedtime as needed for sleep.     Marland Kitchen amphetamine-dextroamphetamine (ADDERALL) 20 MG tablet Take 20 mg by mouth See admin instructions. Take 20 mg in the morning, may take second 20 mg dose as needed ADD symptoms     . Ascorbic Acid (VITAMIN C) 1000 MG tablet Take 1,000 mg by mouth 2 (two) times daily.     . cholecalciferol (VITAMIN D3) 25 MCG (1000 UT) tablet Take 1,000 Units by mouth at bedtime.     . cyanocobalamin (,VITAMIN B-12,) 1000 MCG/ML injection Inject 1,000 mcg into the muscle every 30 (thirty) days.  1 mL 0   . DULoxetine (CYMBALTA) 60 MG capsule Take 60 mg by mouth daily.     Marland Kitchen levothyroxine (SYNTHROID) 100 MCG tablet Take 100 mcg by mouth daily before breakfast.     . omeprazole (PRILOSEC)  40 MG capsule Take 40 mg by mouth at bedtime.     Marland Kitchen POLY-IRON 150 150 MG capsule Take 150 mg by mouth at bedtime.     . rosuvastatin (CRESTOR) 10 MG tablet Take 10 mg by mouth at bedtime.     . valsartan-hydrochlorothiazide (DIOVAN-HCT) 320-25 MG tablet Take 1 tablet by mouth daily.      Current Facility-Administered Medications  Medication Dose Route Frequency Provider Last Rate Last Admin  . 0.9 %  sodium chloride infusion  500 mL Intravenous Once Gatha Mayer, MD       Allergies  Allergen Reactions  . Ace Inhibitors Cough  .  Amlodipine Besylate     malaise   . Bupropion Hcl     urticaria, angioedema caused by wellbutrin  . Levofloxacin     abdominal pain  . Pantoprazole Sodium Rash    Social History   Tobacco Use  . Smoking status: Former Smoker    Quit date: 11/17/1997    Years since quitting: 22.0  . Smokeless tobacco: Never Used  Substance Use Topics  . Alcohol use: Yes    Comment: occasional    Family History  Problem Relation Age of Onset  . Heart attack Father 55  . Stroke Mother   . Cancer Sister        sarcoma  . Stomach cancer Maternal Grandfather   . Colon cancer Neg Hx   . Rectal cancer Neg Hx      Review of Systems  Musculoskeletal: Positive for arthralgias and joint swelling.  Psychiatric/Behavioral: The patient is nervous/anxious.   All other systems reviewed and are negative.   Objective:  Physical Exam  Constitutional: She is oriented to person, place, and time. She appears well-developed and well-nourished. No distress.  HENT:  Head: Normocephalic and atraumatic.  Eyes: Pupils are equal, round, and reactive to light. Conjunctivae and EOM are normal.  Cardiovascular: Normal rate, regular rhythm, normal heart sounds and intact distal pulses.  Respiratory: Effort normal and breath sounds normal. No respiratory distress.  GI: Soft. Bowel sounds are normal. She exhibits no distension. There is no abdominal tenderness.  Musculoskeletal:     Cervical back: Normal range of motion and neck supple.     Left knee: Swelling and bony tenderness present. Tenderness present.  Neurological: She is alert and oriented to person, place, and time.  Skin: Skin is warm and dry. No rash noted. No erythema.  Psychiatric: She has a normal mood and affect. Her behavior is normal.    Vital signs in last 24 hours: @VSRANGES @  Labs:   Estimated body mass index is 29.58 kg/m as calculated from the following:   Height as of 11/17/19: 5\' 3"  (1.6 m).   Weight as of 11/17/19: 75.8  kg.   Imaging Review Plain radiographs demonstrate moderate degenerative joint disease of the left knee(s). The overall alignment ismild varus. The bone quality appears to be good for age and reported activity level.      Assessment/Plan:  End stage arthritis, left knee   The patient history, physical examination, clinical judgment of the provider and imaging studies are consistent with end stage degenerative joint disease of the left knee(s) and total knee arthroplasty is deemed medically necessary. The treatment options including medical management, injection therapy arthroscopy and arthroplasty were discussed at length. The risks and benefits of total knee arthroplasty were presented and reviewed. The risks due to aseptic loosening, infection, stiffness, patella tracking problems, thromboembolic complications and other imponderables were  discussed. The patient acknowledged the explanation, agreed to proceed with the plan and consent was signed. Patient is being admitted for inpatient treatment for surgery, pain control, PT, OT, prophylactic antibiotics, VTE prophylaxis, progressive ambulation and ADL's and discharge planning. The patient is planning to be discharged home with home health services    Anticipated LOS equal to or greater than 2 midnights due to - Age 67 and older with one or more of the following:  - Obesity  - Expected need for hospital services (PT, OT, Nursing) required for safe  discharge  - Anticipated need for postoperative skilled nursing care or inpatient rehab  - Active co-morbidities: Anemia OR   - Unanticipated findings during/Post Surgery: None  - Patient is a high risk of re-admission due to: None

## 2019-11-21 NOTE — Care Plan (Signed)
Ortho Bundle Case Management Note  Patient Details  Name: Valerie Davis MRN: [id number] Date of Birth: Dec 20, 1940  Spoke with patient and daughter prior to surgery. They are planning on same day discharge, pending no post op issues and passes PT. Patient is very anxious about surgery. Daugher, [name] [phone], will be living with her for assistance. HHPT referral to [facility] for services until she start OPPT at [facility]. Referral has been sent facility will contact patient with appointment time.  Patient, MD and Daughter in agreement with plan. Choice offered.                  DME Arranged:  CPM DME Agency:     HH Arranged:  PT HH Agency:  [facility] ([facility]  Additional Comments: Please contact me with any questions of if this plan should need to change.  Ladell Heads,  [place] [facility]  [phone] 11/21/2019, 11:52 AM

## 2019-11-22 ENCOUNTER — Other Ambulatory Visit (HOSPITAL_COMMUNITY)
Admission: RE | Admit: 2019-11-22 | Discharge: 2019-11-22 | Disposition: A | Discharge status: Home / Self Care | Payer: Medicare Other | Source: Ambulatory Visit | Attending: Orthopedic Surgery | Admitting: Orthopedic Surgery

## 2019-11-22 DIAGNOSIS — Z20822 Contact with and (suspected) exposure to covid-19: Secondary | ICD-10-CM | POA: Diagnosis not present

## 2019-11-22 DIAGNOSIS — Z01812 Encounter for preprocedural laboratory examination: Secondary | ICD-10-CM | POA: Insufficient documentation

## 2019-11-23 LAB — NOVEL CORONAVIRUS, NAA (HOSP ORDER, SEND-OUT TO REF LAB; TAT 18-24 HRS): SARS-CoV-2, NAA: NOT DETECTED

## 2019-11-24 MED ORDER — TRANEXAMIC ACID 1000 MG/10ML IV SOLN
2000.0000 mg | INTRAVENOUS | Status: DC
  Filled 2019-11-24: qty 20

## 2019-11-24 MED ORDER — BUPIVACAINE LIPOSOME 1.3 % IJ SUSP
20.0000 mL | Freq: Once | INTRAMUSCULAR | Status: DC
  Filled 2019-11-24: qty 20

## 2019-11-24 NOTE — Anesthesia Preprocedure Evaluation (Addendum)
Anesthesia Evaluation  Patient identified by MRN, date of birth, ID band Patient awake    Reviewed: Allergy & Precautions, H&P , NPO status , Patient's Chart, lab work & pertinent test results  Airway Mallampati: II  TM Distance: >3 FB Neck ROM: full    Dental no notable dental hx. (+) Teeth Intact, Dental Advisory Given, Chipped,  Small mouth opening, pt states upper and lower front teeth were chipped with previous hernia repair done here in 2014. Record shows 2 attempts at intubation, successful with video laryngoscope :   Pulmonary former smoker,  Former smoker, quit 1999   Pulmonary exam normal breath sounds clear to auscultation       Cardiovascular Exercise Tolerance: Good hypertension, Pt. on medications negative cardio ROS Normal cardiovascular exam Rhythm:regular Rate:Normal     Neuro/Psych PSYCHIATRIC DISORDERS Depression Trigeminal neuralgia negative neurological ROS     GI/Hepatic Neg liver ROS, PUD, GERD  Medicated and Controlled,Chronic upper GI bleeding with anemia   Endo/Other  Hypothyroidism   Renal/GU Renal InsufficiencyRenal diseaseCr 1.27  negative genitourinary   Musculoskeletal  (+) Arthritis , Osteoarthritis,  OA L knee   Abdominal Normal abdominal exam  (+)   Peds negative pediatric ROS (+)  Hematology  (+) anemia , hgb 12.4, plt 354   Anesthesia Other Findings HLD  Reproductive/Obstetrics negative OB ROS                            Anesthesia Physical  Anesthesia Plan  ASA: III  Anesthesia Plan: Regional and Spinal   Post-op Pain Management:  Regional for Post-op pain   Induction: Intravenous  PONV Risk Score and Plan: 2 and Propofol infusion and TIVA  Airway Management Planned: Natural Airway and Simple Face Mask  Additional Equipment: None  Intra-op Plan:   Post-operative Plan:   Informed Consent: I have reviewed the patients History and  Physical, chart, labs and discussed the procedure including the risks, benefits and alternatives for the proposed anesthesia with the patient or authorized representative who has indicated his/her understanding and acceptance.       Plan Discussed with: CRNA  Anesthesia Plan Comments:         Anesthesia Quick Evaluation

## 2019-11-25 ENCOUNTER — Ambulatory Visit (HOSPITAL_COMMUNITY)
Admission: RE | Admit: 2019-11-25 | Discharge: 2019-11-26 | Disposition: A | Discharge status: Home / Self Care | Payer: Medicare Other | Attending: Orthopedic Surgery | Admitting: Orthopedic Surgery

## 2019-11-25 ENCOUNTER — Encounter (HOSPITAL_COMMUNITY): Payer: Self-pay | Admitting: Orthopedic Surgery

## 2019-11-25 ENCOUNTER — Ambulatory Visit (HOSPITAL_COMMUNITY): Payer: Medicare Other | Admitting: Certified Registered"

## 2019-11-25 ENCOUNTER — Ambulatory Visit (HOSPITAL_COMMUNITY): Payer: Medicare Other | Admitting: Physician Assistant

## 2019-11-25 ENCOUNTER — Other Ambulatory Visit: Payer: Self-pay

## 2019-11-25 ENCOUNTER — Encounter (HOSPITAL_COMMUNITY)
Admission: RE | Disposition: A | Discharge status: Home / Self Care | Payer: Self-pay | Source: Home / Self Care | Attending: Orthopedic Surgery

## 2019-11-25 DIAGNOSIS — Z90710 Acquired absence of both cervix and uterus: Secondary | ICD-10-CM | POA: Diagnosis not present

## 2019-11-25 DIAGNOSIS — Z823 Family history of stroke: Secondary | ICD-10-CM | POA: Insufficient documentation

## 2019-11-25 DIAGNOSIS — Z87891 Personal history of nicotine dependence: Secondary | ICD-10-CM | POA: Insufficient documentation

## 2019-11-25 DIAGNOSIS — F329 Major depressive disorder, single episode, unspecified: Secondary | ICD-10-CM | POA: Diagnosis not present

## 2019-11-25 DIAGNOSIS — Z79899 Other long term (current) drug therapy: Secondary | ICD-10-CM | POA: Insufficient documentation

## 2019-11-25 DIAGNOSIS — D509 Iron deficiency anemia, unspecified: Secondary | ICD-10-CM | POA: Diagnosis not present

## 2019-11-25 DIAGNOSIS — Z86010 Personal history of colonic polyps: Secondary | ICD-10-CM | POA: Diagnosis not present

## 2019-11-25 DIAGNOSIS — F988 Other specified behavioral and emotional disorders with onset usually occurring in childhood and adolescence: Secondary | ICD-10-CM | POA: Insufficient documentation

## 2019-11-25 DIAGNOSIS — Z888 Allergy status to other drugs, medicaments and biological substances status: Secondary | ICD-10-CM | POA: Insufficient documentation

## 2019-11-25 DIAGNOSIS — Z8249 Family history of ischemic heart disease and other diseases of the circulatory system: Secondary | ICD-10-CM | POA: Diagnosis not present

## 2019-11-25 DIAGNOSIS — E039 Hypothyroidism, unspecified: Secondary | ICD-10-CM | POA: Diagnosis not present

## 2019-11-25 DIAGNOSIS — I10 Essential (primary) hypertension: Secondary | ICD-10-CM | POA: Diagnosis not present

## 2019-11-25 DIAGNOSIS — M24562 Contracture, left knee: Secondary | ICD-10-CM | POA: Diagnosis not present

## 2019-11-25 DIAGNOSIS — G4700 Insomnia, unspecified: Secondary | ICD-10-CM | POA: Insufficient documentation

## 2019-11-25 DIAGNOSIS — M1712 Unilateral primary osteoarthritis, left knee: Secondary | ICD-10-CM | POA: Insufficient documentation

## 2019-11-25 DIAGNOSIS — I447 Left bundle-branch block, unspecified: Secondary | ICD-10-CM | POA: Diagnosis not present

## 2019-11-25 DIAGNOSIS — K219 Gastro-esophageal reflux disease without esophagitis: Secondary | ICD-10-CM | POA: Insufficient documentation

## 2019-11-25 DIAGNOSIS — E785 Hyperlipidemia, unspecified: Secondary | ICD-10-CM | POA: Insufficient documentation

## 2019-11-25 DIAGNOSIS — M8580 Other specified disorders of bone density and structure, unspecified site: Secondary | ICD-10-CM | POA: Insufficient documentation

## 2019-11-25 DIAGNOSIS — M21162 Varus deformity, not elsewhere classified, left knee: Secondary | ICD-10-CM | POA: Insufficient documentation

## 2019-11-25 DIAGNOSIS — Z881 Allergy status to other antibiotic agents status: Secondary | ICD-10-CM | POA: Diagnosis not present

## 2019-11-25 DIAGNOSIS — G500 Trigeminal neuralgia: Secondary | ICD-10-CM | POA: Insufficient documentation

## 2019-11-25 DIAGNOSIS — J45909 Unspecified asthma, uncomplicated: Secondary | ICD-10-CM | POA: Diagnosis not present

## 2019-11-25 DIAGNOSIS — K279 Peptic ulcer, site unspecified, unspecified as acute or chronic, without hemorrhage or perforation: Secondary | ICD-10-CM | POA: Insufficient documentation

## 2019-11-25 DIAGNOSIS — Z800 Family history of malignant neoplasm of digestive organs: Secondary | ICD-10-CM | POA: Diagnosis not present

## 2019-11-25 HISTORY — PX: TOTAL KNEE ARTHROPLASTY: SHX125

## 2019-11-25 SURGERY — ARTHROPLASTY, KNEE, TOTAL
Anesthesia: Regional | Site: Knee | Laterality: Left

## 2019-11-25 MED ORDER — METOCLOPRAMIDE HCL 5 MG PO TABS: 5.0000 mg | ORAL_TABLET | Freq: Three times a day (TID) | ORAL | Status: DC | PRN

## 2019-11-25 MED ORDER — PHENOL 1.4 % MT LIQD: 1.0000 | OROMUCOSAL | Status: DC | PRN

## 2019-11-25 MED ORDER — ASPIRIN EC 81 MG PO TBEC: 81.0000 mg | DELAYED_RELEASE_TABLET | Freq: Two times a day (BID) | ORAL | 0 refills | Status: AC

## 2019-11-25 MED ORDER — KETOROLAC TROMETHAMINE 0.5 % OP SOLN
1.0000 [drp] | Freq: Three times a day (TID) | OPHTHALMIC | Status: AC | PRN
  Administered 2019-11-25: 1 [drp] via OPHTHALMIC
  Filled 2019-11-25: qty 5

## 2019-11-25 MED ORDER — OXYCODONE HCL 5 MG PO TABS
ORAL_TABLET | ORAL | Status: AC
  Filled 2019-11-25: qty 1

## 2019-11-25 MED ORDER — SODIUM CHLORIDE 0.9 % IV SOLN: INTRAVENOUS | Status: DC

## 2019-11-25 MED ORDER — ACETAMINOPHEN 325 MG PO TABS
325.0000 mg | ORAL_TABLET | Freq: Four times a day (QID) | ORAL | Status: DC | PRN
  Administered 2019-11-26: 650 mg via ORAL
  Filled 2019-11-25: qty 2

## 2019-11-25 MED ORDER — METOCLOPRAMIDE HCL 5 MG/ML IJ SOLN: 5.0000 mg | Freq: Three times a day (TID) | INTRAMUSCULAR | Status: DC | PRN

## 2019-11-25 MED ORDER — POVIDONE-IODINE 10 % EX SWAB
2.0000 "application " | Freq: Once | CUTANEOUS | Status: AC
  Administered 2019-11-25: 2 via TOPICAL

## 2019-11-25 MED ORDER — BUPIVACAINE IN DEXTROSE 0.75-8.25 % IT SOLN
INTRATHECAL | Status: DC | PRN
  Administered 2019-11-25: 1.5 mL via INTRATHECAL

## 2019-11-25 MED ORDER — LIDOCAINE 2% (20 MG/ML) 5 ML SYRINGE
INTRAMUSCULAR | Status: DC | PRN
  Administered 2019-11-25: 40 mg via INTRAVENOUS

## 2019-11-25 MED ORDER — GABAPENTIN 300 MG PO CAPS: ORAL_CAPSULE | ORAL | 0 refills | Status: DC

## 2019-11-25 MED ORDER — DEXAMETHASONE SODIUM PHOSPHATE 10 MG/ML IJ SOLN
INTRAMUSCULAR | Status: DC | PRN
  Administered 2019-11-25: 10 mg

## 2019-11-25 MED ORDER — CEFAZOLIN SODIUM-DEXTROSE 2-4 GM/100ML-% IV SOLN
2.0000 g | INTRAVENOUS | Status: DC
  Filled 2019-11-25: qty 100

## 2019-11-25 MED ORDER — TRANEXAMIC ACID-NACL 1000-0.7 MG/100ML-% IV SOLN
1000.0000 mg | Freq: Once | INTRAVENOUS | Status: AC
  Administered 2019-11-25: 1000 mg via INTRAVENOUS

## 2019-11-25 MED ORDER — DOCUSATE SODIUM 100 MG PO CAPS
100.0000 mg | ORAL_CAPSULE | Freq: Two times a day (BID) | ORAL | Status: DC
  Administered 2019-11-25 – 2019-11-26 (×2): 100 mg via ORAL
  Filled 2019-11-25 (×2): qty 1

## 2019-11-25 MED ORDER — LIDOCAINE 2% (20 MG/ML) 5 ML SYRINGE
INTRAMUSCULAR | Status: AC
  Filled 2019-11-25: qty 5

## 2019-11-25 MED ORDER — DEXAMETHASONE SODIUM PHOSPHATE 10 MG/ML IJ SOLN
INTRAMUSCULAR | Status: AC
  Filled 2019-11-25: qty 1

## 2019-11-25 MED ORDER — DOCUSATE SODIUM 100 MG PO CAPS: 100.0000 mg | ORAL_CAPSULE | Freq: Every day | ORAL | 2 refills | Status: AC | PRN

## 2019-11-25 MED ORDER — ALPRAZOLAM 0.5 MG PO TABS
0.5000 mg | ORAL_TABLET | Freq: Every evening | ORAL | Status: DC | PRN
  Administered 2019-11-25: 0.5 mg via ORAL
  Filled 2019-11-25: qty 1

## 2019-11-25 MED ORDER — BISACODYL 10 MG RE SUPP: 10.0000 mg | Freq: Every day | RECTAL | Status: DC | PRN

## 2019-11-25 MED ORDER — POTASSIUM CHLORIDE CRYS ER 20 MEQ PO TBCR
20.0000 meq | EXTENDED_RELEASE_TABLET | Freq: Every day | ORAL | Status: DC
  Administered 2019-11-26: 20 meq via ORAL
  Filled 2019-11-25: qty 1

## 2019-11-25 MED ORDER — LEVOTHYROXINE SODIUM 100 MCG PO TABS
100.0000 ug | ORAL_TABLET | Freq: Every day | ORAL | Status: DC
  Administered 2019-11-26: 100 ug via ORAL
  Filled 2019-11-25: qty 1

## 2019-11-25 MED ORDER — DULOXETINE HCL 60 MG PO CPEP
60.0000 mg | ORAL_CAPSULE | Freq: Every day | ORAL | Status: DC
  Administered 2019-11-26: 60 mg via ORAL
  Filled 2019-11-25: qty 1

## 2019-11-25 MED ORDER — ROPIVACAINE HCL 5 MG/ML IJ SOLN
INTRAMUSCULAR | Status: DC | PRN
  Administered 2019-11-25: 20 mg via EPIDURAL

## 2019-11-25 MED ORDER — PROPOFOL 500 MG/50ML IV EMUL
INTRAVENOUS | Status: DC | PRN
  Administered 2019-11-25: 100 ug/kg/min via INTRAVENOUS

## 2019-11-25 MED ORDER — CHLORHEXIDINE GLUCONATE 4 % EX LIQD: 60.0000 mL | Freq: Once | CUTANEOUS | Status: DC

## 2019-11-25 MED ORDER — MEPIVACAINE HCL (PF) 2 % IJ SOLN
INTRAMUSCULAR | Status: AC
  Filled 2019-11-25: qty 20

## 2019-11-25 MED ORDER — POLYSACCHARIDE IRON COMPLEX 150 MG PO CAPS: 150.0000 mg | ORAL_CAPSULE | Freq: Every day | ORAL | Status: DC

## 2019-11-25 MED ORDER — SODIUM CHLORIDE (PF) 0.9 % IJ SOLN
INTRAMUSCULAR | Status: AC
  Filled 2019-11-25: qty 50

## 2019-11-25 MED ORDER — ONDANSETRON HCL 4 MG/2ML IJ SOLN
INTRAMUSCULAR | Status: AC
  Filled 2019-11-25: qty 2

## 2019-11-25 MED ORDER — ASPIRIN 81 MG PO CHEW
81.0000 mg | CHEWABLE_TABLET | Freq: Two times a day (BID) | ORAL | Status: DC
  Administered 2019-11-25 – 2019-11-26 (×2): 81 mg via ORAL
  Filled 2019-11-25 (×2): qty 1

## 2019-11-25 MED ORDER — PROPOFOL 500 MG/50ML IV EMUL
INTRAVENOUS | Status: AC
  Filled 2019-11-25: qty 50

## 2019-11-25 MED ORDER — BSS IO SOLN
15.0000 mL | Freq: Once | INTRAOCULAR | Status: DC
  Filled 2019-11-25: qty 15

## 2019-11-25 MED ORDER — HYDROMORPHONE HCL 1 MG/ML IJ SOLN
0.5000 mg | INTRAMUSCULAR | Status: DC | PRN
  Administered 2019-11-25: 0.5 mg via INTRAVENOUS

## 2019-11-25 MED ORDER — TRANEXAMIC ACID 1000 MG/10ML IV SOLN
INTRAVENOUS | Status: DC | PRN
  Administered 2019-11-25: 2000 mg via TOPICAL

## 2019-11-25 MED ORDER — DEXAMETHASONE SODIUM PHOSPHATE 10 MG/ML IJ SOLN
INTRAMUSCULAR | Status: DC | PRN
  Administered 2019-11-25: 8 mg via INTRAVENOUS

## 2019-11-25 MED ORDER — ONDANSETRON HCL 4 MG PO TABS: 4.0000 mg | ORAL_TABLET | Freq: Four times a day (QID) | ORAL | Status: DC | PRN

## 2019-11-25 MED ORDER — FENTANYL CITRATE (PF) 100 MCG/2ML IJ SOLN
INTRAMUSCULAR | Status: DC | PRN
  Administered 2019-11-25 (×2): 50 ug via INTRAVENOUS

## 2019-11-25 MED ORDER — ONDANSETRON HCL 4 MG/2ML IJ SOLN: 4.0000 mg | Freq: Four times a day (QID) | INTRAMUSCULAR | Status: DC | PRN

## 2019-11-25 MED ORDER — ACETAMINOPHEN 325 MG PO TABS: 650.0000 mg | ORAL_TABLET | ORAL | 2 refills | Status: AC | PRN

## 2019-11-25 MED ORDER — LACTATED RINGERS IV BOLUS
250.0000 mL | Freq: Once | INTRAVENOUS | Status: AC
  Administered 2019-11-25: 250 mL via INTRAVENOUS

## 2019-11-25 MED ORDER — AMPHETAMINE-DEXTROAMPHETAMINE 20 MG PO TABS: 20.0000 mg | ORAL_TABLET | Freq: Every day | ORAL | Status: DC | PRN

## 2019-11-25 MED ORDER — TRANEXAMIC ACID-NACL 1000-0.7 MG/100ML-% IV SOLN
1000.0000 mg | INTRAVENOUS | Status: DC
  Filled 2019-11-25: qty 100

## 2019-11-25 MED ORDER — SODIUM CHLORIDE 0.9% FLUSH
INTRAVENOUS | Status: DC | PRN
  Administered 2019-11-25: 50 mL

## 2019-11-25 MED ORDER — ACETAMINOPHEN 500 MG PO TABS
1000.0000 mg | ORAL_TABLET | Freq: Once | ORAL | Status: AC
  Administered 2019-11-25: 1000 mg via ORAL
  Filled 2019-11-25: qty 2

## 2019-11-25 MED ORDER — TRANEXAMIC ACID-NACL 1000-0.7 MG/100ML-% IV SOLN
INTRAVENOUS | Status: DC | PRN
  Administered 2019-11-25: 1000 mg via INTRAVENOUS

## 2019-11-25 MED ORDER — SODIUM CHLORIDE 0.9 % IV SOLN
INTRAVENOUS | Status: DC | PRN
  Administered 2019-11-25: 08:00:00 20 ug/min via INTRAVENOUS

## 2019-11-25 MED ORDER — MIDAZOLAM HCL 2 MG/2ML IJ SOLN
INTRAMUSCULAR | Status: AC
  Filled 2019-11-25: qty 2

## 2019-11-25 MED ORDER — VANCOMYCIN HCL IN DEXTROSE 1-5 GM/200ML-% IV SOLN
1000.0000 mg | INTRAVENOUS | Status: AC
  Administered 2019-11-25: 1000 mg via INTRAVENOUS
  Filled 2019-11-25: qty 200

## 2019-11-25 MED ORDER — OXYCODONE HCL 5 MG PO TABS
5.0000 mg | ORAL_TABLET | ORAL | Status: DC | PRN
  Administered 2019-11-25 (×2): 5 mg via ORAL
  Administered 2019-11-25 – 2019-11-26 (×5): 10 mg via ORAL
  Filled 2019-11-25 (×5): qty 2

## 2019-11-25 MED ORDER — OXYCODONE HCL 5 MG PO TABS: ORAL_TABLET | ORAL | 0 refills | Status: DC

## 2019-11-25 MED ORDER — HYDROMORPHONE HCL 1 MG/ML IJ SOLN
INTRAMUSCULAR | Status: AC
  Filled 2019-11-25: qty 1

## 2019-11-25 MED ORDER — BUPIVACAINE LIPOSOME 1.3 % IJ SUSP
INTRAMUSCULAR | Status: DC | PRN
  Administered 2019-11-25: 20 mL

## 2019-11-25 MED ORDER — PROMETHAZINE HCL 25 MG/ML IJ SOLN: 6.2500 mg | INTRAMUSCULAR | Status: DC | PRN

## 2019-11-25 MED ORDER — CEFAZOLIN SODIUM-DEXTROSE 2-3 GM-%(50ML) IV SOLR
INTRAVENOUS | Status: DC | PRN
  Administered 2019-11-25: 2 g via INTRAVENOUS

## 2019-11-25 MED ORDER — PROPOFOL 10 MG/ML IV BOLUS
INTRAVENOUS | Status: DC | PRN
  Administered 2019-11-25 (×3): 20 mg via INTRAVENOUS

## 2019-11-25 MED ORDER — 0.9 % SODIUM CHLORIDE (POUR BTL) OPTIME
TOPICAL | Status: DC | PRN
  Administered 2019-11-25: 08:00:00 1000 mL

## 2019-11-25 MED ORDER — PROPOFOL 10 MG/ML IV BOLUS
INTRAVENOUS | Status: AC
  Filled 2019-11-25: qty 20

## 2019-11-25 MED ORDER — BUPIVACAINE HCL (PF) 0.25 % IJ SOLN
INTRAMUSCULAR | Status: DC | PRN
  Administered 2019-11-25: 30 mL

## 2019-11-25 MED ORDER — BUPIVACAINE HCL (PF) 0.25 % IJ SOLN
INTRAMUSCULAR | Status: AC
  Filled 2019-11-25: qty 30

## 2019-11-25 MED ORDER — LACTATED RINGERS IV SOLN
INTRAVENOUS | Status: DC
  Administered 2019-11-25: 1000 mL via INTRAVENOUS

## 2019-11-25 MED ORDER — LACTATED RINGERS IV BOLUS
500.0000 mL | Freq: Once | INTRAVENOUS | Status: AC
  Administered 2019-11-25: 500 mL via INTRAVENOUS

## 2019-11-25 MED ORDER — ROSUVASTATIN CALCIUM 10 MG PO TABS
10.0000 mg | ORAL_TABLET | Freq: Every day | ORAL | Status: DC
  Administered 2019-11-25: 10 mg via ORAL
  Filled 2019-11-25: qty 1

## 2019-11-25 MED ORDER — ACETAMINOPHEN 500 MG PO TABS
1000.0000 mg | ORAL_TABLET | Freq: Four times a day (QID) | ORAL | Status: AC
  Administered 2019-11-25 – 2019-11-26 (×2): 1000 mg via ORAL
  Filled 2019-11-25 (×2): qty 2

## 2019-11-25 MED ORDER — FENTANYL CITRATE (PF) 100 MCG/2ML IJ SOLN: 25.0000 ug | INTRAMUSCULAR | Status: DC | PRN

## 2019-11-25 MED ORDER — ONDANSETRON HCL 4 MG/2ML IJ SOLN
INTRAMUSCULAR | Status: DC | PRN
  Administered 2019-11-25: 4 mg via INTRAVENOUS

## 2019-11-25 MED ORDER — SODIUM CHLORIDE 0.9 % IR SOLN
Status: DC | PRN
  Administered 2019-11-25: 1000 mL

## 2019-11-25 MED ORDER — MENTHOL 3 MG MT LOZG: 1.0000 | LOZENGE | OROMUCOSAL | Status: DC | PRN

## 2019-11-25 MED ORDER — FLEET ENEMA 7-19 GM/118ML RE ENEM: 1.0000 | ENEMA | Freq: Once | RECTAL | Status: DC | PRN

## 2019-11-25 MED ORDER — VANCOMYCIN HCL IN DEXTROSE 1-5 GM/200ML-% IV SOLN
1000.0000 mg | Freq: Two times a day (BID) | INTRAVENOUS | Status: AC
  Administered 2019-11-25: 1000 mg via INTRAVENOUS
  Filled 2019-11-25: qty 200

## 2019-11-25 MED ORDER — AMPHETAMINE-DEXTROAMPHETAMINE 20 MG PO TABS
20.0000 mg | ORAL_TABLET | Freq: Every day | ORAL | Status: DC
  Administered 2019-11-26: 20 mg via ORAL
  Filled 2019-11-25: qty 1

## 2019-11-25 MED ORDER — TRANEXAMIC ACID-NACL 1000-0.7 MG/100ML-% IV SOLN
INTRAVENOUS | Status: AC
  Filled 2019-11-25: qty 100

## 2019-11-25 MED ORDER — POLYETHYLENE GLYCOL 3350 17 G PO PACK: 17.0000 g | PACK | Freq: Every day | ORAL | Status: DC | PRN

## 2019-11-25 MED ORDER — MIDAZOLAM HCL 2 MG/2ML IJ SOLN
INTRAMUSCULAR | Status: DC | PRN
  Administered 2019-11-25 (×2): 1 mg via INTRAVENOUS

## 2019-11-25 MED ORDER — GABAPENTIN 300 MG PO CAPS
300.0000 mg | ORAL_CAPSULE | Freq: Three times a day (TID) | ORAL | Status: DC
  Administered 2019-11-25 – 2019-11-26 (×3): 300 mg via ORAL
  Filled 2019-11-25 (×3): qty 1

## 2019-11-25 MED ORDER — FENTANYL CITRATE (PF) 100 MCG/2ML IJ SOLN
INTRAMUSCULAR | Status: AC
  Filled 2019-11-25: qty 2

## 2019-11-25 SURGICAL SUPPLY — 65 items
APL SKNCLS STERI-STRIP NONHPOA (GAUZE/BANDAGES/DRESSINGS) ×1
ATTUNE PS FEM LT SZ 5 CEM KNEE (Femur) ×1 IMPLANT
ATTUNE PSRP INSR SZ5 5 KNEE (Insert) ×1 IMPLANT
BAG DECANTER FOR FLEXI CONT (MISCELLANEOUS) ×2 IMPLANT
BAG SPEC THK2 15X12 ZIP CLS (MISCELLANEOUS) ×1
BAG ZIPLOCK 12X15 (MISCELLANEOUS) ×2 IMPLANT
BASE TIBIAL ROT PLAT SZ 5 KNEE (Knees) IMPLANT
BENZOIN TINCTURE PRP APPL 2/3 (GAUZE/BANDAGES/DRESSINGS) ×2 IMPLANT
BLADE SAGITTAL 25.0X1.19X90 (BLADE) ×2 IMPLANT
BLADE SAW SGTL 13X75X1.27 (BLADE) ×2 IMPLANT
BLADE SURG 15 STRL LF DISP TIS (BLADE) ×1 IMPLANT
BLADE SURG 15 STRL SS (BLADE) ×2
BLADE SURG SZ10 CARB STEEL (BLADE) ×1 IMPLANT
BNDG CMPR MED 15X6 ELC VLCR LF (GAUZE/BANDAGES/DRESSINGS) ×1
BNDG ELASTIC 6X15 VLCR STRL LF (GAUZE/BANDAGES/DRESSINGS) ×2 IMPLANT
BOWL SMART MIX CTS (DISPOSABLE) ×2 IMPLANT
BSPLAT TIB 5 CMNT ROT PLAT STR (Knees) ×1 IMPLANT
CEMENT HV SMART SET (Cement) ×2 IMPLANT
CLSR STERI-STRIP ANTIMIC 1/2X4 (GAUZE/BANDAGES/DRESSINGS) ×3 IMPLANT
COVER SURGICAL LIGHT HANDLE (MISCELLANEOUS) ×2 IMPLANT
COVER WAND RF STERILE (DRAPES) ×2 IMPLANT
CUFF TOURN SGL QUICK 34 (TOURNIQUET CUFF) ×2
CUFF TRNQT CYL 34X4.125X (TOURNIQUET CUFF) ×1 IMPLANT
DECANTER SPIKE VIAL GLASS SM (MISCELLANEOUS) ×3 IMPLANT
DRAPE INCISE IOBAN 66X45 STRL (DRAPES) IMPLANT
DRAPE U-SHAPE 47X51 STRL (DRAPES) ×2 IMPLANT
DRESSING AQUACEL AG SP 3.5X10 (GAUZE/BANDAGES/DRESSINGS) ×1 IMPLANT
DRSG AQUACEL AG ADV 3.5X10 (GAUZE/BANDAGES/DRESSINGS) ×1 IMPLANT
DRSG AQUACEL AG SP 3.5X10 (GAUZE/BANDAGES/DRESSINGS)
DURAPREP 26ML APPLICATOR (WOUND CARE) ×4 IMPLANT
ELECT REM PT RETURN 15FT ADLT (MISCELLANEOUS) ×2 IMPLANT
GLOVE BIOGEL PI IND STRL 8 (GLOVE) ×2 IMPLANT
GLOVE BIOGEL PI INDICATOR 8 (GLOVE) ×2
GLOVE SURG ORTHO 8.0 STRL STRW (GLOVE) ×2 IMPLANT
GLOVE SURG SS PI 7.5 STRL IVOR (GLOVE) ×2 IMPLANT
GOWN STRL REUS W/TWL XL LVL3 (GOWN DISPOSABLE) ×4 IMPLANT
HANDPIECE INTERPULSE COAX TIP (DISPOSABLE) ×2
HOLDER FOLEY CATH W/STRAP (MISCELLANEOUS) IMPLANT
HOOD PEEL AWAY FLYTE STAYCOOL (MISCELLANEOUS) ×2 IMPLANT
IMMOBILIZER KNEE 20 (SOFTGOODS) ×2
IMMOBILIZER KNEE 20 THIGH 36 (SOFTGOODS) ×1 IMPLANT
KIT TURNOVER KIT A (KITS) ×1 IMPLANT
MANIFOLD NEPTUNE II (INSTRUMENTS) ×2 IMPLANT
NEEDLE HYPO 22GX1.5 SAFETY (NEEDLE) ×4 IMPLANT
NS IRRIG 1000ML POUR BTL (IV SOLUTION) ×1 IMPLANT
PACK ICE MAXI GEL EZY WRAP (MISCELLANEOUS) ×1 IMPLANT
PACK TOTAL KNEE CUSTOM (KITS) ×2 IMPLANT
PATELLA MEDIAL ATTUN 35MM KNEE (Knees) ×1 IMPLANT
PENCIL SMOKE EVACUATOR (MISCELLANEOUS) ×1 IMPLANT
PIN DRILL FIX HALF THREAD (BIT) ×1 IMPLANT
PIN STEINMAN FIXATION KNEE (PIN) ×1 IMPLANT
PROTECTOR NERVE ULNAR (MISCELLANEOUS) ×2 IMPLANT
SET HNDPC FAN SPRY TIP SCT (DISPOSABLE) ×1 IMPLANT
SUT ETHIBOND NAB CT1 #1 30IN (SUTURE) ×5 IMPLANT
SUT MNCRL AB 3-0 PS2 18 (SUTURE) ×2 IMPLANT
SUT VIC AB 0 CT1 36 (SUTURE) ×1 IMPLANT
SUT VIC AB 2-0 CT1 27 (SUTURE) ×4
SUT VIC AB 2-0 CT1 TAPERPNT 27 (SUTURE) ×2 IMPLANT
SYR CONTROL 10ML LL (SYRINGE) ×6 IMPLANT
TIBIAL BASE ROT PLAT SZ 5 KNEE (Knees) ×2 IMPLANT
TRAY CATH 16FR W/PLASTIC CATH (SET/KITS/TRAYS/PACK) ×1 IMPLANT
TRAY FOLEY MTR SLVR 16FR STAT (SET/KITS/TRAYS/PACK) ×1 IMPLANT
WATER STERILE IRR 1000ML POUR (IV SOLUTION) ×4 IMPLANT
WRAP KNEE MAXI GEL POST OP (GAUZE/BANDAGES/DRESSINGS) ×1 IMPLANT
YANKAUER SUCT BULB TIP 10FT TU (MISCELLANEOUS) ×2 IMPLANT

## 2019-11-25 NOTE — Interval H&P Note (Signed)
History and Physical Interval Note:  11/25/2019 7:32 AM  Valerie Davis  has presented today for surgery, with the diagnosis of OA LEFT KNEE.  The various methods of treatment have been discussed with the patient and family. After consideration of risks, benefits and other options for treatment, the patient has consented to  Procedure(s): TOTAL KNEE ARTHROPLASTY (Left) as a surgical intervention.  The patient's history has been reviewed, patient examined, no change in status, stable for surgery.  I have reviewed the patient's chart and labs.  Questions were answered to the patient's satisfaction.     Yvette Rack

## 2019-11-25 NOTE — Brief Op Note (Signed)
11/25/2019  9:40 AM  PATIENT:  Valerie Davis  79 y.o. female  PRE-OPERATIVE DIAGNOSIS:  OA LEFT KNEE  POST-OPERATIVE DIAGNOSIS:  OA LEFT KNEE  PROCEDURE:  Procedure(s): TOTAL KNEE ARTHROPLASTY (Left)  SURGEON:  Surgeon(s) and Role:    Earlie Server, MD - Primary  PHYSICIAN ASSISTANT: Chriss Czar, PA-C  ASSISTANTS: OR staff x1   ANESTHESIA:   local, regional, spinal and IV sedation  EBL:  minimal   BLOOD ADMINISTERED:none  DRAINS: none   LOCAL MEDICATIONS USED:  MARCAINE     SPECIMEN:  No Specimen  DISPOSITION OF SPECIMEN:  N/A  COUNTS:  YES  TOURNIQUET:   Total Tourniquet Time Documented: Thigh (Left) - 51 minutes Total: Thigh (Left) - 51 minutes   DICTATION: .Other Dictation: Dictation Number unknown  PLAN OF CARE: Discharge to home after PACU  PATIENT DISPOSITION:  PACU - hemodynamically stable.   Delay start of Pharmacological VTE agent (>24hrs) due to surgical blood loss or risk of bleeding: yes

## 2019-11-25 NOTE — Evaluation (Signed)
Physical Therapy Evaluation Patient Details Name: Valerie Davis MRN: [id number] DOB: Mar 01, 1941 Today's Date: 11/25/2019   History of Present Illness  Patient is 79 y.o. female s/p Lt TKA on 11/25/19 hypothyroisism, HTN, GERD, OA, depression.  Clinical Impression  Valerie Davis is a 79 y.o. female POD 0 s/p Lt TKA. Patient reports independence with mobility at baseline. Patient is now limited by functional impairments (see PT problem list below) and requires min guard for transfers and gait with RW. Patient was able to ambulate ~100 feet with RW and min guard assist. Patient instructed in exercise to facilitate ROM and circulation. Patient will benefit from continued skilled PT interventions to address impairments and progress towards PLOF. Acute PT will follow to progress mobility and stair training in preparation for safe discharge home.     Follow Up Recommendations Follow surgeon's recommendation for DC plan and follow-up therapies    Equipment Recommendations  None recommended by PT    Recommendations for Other Services       Precautions / Restrictions Precautions Precautions: Fall Restrictions Weight Bearing Restrictions: No      Mobility  Bed Mobility Overal bed mobility: Needs Assistance Bed Mobility: Supine to Sit;Sit to Supine     Supine to sit: Supervision;HOB elevated Sit to supine: Supervision;HOB elevated   General bed mobility comments: no assist or cues needed, pt using bed rails, returned to bed as pt declined to sit up in chair  Transfers Overall transfer level: Needs assistance Equipment used: Rolling walker (2 wheeled) Transfers: Sit to/from Stand Sit to Stand: Min guard         General transfer comment: cues for hand placement with RW and no assist required for power up, guarding for safety  Ambulation/Gait Ambulation/Gait assistance: Min guard Gait Distance (Feet): 100 Feet Assistive device: Rolling walker (2 wheeled) Gait Pattern/deviations:  Step-through pattern;Decreased stride length;Decreased stance time - left Gait velocity: slight decrease   General Gait Details: pt maintained safe proximity and hand placement in RW throughout gait, no overt LOB  Stairs            Wheelchair Mobility    Modified Rankin (Stroke Patients Only)       Balance Overall balance assessment: Needs assistance Sitting-balance support: Feet supported Sitting balance-Leahy Scale: Good     Standing balance support: During functional activity;Bilateral upper extremity supported Standing balance-Leahy Scale: Fair                Pertinent Vitals/Pain Pain Assessment: Faces Faces Pain Scale: Hurts a little bit Pain Location: Lt knee Pain Descriptors / Indicators: Discomfort Pain Intervention(s): Limited activity within patient's tolerance;Monitored during session;Repositioned    Home Living Family/patient expects to be discharged to:: Private residence Living Arrangements: Alone Available Help at Discharge: Family;Available 24 hours/day(pt's daughter will stay for 2 weeks) Type of Home: House Home Access: Stairs to enter Entrance Stairs-Rails: None Entrance Stairs-Number of Steps: pt has a little threshold at her door Home Layout: One level Home Equipment: [place] - 2 wheels;Bedside commode;Grab bars - tub/shower;Cane - single point;Crutches      Prior Function Level of Independence: Independent               Hand Dominance   Dominant Hand: Right    Extremity/Trunk Assessment   Upper Extremity Assessment Upper Extremity Assessment: Overall WFL for tasks assessed    Lower Extremity Assessment Lower Extremity Assessment: LLE deficits/detail LLE Deficits / Details: good quad activation and no extensor lag with SLR LLE Sensation: WNL  LLE Coordination: WNL    Cervical / Trunk Assessment Cervical / Trunk Assessment: Normal  Communication   Communication: No difficulties  Cognition Arousal/Alertness:  Awake/alert Behavior During Therapy: WFL for tasks assessed/performed Overall Cognitive Status: Within Functional Limits for tasks assessed               General Comments      Exercises Total Joint Exercises Ankle Circles/Pumps: AROM;10 reps;Supine;Both Quad Sets: AROM;5 reps;Supine;Left Heel Slides: 5 reps;AAROM;Supine;Left   Assessment/Plan    PT Assessment Patient needs continued PT services  PT Problem List Decreased strength;Decreased balance;Decreased mobility;Decreased range of motion;Decreased activity tolerance;Decreased knowledge of use of DME       PT Treatment Interventions DME instruction;Patient/family education;Balance training;Functional mobility training;Gait training;Therapeutic activities;Therapeutic exercise;Stair training    PT Goals (Current goals can be found in the Care Plan section)  Acute Rehab PT Goals Patient Stated Goal: to get back to walking for exercise PT Goal Formulation: With patient Time For Goal Achievement: 12/02/19 Potential to Achieve Goals: Good    Frequency 7X/week    AM-PAC PT "6 Clicks" Mobility  Outcome Measure Help needed turning from your back to your side while in a flat bed without using bedrails?: None Help needed moving from lying on your back to sitting on the side of a flat bed without using bedrails?: None Help needed moving to and from a bed to a chair (including a wheelchair)?: A Little Help needed standing up from a chair using your arms (e.g., wheelchair or bedside chair)?: A Little Help needed to walk in hospital room?: A Little Help needed climbing 3-5 steps with a railing? : A Little 6 Click Score: 20    End of Session Equipment Utilized During Treatment: Gait belt Activity Tolerance: Patient tolerated treatment well Patient left: in bed;with call bell/phone within reach Nurse Communication: Mobility status PT Visit Diagnosis: Muscle weakness (generalized) (M62.81);Difficulty in walking, not elsewhere  classified (R26.2)    Time: [id number] PT Time Calculation (min) (ACUTE ONLY): 23 min   Charges:   PT Evaluation $PT Eval Low Complexity: 1 Low PT Treatments $Therapeutic Exercise: 8-22 mins        Verner Mould, DPT Physical Therapist with [facility] [phone]  11/25/2019 7:28 PM

## 2019-11-25 NOTE — Plan of Care (Signed)
Plan of care for post op day 0 disucssed with patient and daughter Valerie Davis.   All questions answered.   Will continue to monitor.    [place], Therapist, sports

## 2019-11-25 NOTE — Anesthesia Postprocedure Evaluation (Signed)
Anesthesia Post Note  Patient: Valerie Davis  Procedure(s) Performed: TOTAL KNEE ARTHROPLASTY (Left Knee)     Patient location during evaluation: PACU Anesthesia Type: Regional, Spinal and MAC Level of consciousness: awake and alert Pain management: pain level controlled Vital Signs Assessment: post-procedure vital signs reviewed and stable Respiratory status: spontaneous breathing, nonlabored ventilation and respiratory function stable Cardiovascular status: blood pressure returned to baseline and stable Postop Assessment: no apparent nausea or vomiting Anesthetic complications: no    Last Vitals:  Vitals:   11/25/19 1015 11/25/19 1030  BP: 125/77 128/85  Pulse: 79 73  Resp: 18 15  Temp:  36.4 C  SpO2: 93% 95%    Last Pain:  Vitals:   11/25/19 1015  TempSrc:   PainSc: 0-No pain                 Pervis Hocking

## 2019-11-25 NOTE — Anesthesia Procedure Notes (Addendum)
Anesthesia Regional Block: Adductor canal block   Pre-Anesthetic Checklist: ,, timeout performed, Correct Patient, Correct Site, Correct Laterality, Correct Procedure, Correct Position, site marked, Risks and benefits discussed,  Surgical consent,  Pre-op evaluation,  At surgeon's request and post-op pain management  Laterality: Left  Prep: Maximum Sterile Barrier Precautions used, chloraprep       Needles:  Injection technique: Single-shot  Needle Type: Echogenic Stimulator Needle     Needle Length: 9cm  Needle Gauge: 22     Additional Needles:   Procedures:,,,, ultrasound used (permanent image in chart),,,,  Narrative:  Start time: 11/25/2019 6:50 AM End time: 11/25/2019 7:00 AM Injection made incrementally with aspirations every 5 mL.  Performed by: Personally  Anesthesiologist: Pervis Hocking, DO  Additional Notes: Monitors applied. No increased pain on injection. No increased resistance to injection. Injection made in 5cc increments. Good needle visualization. Patient tolerated procedure well.

## 2019-11-25 NOTE — Progress Notes (Signed)
Orthopedic Tech Progress Note Patient Details:  Valerie Davis 06-12-1941 [id number]  Ortho Devices Type of Ortho Device: Bone foam zero knee Ortho Device/Splint Location: LLE Ortho Device/Splint Interventions: Ordered   Post Interventions Patient Tolerated: Well Instructions Provided: Care of device   Staci Righter 11/25/2019, 11:07 AM

## 2019-11-25 NOTE — Anesthesia Procedure Notes (Addendum)
Spinal  Patient location during procedure: OR Start time: 11/25/2019 7:47 AM Staffing Performed: anesthesiologist  Anesthesiologist: Pervis Hocking, DO Resident/CRNA: Eben Burow, CRNA Preanesthetic Checklist Completed: patient identified, IV checked, site marked, risks and benefits discussed, surgical consent, monitors and equipment checked, pre-op evaluation and timeout performed Spinal Block Patient position: sitting Prep: ChloraPrep and site prepped and draped Patient monitoring: continuous pulse ox, blood pressure, heart rate and cardiac monitor Approach: midline Location: L3-4 Injection technique: single-shot Needle Needle type: Quincke  Needle gauge: 22 G Needle length: 9 cm Additional Notes Pt placed in sitting position, spinal kit expiration date checked and verified, Jefm Miles, CRNA attempted SAB x1 with 24 g pencan, Dr Doroteo Glassman placed SAB with 22g  + CSF, - heme, pt tolerated well.

## 2019-11-25 NOTE — Op Note (Signed)
Valerie Davis, TERPENNING MEDICAL RECORD [id number] ACCOUNT [id number] DATE OF BIRTH:March 17, 1941 FACILITY: [facility] LOCATION: [facility] PHYSICIAN:W. Aveer Bartow JR., MD  OPERATIVE REPORT  DATE OF PROCEDURE:  11/25/2019  PREOPERATIVE  DIAGNOSIS:  Severe osteoarthritis, left knee with varus deformity and flexion contracture.  POSTOPERATIVE DIAGNOSIS:  Severe osteoarthritis, left knee with varus deformity and flexion contracture.  OPERATION:  Left total knee replacement (Attune size 5 femur____ 5 mm tibial bearing and 35 mm all poly patella.  SURGEON:  Vangie Bicker, MD  ASSISTANT:  Jennet Maduro  ANESTHESIA:  Spinal with a nerve block.  TOURNIQUET TIME:  50 minutes.  DESCRIPTION OF PROCEDURE:  In the  supine position, exsanguination of leg inflation to 350.  Straight skin incision with medial parapatellar approach to the knee made.  We did a 5-degree valgus, 9 mm resection of the distal femur, followed by cutting 9  mm below the least diseased lateral compartment with the extension gap eventually measured at 5 mm.  Femur was sized to be a size 5, placement of an all-in-1 cutting block in appropriate the degree of external rotation, accomplishing the anterior,  posterior, chamfer cuts.  Osteophytes and remnants of the menisci.  PCL was released from the posterior aspect of the knee, flexion gap equal to extension gap at 5 mm.  Tibia was sized to be a size 5, followed by placement of the keel with the keel cut,  followed by the tibial baseplate.  Box cut was next made for the femur.  The patient had 10-15 degree flexion contracture.  It was noted to be resolved with full extension on the table with the trial.  Patella was cut, resecting 7.5 mm of native patella,  placement with a 35 mm all poly trial.  All trials were deemed to be acceptable.  The trial components were removed.  Pulsatile lavage was used on the bone.  We then prepared the cement on the backtable.  A mixture of Exparel and  Marcaine was  infiltrated in the subcutaneous tissues and capsular structures.  Final components were inserted, tibia, followed by femur and patella, allowed to harden.  Trial bearing was removed, excess cement was removed posterior aspect of the knee.  Tourniquet was  released.  No excessive bleeding was noted.  Final bearing was placed, a 5 mm thick bearing.  Closure was effected with #1 Ethibond, 2-0 Vicryl and Monocryl in the skin.  A lightly compressive sterile dressing and knee immobilizer applied.  VN/NUANCE  D:11/25/2019 T:11/25/2019 JOB:[id number]

## 2019-11-25 NOTE — Anesthesia Procedure Notes (Signed)
Procedure Name: MAC Date/Time: 11/25/2019 7:38 AM Performed by: Eben Burow, CRNA Pre-anesthesia Checklist: Patient identified, Emergency Drugs available, Suction available, Patient being monitored and Timeout performed Oxygen Delivery Method: Simple face mask Dental Injury: Teeth and Oropharynx as per pre-operative assessment

## 2019-11-25 NOTE — Discharge Summary (Signed)
PATIENT ID: Valerie Davis        MRN:  [id number]          DOB/AGE: June 15, 1941 / 79 y.o.    DISCHARGE SUMMARY  ADMISSION DATE:    11/25/2019 DISCHARGE DATE:   11/26/2019  ADMISSION DIAGNOSIS: Primary localized osteoarthritis of left knee [M17.12]    DISCHARGE DIAGNOSIS:  OA LEFT KNEE    ADDITIONAL DIAGNOSIS: Active Problems:   Primary localized osteoarthritis of left knee  Past Medical History:  Diagnosis Date  . ADD (attention deficit disorder)   . Arthritis   . Asthma   . Blood transfusion without reported diagnosis   . Depression   . Diverticulosis   . GERD (gastroesophageal reflux disease)   . H/O: GI bleed    erosive gastritis (NSAID) 2007 in [place]  . Hypertension   . Hypothyroidism   . Insomnia   . Iron deficiency anemia   . Osteopenia   . Personal history of colonic adenoma 07/06/2013  . Pneumonia   . Trigeminal neuralgia     hx of    PROCEDURE: Procedure(s): TOTAL KNEE ARTHROPLASTY Left on 11/25/2019  CONSULTS: PT    HISTORY:  See H&P in chart  HOSPITAL COURSE:  Valerie Davis is a 79 y.o. admitted on 11/25/2019 and found to have a diagnosis of OA LEFT KNEE.  After appropriate laboratory studies were obtained  they were taken to the operating room on 11/25/2019 and underwent  Procedure(s): TOTAL KNEE ARTHROPLASTY  Left.   They were given perioperative antibiotics:  Anti-infectives (From admission, onward)   Start     Dose/Rate Route Frequency Ordered Stop   11/25/19 1900  vancomycin (VANCOCIN) IVPB 1000 mg/200 mL premix     1,000 mg 200 mL/hr over 60 Minutes Intravenous Every 12 hours 11/25/19 1011 11/25/19 2034   11/25/19 0600  vancomycin (VANCOCIN) IVPB 1000 mg/200 mL premix     1,000 mg 200 mL/hr over 60 Minutes Intravenous On call to O.R. 11/25/19 0532 11/25/19 0741   11/25/19 0600  ceFAZolin (ANCEF) IVPB 2g/100 mL premix  Status:  Discontinued     2 g 200 mL/hr over 30 Minutes Intravenous On call to O.R. 11/25/19 0532 11/25/19 1618    .  Tolerated the  procedure well.  In and out cath post op.  POD #1, allowed out of bed to a chair.  PT for ambulation and exercise program.  IV saline locked.  O2 discontionued.  The remainder of the hospital course was dedicated to ambulation and strengthening.   The patient was discharged on post op day one in  Stable condition.  Blood products given:none  DIAGNOSTIC STUDIES: Recent vital signs:  Patient Vitals for the past 24 hrs:  BP Temp Temp src Pulse Resp SpO2 Height Weight  11/25/19 2051 122/86 97.9 F (36.6 C) Oral 95 16 93 % -- --  11/25/19 1757 113/73 97.9 F (36.6 C) -- 96 18 90 % 5\' 3"  (1.6 m) 75.8 kg  11/25/19 1703 106/70 -- -- 80 -- -- -- --  11/25/19 1627 -- 97.8 F (36.6 C) Oral 93 -- 93 % -- --  11/25/19 1500 120/70 -- -- 84 16 97 % -- --  11/25/19 1400 116/76 98 F (36.7 C) -- 85 16 98 % -- --  11/25/19 1300 105/68 -- -- 89 16 99 % -- --  11/25/19 1200 119/60 -- -- 76 16 92 % -- --  11/25/19 1045 138/86 98 F (36.7 C) -- 80 15 92 % -- --  11/25/19 1030 128/85 97.6 F (36.4 C) -- 73 15 95 % -- --  11/25/19 1015 125/77 -- -- 79 18 93 % -- --  11/25/19 1000 106/90 -- -- 72 13 98 % -- --  11/25/19 0950 128/74 97.8 F (36.6 C) -- 78 17 96 % -- --  11/25/19 0545 120/83 98.3 F (36.8 C) Oral 84 18 98 % -- --       Recent laboratory studies: No results for input(s): WBC, HGB, HCT, PLT in the last 168 hours. No results for input(s): NA, K, CL, CO2, BUN, CREATININE, GLUCOSE, CALCIUM in the last 168 hours. No results found for: INR, PROTIME   Recent Radiographic Studies :  No results found.  DISCHARGE INSTRUCTIONS:   DISCHARGE MEDICATIONS:   Allergies as of 11/25/2019      Reactions   Wellbutrin [bupropion] Anaphylaxis   Ace Inhibitors Cough   Amlodipine Besylate    malaise    Bupropion Hcl    urticaria, angioedema caused by wellbutrin   Levofloxacin    abdominal pain   Pantoprazole Sodium Rash      Medication List    TAKE these medications   acetaminophen 325 MG  tablet Commonly known as: Tylenol Take 2 tablets (650 mg total) by mouth every 4 (four) hours as needed.   ALPRAZolam 0.5 MG tablet Commonly known as: XANAX Take 0.5 mg by mouth at bedtime as needed for sleep.   amphetamine-dextroamphetamine 20 MG tablet Commonly known as: ADDERALL Take 20 mg by mouth See admin instructions. Take 20 mg in the morning, may take second 20 mg dose as needed ADD symptoms   aspirin EC 81 MG tablet Take 1 tablet (81 mg total) by mouth 2 (two) times daily. TO PREVENT BLOOD CLOTS   cholecalciferol 25 MCG (1000 UT) tablet Commonly known as: VITAMIN D3 Take 1,000 Units by mouth at bedtime.   cyanocobalamin 1000 MCG/ML injection Commonly known as: (VITAMIN B-12) Inject 1,000 mcg into the muscle every 30 (thirty) days.   docusate sodium 100 MG capsule Commonly known as: Colace Take 1 capsule (100 mg total) by mouth daily as needed.   DULoxetine 60 MG capsule Commonly known as: CYMBALTA Take 60 mg by mouth daily.   gabapentin 300 MG capsule Commonly known as: Neurontin Take 1 capsule (300 mg total) by mouth 3 (three) times daily for 14 days, THEN 1 capsule (300 mg total) 2 (two) times daily for 3 days, THEN 1 capsule (300 mg total) daily for 4 days. Start taking on: November 25, 2019   levothyroxine 100 MCG tablet Commonly known as: SYNTHROID Take 100 mcg by mouth daily before breakfast.   omeprazole 40 MG capsule Commonly known as: PRILOSEC Take 40 mg by mouth at bedtime.   oxyCODONE 5 MG immediate release tablet Commonly known as: Oxy IR/ROXICODONE Take one tab po q4-6hrs prn pain, may need 1-2 first couple weeks   Poly-Iron 150 150 MG capsule Generic drug: iron polysaccharides Take 150 mg by mouth at bedtime.   potassium chloride SA 20 MEQ tablet Commonly known as: KLOR-CON Take 20 mEq by mouth daily.   rosuvastatin 10 MG tablet Commonly known as: CRESTOR Take 10 mg by mouth at bedtime.   valsartan-hydrochlorothiazide 320-25 MG  tablet Commonly known as: DIOVAN-HCT Take 1 tablet by mouth daily.   vitamin C 1000 MG tablet Take 1,000 mg by mouth 2 (two) times daily.       FOLLOW UP VISIT:   Follow-up Information    Earlie Server,  MD. Daphane Shepherd on 12/13/2019.   Specialty: Orthopedic Surgery Why: Your appointment has been scheduled for 11:15.  Contact information: [place] [address] [phone]        [facility] Follow up.   Specialty: Home Health Services Why: HHPT will see you at home until you start Outpatient physical therapy on 12/14/19.  Contact information: [address] [place] [address] [place]. Go on 12/14/2019.   Why: Referral sent to facility requesting start of therapy for 12/14/19. They will call you with date and time  Contact information: [phone]          DISPOSITION:   Home  CONDITION:  Stable   Chriss Czar, PA-C  11/25/2019 10:00 PM

## 2019-11-25 NOTE — Transfer of Care (Signed)
Immediate Anesthesia Transfer of Care Note  Patient: Valerie Davis  Procedure(s) Performed: TOTAL KNEE ARTHROPLASTY (Left Knee)  Patient Location: PACU  Anesthesia Type:Spinal  Level of Consciousness: awake, alert  and oriented  Airway & Oxygen Therapy: Patient Spontanous Breathing and Patient connected to face mask oxygen  Post-op Assessment: Report given to RN and Post -op Vital signs reviewed and stable  Post vital signs: Reviewed and stable  Last Vitals:  Vitals Value Taken Time  BP 128/74 11/25/19 0950  Temp    Pulse 77 11/25/19 0953  Resp 12 11/25/19 0953  SpO2 97 % 11/25/19 0953  Vitals shown include unvalidated device data.  Last Pain:  Vitals:   11/25/19 0600  TempSrc:   PainSc: 0-No pain      Patients Stated Pain Goal: 3 ([id number])  Complications: No apparent anesthesia complications

## 2019-11-25 NOTE — Discharge Instructions (Signed)

## 2019-11-26 ENCOUNTER — Encounter (HOSPITAL_COMMUNITY): Payer: Self-pay | Admitting: Orthopedic Surgery

## 2019-11-26 DIAGNOSIS — M1712 Unilateral primary osteoarthritis, left knee: Secondary | ICD-10-CM | POA: Diagnosis not present

## 2019-11-26 NOTE — Plan of Care (Signed)
  Problem: Education: Goal: Knowledge of the prescribed therapeutic regimen will improve Outcome: Progressing   Problem: Pain Management: Goal: Pain level will decrease with appropriate interventions Outcome: Progressing   Problem: Activity: Goal: Risk for activity intolerance will decrease Outcome: Progressing   

## 2019-11-26 NOTE — TOC Initial Note (Signed)
Transition of Care [facility]) - Initial/Assessment Note    Patient Details  Name: Valerie Davis MRN: [id number] Date of Birth: 11-Apr-1941  [facility] CM/SW Contact:    Joaquin Courts, RN Phone Number: 11/26/2019, 10:03 AM  Clinical Narrative:     Patient prearr[facility] for [facility]. Patient reports to bedside RN that she has rolling walker and 3-in-1.               Expected Discharge Plan: [place] Barriers to Discharge: No Barriers Identified   Patient Goals and CMS Choice Patient states their goals for this hospitalization and ongoing recovery are:: to go home with therapy CMS Medicare.gov Compare Post Acute Care list provided to:: Patient Choice offered to / list presented to : Patient  Expected Discharge Plan and Services Expected Discharge Plan: [place]   Discharge Planning Services: CM Consult Post Acute Care Choice: [place] arrangements for the past 2 months: Single Family Home Expected Discharge Date: 11/26/19               DME Arranged: N/A DME Agency: NA       HH Arranged: PT HH Agency: [facility] (formerly [employer])        Prior Living Arrangements/Services Living arrangements for the past 2 months: [place] with:: Self Patient language and need for interpreter reviewed:: Yes Do you feel safe going back to the place where you live?: Yes      Need for Family Participation in Patient Care: Yes (Comment) Care giver support system in place?: Yes (comment)   Criminal Activity/Legal Involvement Pertinent to Current Situation/Hospitalization: No - Comment as needed  Activities of Daily Living Home Assistive Devices/Equipment: Bedside commode/3-in-1 ADL Screening (condition at time of admission) Patient's cognitive ability adequate to safely complete daily activities?: Yes Is the patient deaf or have difficulty hearing?: No Does the patient  have difficulty seeing, even when wearing glasses/contacts?: No Does the patient have difficulty concentrating, remembering, or making decisions?: No Patient able to express need for assistance with ADLs?: Yes Does the patient have difficulty dressing or bathing?: No Independently performs ADLs?: Yes (appropriate for developmental age) Does the patient have difficulty walking or climbing stairs?: No Weakness of Legs: None Weakness of Arms/Hands: None  Permission Sought/Granted                  Emotional Assessment   Attitude/Demeanor/Rapport: Engaged Affect (typically observed): Accepting Orientation: : Oriented to Self, Oriented to Place, Oriented to  Time, Oriented to Situation   Psych Involvement: No (comment)  Admission diagnosis:  Primary localized osteoarthritis of left knee [M17.12] Patient Active Problem List   Diagnosis Date Noted  . Primary localized osteoarthritis of left knee 11/25/2019  . 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 Chronic upper GI bleeding 08/19/2013  . Cameron ulcer 08/19/2013  . Orthostasis 08/19/2013  . Near syncope 08/19/2013  . Personal history of colonic adenoma 07/06/2013  . INSOMNIA-SLEEP DISORDER-UNSPEC 09/05/2010  . NEURALGIA, TRIGEMINAL 06/06/2010  . HYPOTHYROIDISM 04/26/2010  . ANEMIA-IRON DEFICIENCY 04/26/2010  . HYPERTENSION 04/26/2010  . ANEMIA, B12 DEFICIENCY 12/17/2009   PCP:  Marton Redwood, MD Pharmacy:   [facility], [place] - [address] [place] [address] Phone: [phone] Fax: [phone]  [id number] - [place], [place]  [place] [id number] [place] [id number] Phone: [phone] Fax: [phone]     Social Determinants of Health (SDOH) Interventions    Readmission Risk Interventions No flowsheet data found.

## 2019-11-26 NOTE — Progress Notes (Signed)
Physical Therapy Treatment Patient Details Name: Valerie Davis MRN: [id number] DOB: [birth year] Today's Date: 11/26/2019    History of Present Illness Patient is 79 y.o. female s/p Lt TKA on 11/25/19 hypothyroisism, HTN, GERD, OA, depression.    PT Comments    POD # 1 am session Pt OOB in recliner.  Assisted with amb an increased distance in hallway Then returned to room to perform some TE's following HEP handout.  Instructed on proper tech, freq as well as use of ICE.   Addressed all mobility questions, discussed appropriate activity, educated on use of ICE.  Pt ready for D/C to home. Pt instructed on blue block.    Follow Up Recommendations  Follow surgeon's recommendation for DC plan and follow-up therapies     Equipment Recommendations  None recommended by PT    Recommendations for Other Services       Precautions / Restrictions Precautions Precautions: Fall Restrictions Weight Bearing Restrictions: No Other Position/Activity Restrictions: WBAT    Mobility  Bed Mobility Overal bed mobility: Needs Assistance Bed Mobility: Supine to Sit;Sit to Supine     Supine to sit: Supervision;HOB elevated Sit to supine: Supervision;HOB elevated   General bed mobility comments: OOB in recliner  Transfers Overall transfer level: Needs assistance Equipment used: Rolling walker (2 wheeled) Transfers: Sit to/from [facility] Sit to Stand: Supervision Stand pivot transfers: Supervision;Min guard       General transfer comment: <25% VC's on safety with turns and to extend LE prior to sit  Ambulation/Gait Ambulation/Gait assistance: Supervision Gait Distance (Feet): 125 Feet Assistive device: Rolling walker (2 wheeled) Gait Pattern/deviations: Step-through pattern;Decreased stride length;Decreased stance time - left Gait velocity: WFL   General Gait Details: good safety cognition and use of walker.   Stairs             Wheelchair Mobility     Modified Rankin (Stroke Patients Only)       Balance Overall balance assessment: Needs assistance Sitting-balance support: Feet supported Sitting balance-Leahy Scale: Good     Standing balance support: During functional activity;Bilateral upper extremity supported Standing balance-Leahy Scale: Good Standing balance comment: able to bend over to pick item up off ground without loss of balance                            Cognition Arousal/Alertness: Awake/alert Behavior During Therapy: WFL for tasks assessed/performed Overall Cognitive Status: Within Functional Limits for tasks assessed                                 General Comments: highly motivated      Exercises   Total Knee Replacement TE's 10 reps B LE ankle pumps 10 reps towel squeezes 10 reps knee presses 10 reps heel slides  10 reps SAQ's 10 reps SLR's 10 reps ABD Followed by ICE     General Comments General comments (skin integrity, edema, etc.): vss;SpO2 >95% RA throughout session      Pertinent Vitals/Pain Pain Assessment: 0-10 Pain Score: 5  Pain Location: L knee Pain Descriptors / Indicators: Operative site guarding;Discomfort;Tightness;Tender Pain Intervention(s): Monitored during session;Premedicated before session;Repositioned;Ice applied    Home Living Family/patient expects to be discharged to:: Private residence Living Arrangements: Alone Available Help at Discharge: Family;Available 24 hours/day(pt's daughter will stay for 2 weeks) Type of Home: House Home Access: Stairs to enter Entrance Stairs-Rails: None Home Layout: One  level Home Equipment: Walker - 2 wheels;Bedside commode;Grab bars - tub/shower;Cane - single point;Crutches      Prior Function Level of Independence: Independent          PT Goals (current goals can now be found in the care plan section) Acute Rehab PT Goals Patient Stated Goal: to get back to walking for exercise Progress towards PT  goals: Progressing toward goals    Frequency    7X/week      PT Plan Current plan remains appropriate    Co-evaluation              AM-PAC PT "6 Clicks" Mobility   Outcome Measure  Help needed turning from your back to your side while in a flat bed without using bedrails?: None Help needed moving from lying on your back to sitting on the side of a flat bed without using bedrails?: None Help needed moving to and from a bed to a chair (including a wheelchair)?: None Help needed standing up from a chair using your arms (e.g., wheelchair or bedside chair)?: None Help needed to walk in hospital room?: None Help needed climbing 3-5 steps with a railing? : A Little 6 Click Score: 23    End of Session Equipment Utilized During Treatment: Gait belt Activity Tolerance: Patient tolerated treatment well Patient left: in chair;with call bell/phone within reach;with chair alarm set Nurse Communication: Mobility status(pt ready for D/C) PT Visit Diagnosis: Muscle weakness (generalized) (M62.81);Difficulty in walking, not elsewhere classified (R26.2)     Time: [id number] PT Time Calculation (min) (ACUTE ONLY): 26 min  Charges:  $Gait Training: 8-22 mins $Therapeutic Exercise: 8-22 mins                     Rica Koyanagi  PTA Acute  Rehabilitation Services Pager      [phone] Office      [phone]

## 2019-11-26 NOTE — Plan of Care (Signed)
Patient discharged home in stable condition 

## 2019-11-26 NOTE — Progress Notes (Signed)
Occupational Therapy Evaluation Patient Details Name: Valerie Davis MRN: [id number] DOB: [birth year] Today's Date: 11/26/2019    History of Present Illness Patient is 79 y.o. female s/p Lt TKA on 11/25/19 hypothyroisism, HTN, GERD, OA, depression.   Clinical Impression   PTA, pt was living at home, her daughter is able to stay with her as long as she needs. Pt reports she was independent ADL/IADL and functional mobility. Pt currently requires minguard-supervision for completion of ADL. Pt demonstrates good safety awareness. Anticipate pt will continue to progress toward baseline with available assistance from her daughter at d/c without need for followup OT services. Patient evaluated by Occupational Therapy with no further acute OT needs identified. All education has been completed and the patient has no further questions. See below for any follow-up Occupational Therapy or equipment needs. OT to sign off. Thank you for referral.      Follow Up Recommendations  No OT follow up    Equipment Recommendations  None recommended by OT    Recommendations for Other Services       Precautions / Restrictions Precautions Precautions: Fall Restrictions Weight Bearing Restrictions: Yes Other Position/Activity Restrictions: LLE WBAT;maintain zero degree bone foam when out of CPM and in bed      Mobility Bed Mobility Overal bed mobility: Needs Assistance Bed Mobility: Supine to Sit;Sit to Supine     Supine to sit: Supervision;HOB elevated Sit to supine: Supervision;HOB elevated   General bed mobility comments: no assist, increased effort  Transfers Overall transfer level: Needs assistance Equipment used: Rolling walker (2 wheeled) Transfers: Sit to/from Stand Sit to Stand: Min guard         General transfer comment: cues for hand placement    Balance Overall balance assessment: Needs assistance Sitting-balance support: Feet supported Sitting balance-Leahy Scale: Good      Standing balance support: During functional activity;Bilateral upper extremity supported Standing balance-Leahy Scale: Good Standing balance comment: able to bend over to pick item up off ground without loss of balance                           ADL either performed or assessed with clinical judgement   ADL Overall ADL's : Needs assistance/impaired                                     Functional mobility during ADLs: Min guard;Rolling walker General ADL Comments: minguard for safety during functional mobility;pt completed LB ADL at [facility] level;discussed appropriate dressing strategies (dressing surgical leg first);pt overall supervision to minguard for ADL     Vision Patient Visual Report: No change from baseline       Perception     Praxis      Pertinent Vitals/Pain Pain Assessment: 0-10 Pain Score: 3  Pain Location: Lt knee Pain Descriptors / Indicators: Discomfort Pain Intervention(s): Limited activity within patient's tolerance;Monitored during session     Hand Dominance Right   Extremity/Trunk Assessment Upper Extremity Assessment Upper Extremity Assessment: Overall WFL for tasks assessed   Lower Extremity Assessment Lower Extremity Assessment: Defer to PT evaluation   Cervical / Trunk Assessment Cervical / Trunk Assessment: Normal   Communication Communication Communication: No difficulties   Cognition Arousal/Alertness: Awake/alert Behavior During Therapy: WFL for tasks assessed/performed Overall Cognitive Status: Within Functional Limits for tasks assessed  General Comments: good safety awareness    General Comments  vss;SpO2 >95% RA throughout session    Exercises     Shoulder Instructions      Home Living Family/patient expects to be discharged to:: Private residence Living Arrangements: Alone Available Help at Discharge: Family;Available 24 hours/day(pt's daughter will  stay for 2 weeks) Type of Home: House Home Access: Stairs to enter [employer] of Steps: pt has a little threshold at her door Entrance Stairs-Rails: None Home Layout: One level     Bathroom Shower/Tub: Teacher, early years/pre: Standard Bathroom Accessibility: Yes   Home Equipment: Environmental consultant - 2 wheels;Bedside commode;Grab bars - tub/shower;Cane - single point;Crutches          Prior Functioning/Environment Level of Independence: Independent                 OT Problem List: Decreased activity tolerance;Impaired balance (sitting and/or standing);Decreased knowledge of precautions;Pain      OT Treatment/Interventions:      OT Goals(Current goals can be found in the care plan section) Acute Rehab OT Goals Patient Stated Goal: to get back to walking for exercise OT Goal Formulation: With patient Time For Goal Achievement: 12/10/19 Potential to Achieve Goals: Good  OT Frequency:     Barriers to D/C:            Co-evaluation              AM-PAC OT "6 Clicks" Daily Activity     Outcome Measure Help from another person eating meals?: A Little Help from another person taking care of personal grooming?: A Little Help from another person toileting, which includes using toliet, bedpan, or urinal?: A Little Help from another person bathing (including washing, rinsing, drying)?: A Little Help from another person to put on and taking off regular upper body clothing?: A Little Help from another person to put on and taking off regular lower body clothing?: A Little 6 Click Score: 18   End of Session Equipment Utilized During Treatment: Gait belt;Rolling walker Nurse Communication: Mobility status  Activity Tolerance: Patient tolerated treatment well Patient left: with call bell/phone within reach;in chair;with chair alarm set  OT Visit Diagnosis: Unsteadiness on feet (R26.81);Other abnormalities of gait and mobility (R26.89);Pain Pain -  Right/Left: Left Pain - part of body: Knee                Time: [id number] OT Time Calculation (min): 16 min Charges:  OT General Charges $OT Visit: 1 Visit OT Evaluation $OT Eval Low Complexity: [place] OTR/L Acute Rehabilitation Services Office: [place] 11/26/2019, 10:26 AM

## 2019-11-28 ENCOUNTER — Encounter: Payer: Self-pay | Admitting: *Deleted

## 2022-01-29 DIAGNOSIS — E538 Deficiency of other specified B group vitamins: Secondary | ICD-10-CM | POA: Diagnosis not present

## 2022-01-30 DIAGNOSIS — E785 Hyperlipidemia, unspecified: Secondary | ICD-10-CM | POA: Diagnosis not present

## 2022-01-30 DIAGNOSIS — E039 Hypothyroidism, unspecified: Secondary | ICD-10-CM | POA: Diagnosis not present

## 2022-01-30 DIAGNOSIS — R7301 Impaired fasting glucose: Secondary | ICD-10-CM | POA: Diagnosis not present

## 2022-01-30 DIAGNOSIS — M859 Disorder of bone density and structure, unspecified: Secondary | ICD-10-CM | POA: Diagnosis not present

## 2022-01-30 DIAGNOSIS — I10 Essential (primary) hypertension: Secondary | ICD-10-CM | POA: Diagnosis not present

## 2022-02-05 DIAGNOSIS — Z0000 Encounter for general adult medical examination without abnormal findings: Secondary | ICD-10-CM | POA: Diagnosis not present

## 2022-02-05 DIAGNOSIS — R7301 Impaired fasting glucose: Secondary | ICD-10-CM | POA: Diagnosis not present

## 2022-02-05 DIAGNOSIS — D649 Anemia, unspecified: Secondary | ICD-10-CM | POA: Diagnosis not present

## 2022-02-05 DIAGNOSIS — N1832 Chronic kidney disease, stage 3b: Secondary | ICD-10-CM | POA: Diagnosis not present

## 2022-02-05 DIAGNOSIS — E611 Iron deficiency: Secondary | ICD-10-CM | POA: Diagnosis not present

## 2022-02-05 DIAGNOSIS — T8484XD Pain due to internal orthopedic prosthetic devices, implants and grafts, subsequent encounter: Secondary | ICD-10-CM | POA: Diagnosis not present

## 2022-02-05 DIAGNOSIS — E538 Deficiency of other specified B group vitamins: Secondary | ICD-10-CM | POA: Diagnosis not present

## 2022-02-05 DIAGNOSIS — M8580 Other specified disorders of bone density and structure, unspecified site: Secondary | ICD-10-CM | POA: Diagnosis not present

## 2022-02-05 DIAGNOSIS — R69 Illness, unspecified: Secondary | ICD-10-CM | POA: Diagnosis not present

## 2022-02-05 DIAGNOSIS — I129 Hypertensive chronic kidney disease with stage 1 through stage 4 chronic kidney disease, or unspecified chronic kidney disease: Secondary | ICD-10-CM | POA: Diagnosis not present

## 2022-02-05 DIAGNOSIS — Z1331 Encounter for screening for depression: Secondary | ICD-10-CM | POA: Diagnosis not present

## 2022-02-05 DIAGNOSIS — Z1339 Encounter for screening examination for other mental health and behavioral disorders: Secondary | ICD-10-CM | POA: Diagnosis not present

## 2022-02-05 DIAGNOSIS — E785 Hyperlipidemia, unspecified: Secondary | ICD-10-CM | POA: Diagnosis not present

## 2022-02-05 DIAGNOSIS — R82998 Other abnormal findings in urine: Secondary | ICD-10-CM | POA: Diagnosis not present

## 2022-02-19 DIAGNOSIS — K921 Melena: Secondary | ICD-10-CM | POA: Diagnosis not present

## 2023-01-13 DIAGNOSIS — Z0000 Encounter for general adult medical examination without abnormal findings: Secondary | ICD-10-CM | POA: Diagnosis not present

## 2023-01-13 DIAGNOSIS — I10 Essential (primary) hypertension: Secondary | ICD-10-CM | POA: Diagnosis not present

## 2023-01-13 DIAGNOSIS — E785 Hyperlipidemia, unspecified: Secondary | ICD-10-CM | POA: Diagnosis not present

## 2023-01-13 DIAGNOSIS — E611 Iron deficiency: Secondary | ICD-10-CM | POA: Diagnosis not present

## 2023-02-18 DIAGNOSIS — E611 Iron deficiency: Secondary | ICD-10-CM | POA: Diagnosis not present

## 2023-02-18 DIAGNOSIS — E785 Hyperlipidemia, unspecified: Secondary | ICD-10-CM | POA: Diagnosis not present

## 2023-02-18 DIAGNOSIS — E538 Deficiency of other specified B group vitamins: Secondary | ICD-10-CM | POA: Diagnosis not present

## 2023-02-18 DIAGNOSIS — R7301 Impaired fasting glucose: Secondary | ICD-10-CM | POA: Diagnosis not present

## 2023-02-18 DIAGNOSIS — N1832 Chronic kidney disease, stage 3b: Secondary | ICD-10-CM | POA: Diagnosis not present

## 2023-02-18 DIAGNOSIS — I10 Essential (primary) hypertension: Secondary | ICD-10-CM | POA: Diagnosis not present

## 2023-02-18 DIAGNOSIS — E039 Hypothyroidism, unspecified: Secondary | ICD-10-CM | POA: Diagnosis not present

## 2023-02-18 DIAGNOSIS — K219 Gastro-esophageal reflux disease without esophagitis: Secondary | ICD-10-CM | POA: Diagnosis not present

## 2023-02-25 DIAGNOSIS — E611 Iron deficiency: Secondary | ICD-10-CM | POA: Diagnosis not present

## 2023-02-25 DIAGNOSIS — E039 Hypothyroidism, unspecified: Secondary | ICD-10-CM | POA: Diagnosis not present

## 2023-02-25 DIAGNOSIS — Z1331 Encounter for screening for depression: Secondary | ICD-10-CM | POA: Diagnosis not present

## 2023-02-25 DIAGNOSIS — I10 Essential (primary) hypertension: Secondary | ICD-10-CM | POA: Diagnosis not present

## 2023-02-25 DIAGNOSIS — N1832 Chronic kidney disease, stage 3b: Secondary | ICD-10-CM | POA: Diagnosis not present

## 2023-02-25 DIAGNOSIS — F3342 Major depressive disorder, recurrent, in full remission: Secondary | ICD-10-CM | POA: Diagnosis not present

## 2023-02-25 DIAGNOSIS — K219 Gastro-esophageal reflux disease without esophagitis: Secondary | ICD-10-CM | POA: Diagnosis not present

## 2023-02-25 DIAGNOSIS — T8484XD Pain due to internal orthopedic prosthetic devices, implants and grafts, subsequent encounter: Secondary | ICD-10-CM | POA: Diagnosis not present

## 2023-02-25 DIAGNOSIS — Z1339 Encounter for screening examination for other mental health and behavioral disorders: Secondary | ICD-10-CM | POA: Diagnosis not present

## 2023-02-25 DIAGNOSIS — Z0000 Encounter for general adult medical examination without abnormal findings: Secondary | ICD-10-CM | POA: Diagnosis not present

## 2023-02-25 DIAGNOSIS — I129 Hypertensive chronic kidney disease with stage 1 through stage 4 chronic kidney disease, or unspecified chronic kidney disease: Secondary | ICD-10-CM | POA: Diagnosis not present

## 2023-02-25 DIAGNOSIS — R82998 Other abnormal findings in urine: Secondary | ICD-10-CM | POA: Diagnosis not present

## 2023-02-25 DIAGNOSIS — E538 Deficiency of other specified B group vitamins: Secondary | ICD-10-CM | POA: Diagnosis not present

## 2023-02-25 DIAGNOSIS — R195 Other fecal abnormalities: Secondary | ICD-10-CM | POA: Diagnosis not present

## 2023-02-25 DIAGNOSIS — M8580 Other specified disorders of bone density and structure, unspecified site: Secondary | ICD-10-CM | POA: Diagnosis not present

## 2023-02-25 DIAGNOSIS — D649 Anemia, unspecified: Secondary | ICD-10-CM | POA: Diagnosis not present

## 2023-03-03 ENCOUNTER — Other Ambulatory Visit (HOSPITAL_COMMUNITY): Payer: Self-pay | Admitting: *Deleted

## 2023-03-06 ENCOUNTER — Ambulatory Visit (HOSPITAL_COMMUNITY)
Admission: RE | Admit: 2023-03-06 | Discharge: 2023-03-06 | Disposition: A | Discharge status: Home / Self Care | Payer: Medicare HMO | Source: Ambulatory Visit | Attending: Internal Medicine | Admitting: Internal Medicine

## 2023-03-06 DIAGNOSIS — D649 Anemia, unspecified: Secondary | ICD-10-CM | POA: Insufficient documentation

## 2023-03-06 MED ORDER — SODIUM CHLORIDE 0.9 % IV SOLN
510.0000 mg | INTRAVENOUS | Status: DC
  Administered 2023-03-06: 510 mg via INTRAVENOUS
  Filled 2023-03-06: qty 510

## 2023-03-13 ENCOUNTER — Inpatient Hospital Stay (HOSPITAL_COMMUNITY): Admission: RE | Admit: 2023-03-13 | Payer: Medicare Other | Source: Ambulatory Visit

## 2023-03-19 ENCOUNTER — Ambulatory Visit (HOSPITAL_COMMUNITY)
Admission: RE | Admit: 2023-03-19 | Discharge: 2023-03-19 | Disposition: A | Discharge status: Home / Self Care | Payer: Medicare HMO | Source: Ambulatory Visit | Attending: Internal Medicine | Admitting: Internal Medicine

## 2023-03-19 DIAGNOSIS — D649 Anemia, unspecified: Secondary | ICD-10-CM | POA: Diagnosis not present

## 2023-03-19 MED ORDER — SODIUM CHLORIDE 0.9 % IV SOLN
510.0000 mg | INTRAVENOUS | Status: AC
  Administered 2023-03-19: 510 mg via INTRAVENOUS
  Filled 2023-03-19: qty 510

## 2023-05-07 DIAGNOSIS — E611 Iron deficiency: Secondary | ICD-10-CM | POA: Diagnosis not present

## 2023-05-07 DIAGNOSIS — D509 Iron deficiency anemia, unspecified: Secondary | ICD-10-CM | POA: Diagnosis not present

## 2023-05-07 DIAGNOSIS — E039 Hypothyroidism, unspecified: Secondary | ICD-10-CM | POA: Diagnosis not present

## 2023-05-07 DIAGNOSIS — K219 Gastro-esophageal reflux disease without esophagitis: Secondary | ICD-10-CM | POA: Diagnosis not present

## 2023-05-07 DIAGNOSIS — I10 Essential (primary) hypertension: Secondary | ICD-10-CM | POA: Diagnosis not present

## 2023-05-07 DIAGNOSIS — R7989 Other specified abnormal findings of blood chemistry: Secondary | ICD-10-CM | POA: Diagnosis not present

## 2024-02-26 ENCOUNTER — Emergency Department (HOSPITAL_COMMUNITY)

## 2024-02-26 ENCOUNTER — Other Ambulatory Visit: Payer: Self-pay

## 2024-02-26 ENCOUNTER — Encounter (HOSPITAL_COMMUNITY): Payer: Self-pay

## 2024-02-26 ENCOUNTER — Inpatient Hospital Stay (HOSPITAL_COMMUNITY)
Admission: EM | Admit: 2024-02-26 | Discharge: 2024-03-04 | DRG: 917 | Discharge status: Home / Self Care | Attending: Internal Medicine | Admitting: Internal Medicine

## 2024-02-26 DIAGNOSIS — Z043 Encounter for examination and observation following other accident: Secondary | ICD-10-CM | POA: Diagnosis not present

## 2024-02-26 DIAGNOSIS — J45909 Unspecified asthma, uncomplicated: Secondary | ICD-10-CM | POA: Diagnosis present

## 2024-02-26 DIAGNOSIS — R569 Unspecified convulsions: Secondary | ICD-10-CM

## 2024-02-26 DIAGNOSIS — F322 Major depressive disorder, single episode, severe without psychotic features: Secondary | ICD-10-CM | POA: Diagnosis not present

## 2024-02-26 DIAGNOSIS — R918 Other nonspecific abnormal finding of lung field: Secondary | ICD-10-CM | POA: Diagnosis not present

## 2024-02-26 DIAGNOSIS — D509 Iron deficiency anemia, unspecified: Secondary | ICD-10-CM | POA: Diagnosis present

## 2024-02-26 DIAGNOSIS — T50902A Poisoning by unspecified drugs, medicaments and biological substances, intentional self-harm, initial encounter: Secondary | ICD-10-CM | POA: Diagnosis not present

## 2024-02-26 DIAGNOSIS — Z809 Family history of malignant neoplasm, unspecified: Secondary | ICD-10-CM

## 2024-02-26 DIAGNOSIS — Z79890 Hormone replacement therapy (postmenopausal): Secondary | ICD-10-CM | POA: Diagnosis not present

## 2024-02-26 DIAGNOSIS — R739 Hyperglycemia, unspecified: Secondary | ICD-10-CM | POA: Diagnosis not present

## 2024-02-26 DIAGNOSIS — R571 Hypovolemic shock: Secondary | ICD-10-CM | POA: Diagnosis not present

## 2024-02-26 DIAGNOSIS — K449 Diaphragmatic hernia without obstruction or gangrene: Secondary | ICD-10-CM | POA: Diagnosis not present

## 2024-02-26 DIAGNOSIS — Z79899 Other long term (current) drug therapy: Secondary | ICD-10-CM

## 2024-02-26 DIAGNOSIS — Z5986 Financial insecurity: Secondary | ICD-10-CM

## 2024-02-26 DIAGNOSIS — K920 Hematemesis: Secondary | ICD-10-CM | POA: Diagnosis not present

## 2024-02-26 DIAGNOSIS — R0902 Hypoxemia: Secondary | ICD-10-CM | POA: Diagnosis not present

## 2024-02-26 DIAGNOSIS — F05 Delirium due to known physiological condition: Secondary | ICD-10-CM | POA: Diagnosis not present

## 2024-02-26 DIAGNOSIS — J9601 Acute respiratory failure with hypoxia: Secondary | ICD-10-CM | POA: Diagnosis not present

## 2024-02-26 DIAGNOSIS — R680 Hypothermia, not associated with low environmental temperature: Secondary | ICD-10-CM | POA: Diagnosis present

## 2024-02-26 DIAGNOSIS — E039 Hypothyroidism, unspecified: Secondary | ICD-10-CM | POA: Diagnosis not present

## 2024-02-26 DIAGNOSIS — M8580 Other specified disorders of bone density and structure, unspecified site: Secondary | ICD-10-CM | POA: Diagnosis present

## 2024-02-26 DIAGNOSIS — G9341 Metabolic encephalopathy: Secondary | ICD-10-CM

## 2024-02-26 DIAGNOSIS — Z800 Family history of malignant neoplasm of digestive organs: Secondary | ICD-10-CM

## 2024-02-26 DIAGNOSIS — I130 Hypertensive heart and chronic kidney disease with heart failure and stage 1 through stage 4 chronic kidney disease, or unspecified chronic kidney disease: Secondary | ICD-10-CM | POA: Diagnosis not present

## 2024-02-26 DIAGNOSIS — R339 Retention of urine, unspecified: Secondary | ICD-10-CM | POA: Diagnosis present

## 2024-02-26 DIAGNOSIS — M25512 Pain in left shoulder: Secondary | ICD-10-CM | POA: Diagnosis not present

## 2024-02-26 DIAGNOSIS — F411 Generalized anxiety disorder: Secondary | ICD-10-CM | POA: Diagnosis not present

## 2024-02-26 DIAGNOSIS — E1165 Type 2 diabetes mellitus with hyperglycemia: Secondary | ICD-10-CM | POA: Diagnosis present

## 2024-02-26 DIAGNOSIS — E876 Hypokalemia: Secondary | ICD-10-CM | POA: Diagnosis present

## 2024-02-26 DIAGNOSIS — R4182 Altered mental status, unspecified: Secondary | ICD-10-CM | POA: Diagnosis not present

## 2024-02-26 DIAGNOSIS — M160 Bilateral primary osteoarthritis of hip: Secondary | ICD-10-CM | POA: Diagnosis not present

## 2024-02-26 DIAGNOSIS — J690 Pneumonitis due to inhalation of food and vomit: Secondary | ICD-10-CM | POA: Diagnosis present

## 2024-02-26 DIAGNOSIS — R404 Transient alteration of awareness: Secondary | ICD-10-CM | POA: Diagnosis not present

## 2024-02-26 DIAGNOSIS — Z9151 Personal history of suicidal behavior: Secondary | ICD-10-CM

## 2024-02-26 DIAGNOSIS — K219 Gastro-esophageal reflux disease without esophagitis: Secondary | ICD-10-CM | POA: Diagnosis present

## 2024-02-26 DIAGNOSIS — G8929 Other chronic pain: Secondary | ICD-10-CM | POA: Diagnosis not present

## 2024-02-26 DIAGNOSIS — Z888 Allergy status to other drugs, medicaments and biological substances status: Secondary | ICD-10-CM

## 2024-02-26 DIAGNOSIS — N1832 Chronic kidney disease, stage 3b: Secondary | ICD-10-CM | POA: Diagnosis present

## 2024-02-26 DIAGNOSIS — G9340 Encephalopathy, unspecified: Secondary | ICD-10-CM | POA: Diagnosis present

## 2024-02-26 DIAGNOSIS — E785 Hyperlipidemia, unspecified: Secondary | ICD-10-CM | POA: Diagnosis not present

## 2024-02-26 DIAGNOSIS — E1122 Type 2 diabetes mellitus with diabetic chronic kidney disease: Secondary | ICD-10-CM | POA: Diagnosis present

## 2024-02-26 DIAGNOSIS — I10 Essential (primary) hypertension: Secondary | ICD-10-CM | POA: Diagnosis present

## 2024-02-26 DIAGNOSIS — I517 Cardiomegaly: Secondary | ICD-10-CM | POA: Diagnosis not present

## 2024-02-26 DIAGNOSIS — G928 Other toxic encephalopathy: Secondary | ICD-10-CM | POA: Diagnosis not present

## 2024-02-26 DIAGNOSIS — Z87891 Personal history of nicotine dependence: Secondary | ICD-10-CM

## 2024-02-26 DIAGNOSIS — I5033 Acute on chronic diastolic (congestive) heart failure: Secondary | ICD-10-CM | POA: Diagnosis present

## 2024-02-26 DIAGNOSIS — R29818 Other symptoms and signs involving the nervous system: Secondary | ICD-10-CM | POA: Diagnosis not present

## 2024-02-26 DIAGNOSIS — I447 Left bundle-branch block, unspecified: Secondary | ICD-10-CM | POA: Diagnosis present

## 2024-02-26 DIAGNOSIS — M6282 Rhabdomyolysis: Secondary | ICD-10-CM | POA: Insufficient documentation

## 2024-02-26 DIAGNOSIS — J189 Pneumonia, unspecified organism: Secondary | ICD-10-CM | POA: Diagnosis not present

## 2024-02-26 DIAGNOSIS — F988 Other specified behavioral and emotional disorders with onset usually occurring in childhood and adolescence: Secondary | ICD-10-CM | POA: Diagnosis present

## 2024-02-26 DIAGNOSIS — Z96652 Presence of left artificial knee joint: Secondary | ICD-10-CM | POA: Diagnosis present

## 2024-02-26 DIAGNOSIS — R579 Shock, unspecified: Secondary | ICD-10-CM | POA: Diagnosis not present

## 2024-02-26 DIAGNOSIS — R578 Other shock: Secondary | ICD-10-CM | POA: Diagnosis not present

## 2024-02-26 DIAGNOSIS — Z66 Do not resuscitate: Secondary | ICD-10-CM | POA: Diagnosis not present

## 2024-02-26 DIAGNOSIS — G500 Trigeminal neuralgia: Secondary | ICD-10-CM | POA: Diagnosis present

## 2024-02-26 DIAGNOSIS — J80 Acute respiratory distress syndrome: Secondary | ICD-10-CM | POA: Diagnosis not present

## 2024-02-26 DIAGNOSIS — Z823 Family history of stroke: Secondary | ICD-10-CM

## 2024-02-26 DIAGNOSIS — R531 Weakness: Secondary | ICD-10-CM | POA: Diagnosis not present

## 2024-02-26 DIAGNOSIS — T1491XA Suicide attempt, initial encounter: Secondary | ICD-10-CM | POA: Insufficient documentation

## 2024-02-26 DIAGNOSIS — J9600 Acute respiratory failure, unspecified whether with hypoxia or hypercapnia: Secondary | ICD-10-CM | POA: Diagnosis not present

## 2024-02-26 DIAGNOSIS — T68XXXA Hypothermia, initial encounter: Secondary | ICD-10-CM

## 2024-02-26 DIAGNOSIS — F419 Anxiety disorder, unspecified: Secondary | ICD-10-CM | POA: Diagnosis present

## 2024-02-26 DIAGNOSIS — Z8249 Family history of ischemic heart disease and other diseases of the circulatory system: Secondary | ICD-10-CM

## 2024-02-26 DIAGNOSIS — R0602 Shortness of breath: Secondary | ICD-10-CM | POA: Diagnosis not present

## 2024-02-26 DIAGNOSIS — J984 Other disorders of lung: Secondary | ICD-10-CM | POA: Diagnosis not present

## 2024-02-26 LAB — URINALYSIS, W/ REFLEX TO CULTURE (INFECTION SUSPECTED)
Bacteria, UA: NONE SEEN
Bilirubin Urine: NEGATIVE
Glucose, UA: NEGATIVE mg/dL
Ketones, ur: NEGATIVE mg/dL
Leukocytes,Ua: NEGATIVE
Nitrite: NEGATIVE
Protein, ur: 100 mg/dL — AB
Specific Gravity, Urine: >1.046 — ABNORMAL HIGH (ref 1.005–1.030)
pH: 5 (ref 5.0–8.0)

## 2024-02-26 LAB — LACTIC ACID, PLASMA
Lactic Acid, Venous: 3.2 mmol/L (ref 0.5–1.9)
Lactic Acid, Venous: 2 mmol/L (ref 0.5–1.9)

## 2024-02-26 LAB — CBC
HCT: 31.2 % — ABNORMAL LOW (ref 36.0–46.0)
HCT: 35.7 % — ABNORMAL LOW (ref 36.0–46.0)
Hemoglobin: 10.1 g/dL — ABNORMAL LOW (ref 12.0–15.0)
Hemoglobin: 11.5 g/dL — ABNORMAL LOW (ref 12.0–15.0)
MCH: 29.3 pg (ref 26.0–34.0)
MCH: 28.8 pg (ref 26.0–34.0)
MCHC: 32.4 g/dL (ref 30.0–36.0)
MCHC: 32.2 g/dL (ref 30.0–36.0)
MCV: 90.4 fL (ref 80.0–100.0)
MCV: 89.5 fL (ref 80.0–100.0)
Platelets: 172 10*3/uL (ref 150–400)
Platelets: 203 10*3/uL (ref 150–400)
RBC: 3.45 MIL/uL — ABNORMAL LOW (ref 3.87–5.11)
RBC: 3.99 MIL/uL (ref 3.87–5.11)
RDW: 13.3 % (ref 11.5–15.5)
RDW: 13.3 % (ref 11.5–15.5)
WBC: 3.7 10*3/uL — ABNORMAL LOW (ref 4.0–10.5)
WBC: 5.6 10*3/uL (ref 4.0–10.5)
nRBC: 0 % (ref 0.0–0.2)
nRBC: 0 % (ref 0.0–0.2)

## 2024-02-26 LAB — RAPID URINE DRUG SCREEN, HOSP PERFORMED
Amphetamines: POSITIVE — AB
Barbiturates: NOT DETECTED
Benzodiazepines: POSITIVE — AB
Cocaine: NOT DETECTED
Opiates: POSITIVE — AB
Tetrahydrocannabinol: POSITIVE — AB

## 2024-02-26 LAB — DIFFERENTIAL
Abs Immature Granulocytes: 0.01 10*3/uL (ref 0.00–0.07)
Basophils Absolute: 0 10*3/uL (ref 0.0–0.1)
Basophils Relative: 0 %
Eosinophils Absolute: 0 10*3/uL (ref 0.0–0.5)
Eosinophils Relative: 0 %
Immature Granulocytes: 0 %
Lymphocytes Relative: 17 %
Lymphs Abs: 0.6 10*3/uL — ABNORMAL LOW (ref 0.7–4.0)
Monocytes Absolute: 0.1 10*3/uL (ref 0.1–1.0)
Monocytes Relative: 2 %
Neutro Abs: 2.9 10*3/uL (ref 1.7–7.7)
Neutrophils Relative %: 81 %

## 2024-02-26 LAB — I-STAT CHEM 8, ED
BUN: 26 mg/dL — ABNORMAL HIGH (ref 8–23)
Calcium, Ion: 1.05 mmol/L — ABNORMAL LOW (ref 1.15–1.40)
Chloride: 102 mmol/L (ref 98–111)
Creatinine, Ser: 1.7 mg/dL — ABNORMAL HIGH (ref 0.44–1.00)
Glucose, Bld: 198 mg/dL — ABNORMAL HIGH (ref 70–99)
HCT: 38 % (ref 36.0–46.0)
Hemoglobin: 12.9 g/dL (ref 12.0–15.0)
Potassium: 3 mmol/L — ABNORMAL LOW (ref 3.5–5.1)
Sodium: 138 mmol/L (ref 135–145)
TCO2: 26 mmol/L (ref 22–32)

## 2024-02-26 LAB — COMPREHENSIVE METABOLIC PANEL WITH GFR
ALT: 13 U/L (ref 0–44)
AST: 21 U/L (ref 15–41)
Albumin: 3 g/dL — ABNORMAL LOW (ref 3.5–5.0)
Alkaline Phosphatase: 54 U/L (ref 38–126)
Anion gap: 12 (ref 5–15)
BUN: 22 mg/dL (ref 8–23)
CO2: 24 mmol/L (ref 22–32)
Calcium: 8.1 mg/dL — ABNORMAL LOW (ref 8.9–10.3)
Chloride: 101 mmol/L (ref 98–111)
Creatinine, Ser: 1.57 mg/dL — ABNORMAL HIGH (ref 0.44–1.00)
GFR, Estimated: 33 mL/min — ABNORMAL LOW (ref >60)
Glucose, Bld: 201 mg/dL — ABNORMAL HIGH (ref 70–99)
Potassium: 3.1 mmol/L — ABNORMAL LOW (ref 3.5–5.1)
Sodium: 137 mmol/L (ref 135–145)
Total Bilirubin: 0.6 mg/dL (ref 0.0–1.2)
Total Protein: 5.3 g/dL — ABNORMAL LOW (ref 6.5–8.1)

## 2024-02-26 LAB — GLUCOSE, CAPILLARY
Glucose-Capillary: 95 mg/dL (ref 70–99)
Glucose-Capillary: 73 mg/dL (ref 70–99)
Glucose-Capillary: 89 mg/dL (ref 70–99)

## 2024-02-26 LAB — I-STAT ARTERIAL BLOOD GAS, ED
Acid-base deficit: 3 mmol/L — ABNORMAL HIGH (ref 0.0–2.0)
Bicarbonate: 24.8 mmol/L (ref 20.0–28.0)
Calcium, Ion: 1.15 mmol/L (ref 1.15–1.40)
HCT: 28 % — ABNORMAL LOW (ref 36.0–46.0)
Hemoglobin: 9.5 g/dL — ABNORMAL LOW (ref 12.0–15.0)
O2 Saturation: 99 %
Patient temperature: 85.2
Potassium: 3.1 mmol/L — ABNORMAL LOW (ref 3.5–5.1)
Sodium: 136 mmol/L (ref 135–145)
TCO2: 27 mmol/L (ref 22–32)
pCO2 arterial: 42.5 mmHg (ref 32–48)
pH, Arterial: 7.333 — ABNORMAL LOW (ref 7.35–7.45)
pO2, Arterial: 140 mmHg — ABNORMAL HIGH (ref 83–108)

## 2024-02-26 LAB — APTT: aPTT: 30 s (ref 24–36)

## 2024-02-26 LAB — TROPONIN I (HIGH SENSITIVITY)
Troponin I (High Sensitivity): 4 ng/L (ref <18)
Troponin I (High Sensitivity): 7 ng/L (ref <18)

## 2024-02-26 LAB — BASIC METABOLIC PANEL WITH GFR
Anion gap: 12 (ref 5–15)
BUN: 22 mg/dL (ref 8–23)
CO2: 22 mmol/L (ref 22–32)
Calcium: 7.9 mg/dL — ABNORMAL LOW (ref 8.9–10.3)
Chloride: 105 mmol/L (ref 98–111)
Creatinine, Ser: 1.36 mg/dL — ABNORMAL HIGH (ref 0.44–1.00)
GFR, Estimated: 39 mL/min — ABNORMAL LOW (ref >60)
Glucose, Bld: 144 mg/dL — ABNORMAL HIGH (ref 70–99)
Potassium: 3.1 mmol/L — ABNORMAL LOW (ref 3.5–5.1)
Sodium: 139 mmol/L (ref 135–145)

## 2024-02-26 LAB — PROTIME-INR
INR: 1.2 (ref 0.8–1.2)
Prothrombin Time: 15.4 s — ABNORMAL HIGH (ref 11.4–15.2)

## 2024-02-26 LAB — TYPE AND SCREEN
ABO/RH(D): O POS
Antibody Screen: NEGATIVE

## 2024-02-26 LAB — CK
Total CK: 781 U/L — ABNORMAL HIGH (ref 38–234)
Total CK: 5615 U/L — ABNORMAL HIGH (ref 38–234)

## 2024-02-26 LAB — HEMOGLOBIN A1C
Hgb A1c MFr Bld: 5.5 % (ref 4.8–5.6)
Mean Plasma Glucose: 111.15 mg/dL

## 2024-02-26 LAB — CORTISOL: Cortisol, Plasma: 34.4 ug/dL

## 2024-02-26 LAB — T4, FREE: Free T4: 1.47 ng/dL — ABNORMAL HIGH (ref 0.61–1.12)

## 2024-02-26 LAB — I-STAT CG4 LACTIC ACID, ED: Lactic Acid, Venous: 2 mmol/L (ref 0.5–1.9)

## 2024-02-26 LAB — SALICYLATE LEVEL: Salicylate Lvl: <7 mg/dL — ABNORMAL LOW (ref 7.0–30.0)

## 2024-02-26 LAB — AMMONIA: Ammonia: 36 umol/L — ABNORMAL HIGH (ref 9–35)

## 2024-02-26 LAB — MRSA NEXT GEN BY PCR, NASAL: MRSA by PCR Next Gen: DETECTED — AB

## 2024-02-26 LAB — ETHANOL: Alcohol, Ethyl (B): <10 mg/dL (ref <10)

## 2024-02-26 LAB — HEMOGLOBIN AND HEMATOCRIT, BLOOD
HCT: 33.2 % — ABNORMAL LOW (ref 36.0–46.0)
Hemoglobin: 10.6 g/dL — ABNORMAL LOW (ref 12.0–15.0)

## 2024-02-26 LAB — CBG MONITORING, ED: Glucose-Capillary: 201 mg/dL — ABNORMAL HIGH (ref 70–99)

## 2024-02-26 LAB — TSH: TSH: 0.705 u[IU]/mL (ref 0.350–4.500)

## 2024-02-26 LAB — ACETAMINOPHEN LEVEL: Acetaminophen (Tylenol), Serum: <10 ug/mL — ABNORMAL LOW (ref 10–30)

## 2024-02-26 MED ORDER — HALOPERIDOL LACTATE 5 MG/ML IJ SOLN
5.0000 mg | Freq: Once | INTRAMUSCULAR | Status: AC
  Administered 2024-02-26: 5 mg via INTRAVENOUS

## 2024-02-26 MED ORDER — POTASSIUM CHLORIDE 10 MEQ/100ML IV SOLN
10.0000 meq | INTRAVENOUS | Status: AC
  Administered 2024-02-26 (×4): 10 meq via INTRAVENOUS
  Filled 2024-02-26 (×4): qty 100

## 2024-02-26 MED ORDER — SODIUM CHLORIDE 0.9 % IV SOLN
250.0000 mL | INTRAVENOUS | Status: AC
  Administered 2024-02-27: 250 mL via INTRAVENOUS

## 2024-02-26 MED ORDER — SODIUM CHLORIDE 0.9 % IV BOLUS (SEPSIS)
1000.0000 mL | Freq: Once | INTRAVENOUS | Status: AC
  Administered 2024-02-26: 1000 mL via INTRAVENOUS

## 2024-02-26 MED ORDER — LEVOTHYROXINE SODIUM 88 MCG PO TABS
88.0000 ug | ORAL_TABLET | Freq: Every day | ORAL | Status: DC
  Filled 2024-02-26: qty 1

## 2024-02-26 MED ORDER — METRONIDAZOLE 500 MG/100ML IV SOLN
500.0000 mg | Freq: Once | INTRAVENOUS | Status: AC
  Administered 2024-02-26: 500 mg via INTRAVENOUS
  Filled 2024-02-26: qty 100

## 2024-02-26 MED ORDER — IOHEXOL 350 MG/ML SOLN
100.0000 mL | Freq: Once | INTRAVENOUS | Status: AC | PRN
  Administered 2024-02-26: 100 mL via INTRAVENOUS

## 2024-02-26 MED ORDER — NOREPINEPHRINE 4 MG/250ML-% IV SOLN: 4.0000 ug/min | INTRAVENOUS | Status: DC

## 2024-02-26 MED ORDER — VANCOMYCIN HCL IN DEXTROSE 1-5 GM/200ML-% IV SOLN
1000.0000 mg | Freq: Once | INTRAVENOUS | Status: AC
  Administered 2024-02-26: 1000 mg via INTRAVENOUS
  Filled 2024-02-26: qty 200

## 2024-02-26 MED ORDER — LACTATED RINGERS IV SOLN: INTRAVENOUS | Status: DC

## 2024-02-26 MED ORDER — MUPIROCIN 2 % EX OINT
1.0000 | TOPICAL_OINTMENT | Freq: Two times a day (BID) | CUTANEOUS | Status: AC
  Administered 2024-02-26 – 2024-03-01 (×8): 1 via NASAL
  Filled 2024-02-26 (×4): qty 22

## 2024-02-26 MED ORDER — SODIUM CHLORIDE 0.9 % IV SOLN
2.0000 g | Freq: Once | INTRAVENOUS | Status: AC
  Administered 2024-02-26: 2 g via INTRAVENOUS

## 2024-02-26 MED ORDER — FLUMAZENIL 0.5 MG/5ML IV SOLN
0.2000 mg | Freq: Once | INTRAVENOUS | Status: DC
Start: 2024-02-26 — End: 2024-02-26
  Filled 2024-02-26: qty 5

## 2024-02-26 MED ORDER — PHENYLEPHRINE 80 MCG/ML (10ML) SYRINGE FOR IV PUSH (FOR BLOOD PRESSURE SUPPORT)
160.0000 ug | PREFILLED_SYRINGE | Freq: Once | INTRAVENOUS | Status: AC | PRN
  Administered 2024-02-26: 160 ug via INTRAVENOUS

## 2024-02-26 MED ORDER — METRONIDAZOLE 500 MG/100ML IV SOLN: 500.0000 mg | Freq: Two times a day (BID) | INTRAVENOUS | Status: DC

## 2024-02-26 MED ORDER — CHLORHEXIDINE GLUCONATE CLOTH 2 % EX PADS
6.0000 | MEDICATED_PAD | Freq: Every day | CUTANEOUS | Status: DC
  Administered 2024-02-26 – 2024-03-04 (×8): 6 via TOPICAL

## 2024-02-26 MED ORDER — NALOXONE HCL 4 MG/10ML IJ SOLN
1.5000 mg/h | INTRAVENOUS | Status: DC
  Filled 2024-02-26: qty 10

## 2024-02-26 MED ORDER — NALOXONE HCL 2 MG/2ML IJ SOSY
PREFILLED_SYRINGE | INTRAMUSCULAR | Status: AC
  Administered 2024-02-26: 2 mg
  Filled 2024-02-26: qty 2

## 2024-02-26 MED ORDER — METRONIDAZOLE 500 MG/100ML IV SOLN
500.0000 mg | Freq: Two times a day (BID) | INTRAVENOUS | Status: DC
  Administered 2024-02-27: 500 mg via INTRAVENOUS
  Filled 2024-02-26: qty 100

## 2024-02-26 MED ORDER — NOREPINEPHRINE 4 MG/250ML-% IV SOLN: 0.0000 ug/min | INTRAVENOUS | Status: DC

## 2024-02-26 MED ORDER — PANTOPRAZOLE SODIUM 40 MG IV SOLR
40.0000 mg | Freq: Two times a day (BID) | INTRAVENOUS | Status: DC
  Administered 2024-02-26 – 2024-02-28 (×4): 40 mg via INTRAVENOUS
  Filled 2024-02-26 (×4): qty 10

## 2024-02-26 MED ORDER — SODIUM CHLORIDE 0.9 % IV BOLUS (SEPSIS)
250.0000 mL | Freq: Once | INTRAVENOUS | Status: AC
  Administered 2024-02-26: 250 mL via INTRAVENOUS

## 2024-02-26 MED ORDER — FLUMAZENIL 0.5 MG/5ML IV SOLN
0.4000 mg | Freq: Once | INTRAVENOUS | Status: AC
  Administered 2024-02-26: 0.4 mg via INTRAVENOUS

## 2024-02-26 MED ORDER — SODIUM CHLORIDE 0.9 % IV SOLN
2.0000 g | INTRAVENOUS | Status: DC
  Administered 2024-02-26 – 2024-02-29 (×4): 2 g via INTRAVENOUS
  Filled 2024-02-26 (×4): qty 20

## 2024-02-26 MED ORDER — INSULIN ASPART 100 UNIT/ML IJ SOLN
0.0000 [IU] | INTRAMUSCULAR | Status: DC
Start: 2024-02-26 — End: 2024-02-28

## 2024-02-26 MED ORDER — NOREPINEPHRINE 4 MG/250ML-% IV SOLN
INTRAVENOUS | Status: AC
  Administered 2024-02-26: 4 ug/min via INTRAVENOUS
  Filled 2024-02-26: qty 250

## 2024-02-26 MED ORDER — NALOXONE HCL 0.4 MG/ML IJ SOLN
INTRAMUSCULAR | Status: AC
  Filled 2024-02-26: qty 1

## 2024-02-26 NOTE — H&P (Signed)
 NAME:  Valerie Davis, MRN:  [id number], DOB:  18-Aug-1941, LOS: 0 ADMISSION DATE:  02/26/2024, CONSULTATION DATE:  4/11 REFERRING MD:  Vonita Moss, CHIEF COMPLAINT:  shock and AMS   History of Present Illness:  83 year old female with history as mentioned below in her usual state of health up until about a week prior to presentation when she did have 2 to 3 days of abdominal discomfort and diarrhea.  Per family he was felt that this had resolved, she had returned to her typical state of health with the exception of having some hypertension with systolic blood pressure in the 200s on 4/10.  She had spoken with her family and the plan was to reach out to her primary care doctor about this on 4/11.  The patient's son called her on 4/11 and she did not answer the phone, this subsequently led to a wellness check, police went to the patient's house, accompanied now by the daughter who let the police in where they found the patient lying on the floor minimally responsive. She was administered Narcan by EMS She was hypothermic on arrival There was question of left-sided weakness and because of this a code stroke was called.  Of note he had dried blood surrounding her mouth, on her tongue, and in her nose.  CT angiogram was negative for large vessel occlusion, total CKs were elevated in the 700s then raising the question of possible overdose versus seizure she was additionally administered flumazenil, there has been no significant response to this.  An EEG was obtained that showed excessive beta activity with generalized slowing but no epileptiform discharges.  The patient remained hypotensive in the emergency room in spite of 3 L of crystalloid, because of her hypotension she was started on norepinephrine infusion.  Pulmonary was asked to admit for shock and altered mental status Pertinent  Medical History  Htn, hypothyroidism, feso4 def anemia, gerd w/out  esophagitis, ckd stage 3b, depression, prior  GIB  Significant Hospital Events: Including procedures, antibiotic start and stop dates in addition to other pertinent events   4/11 admitted with altered mental status, etiology not clear, question postictal state versus overdose versus metabolic derangements.  Started on broad-spectrum antibiotics, culture sent.  PPI twice daily started.  DNR/DNI status confirmed  Interim History / Subjective:  Now more awake oriented x 2  Objective   Blood pressure 104/67, pulse 73, temperature 98.5 F (36.9 C), temperature source Rectal, resp. rate 12, SpO2 91%.        Intake/Output Summary (Last 24 hours) at 02/26/2024 1610 Last data filed at 02/26/2024 1538 Gross per 24 hour  Intake 2450.33 ml  Output --  Net 2450.33 ml   There were no vitals filed for this visit.  Examination: General: 83 year old female laying in bed speech is soft, but appropriate.  She is in no acute distress currently HENT: She has dried blood in her mouth, around her mouth, and in both naris.  She is not able to recall if she vomited there is no trauma or bite marks on the tongue Lungs: Decreased bases no accessory use currently 4 L/min Cardiovascular: Regular rate and rhythm Abdomen: Soft not tender Extremities: Strong pulses no edema brisk cap refill Neuro: Slow to arouse but oriented x 2-3 mental status has been improving over course of ER visit moves all extremities GU: Clear yellow  Resolved Hospital Problem list     Assessment & Plan:  Acute metabolic encephalopathy of unknown clear etiology.  She has a history  of hypothyroidism however free T4 is therapeutic so even if TSH was elevated seems unlikely the source.  Additional considerations would be possible postictal state, unintentional overdose, or simply hypoperfusion and sepsis Plan Admitting to the intensive care Going to hold off from Narcan infusion, she is waking up independently, do not want to precipitate any vomiting Serial neurochecks Hold  sedating medications Continue supportive care  Undifferentiated shock -Etiology not clear.  Has not really responded to volume resuscitation efforts.  Surprisingly with her hypotension she is not tachycardic, GI bleed with ineffective compensatory response certainly a consideration  Plan Continue maintenance IV fluids Hold her ARB/diuretic regimen Send blood cultures Repeat CBC, initial hemoglobin was 10, however suspect that this reflects a hemoconcentrated reading  Hypokalemia Plan Replace and recheck  History of hypothyroidism Plan F/u tsh Cont home rx  At risk for AKI.  Has history of CKD stage IIIb Plan Serial chemistries Strict intake output Renal dose medications  Hematemesis Plan N.p.o. PPI twice daily Follow-up CBC  Hyperglycemia Plan Sliding scale insulin sensitive scale Glucose 140-180 Best Practice (right click and "Reselect all SmartList Selections" daily)   Diet/type: NPO w/ oral meds DVT prophylaxis SCD Pressure ulcer(s): N/A GI prophylaxis: PPI Lines: N/A Foley:  Yes, and it is still needed Code Status:  DNR Last date of multidisciplinary goals of care discussion [wants medical care ]  Labs   CBC: Recent Labs  Lab 02/26/24 1308 02/26/24 1345 02/26/24 1350 02/26/24 1535  WBC  --  3.7*  --   --   NEUTROABS  --  2.9  --   --   HGB 12.9 10.1* 9.5* 10.6*  HCT 38.0 31.2* 28.0* 33.2*  MCV  --  90.4  --   --   PLT  --  172  --   --     Basic Metabolic Panel: Recent Labs  Lab 02/26/24 1308 02/26/24 1345 02/26/24 1350  NA 138 137 136  K 3.0* 3.1* 3.1*  CL 102 101  --   CO2  --  24  --   GLUCOSE 198* 201*  --   BUN 26* 22  --   CREATININE 1.70* 1.57*  --   CALCIUM  --  8.1*  --    GFR: CrCl cannot be calculated (Unknown ideal weight.). Recent Labs  Lab 02/26/24 1345 02/26/24 1355 02/26/24 1535  WBC 3.7*  --   --   LATICACIDVEN  --  3.2* 2.0*    Liver Function Tests: Recent Labs  Lab 02/26/24 1345  AST 21  ALT 13   ALKPHOS 54  BILITOT 0.6  PROT 5.3*  ALBUMIN 3.0*   No results for input(s): "LIPASE", "AMYLASE" in the last 168 hours. No results for input(s): "AMMONIA" in the last 168 hours.  ABG    Component Value Date/Time   PHART 7.333 (L) 02/26/2024 1350   PCO2ART 42.5 02/26/2024 1350   PO2ART 140 (H) 02/26/2024 1350   HCO3 24.8 02/26/2024 1350   TCO2 27 02/26/2024 1350   ACIDBASEDEF 3.0 (H) 02/26/2024 1350   O2SAT 99 02/26/2024 1350     Coagulation Profile: Recent Labs  Lab 02/26/24 1345  INR 1.2    Cardiac Enzymes: Recent Labs  Lab 02/26/24 1345  CKTOTAL 781*    HbA1C: No results found for: "HGBA1C"  CBG: Recent Labs  Lab 02/26/24 1249  GLUCAP 201*    Review of Systems:   Not able   Past Medical History:  She,  has a past medical history of ADD (  attention deficit disorder), Arthritis, Asthma, Blood transfusion without reported diagnosis, Depression, Diverticulosis, GERD (gastroesophageal reflux disease), H/O: GI bleed, Hypertension, Hypothyroidism, Insomnia, Iron deficiency anemia, Osteopenia, Personal history of colonic adenoma (07/06/2013), Pneumonia, and Trigeminal neuralgia.   Surgical History:   Past Surgical History:  Procedure Laterality Date   ABDOMINAL HYSTERECTOMY  age 86   BILATERAL SALPINGOOPHORECTOMY     COLONOSCOPY     HIATAL HERNIA REPAIR N/A 08/30/2013   Procedure: LAPAROSCOPIC REPAIR OF HIATAL HERNIA;  Surgeon: Valarie Merino, MD;  Location: [facility];  Service: General;  Laterality: N/A;  With MESH   knee menscectomy Right 07/2009   TOTAL KNEE ARTHROPLASTY Left 11/25/2019   Procedure: TOTAL KNEE ARTHROPLASTY;  Surgeon: Frederico Hamman, MD;  Location: [facility];  Service: Orthopedics;  Laterality: Left;   UPPER GASTROINTESTINAL ENDOSCOPY     VAGINAL HYSTERECTOMY       Social History:   reports that she quit smoking about 26 years ago. She has never used smokeless tobacco. She reports current alcohol use. She reports that she does not use drugs.    Family History:  Her family history includes Cancer in her sister; Heart attack (age of onset: 20) in her father; Stomach cancer in her maternal grandfather; Stroke in her mother. There is no history of Colon cancer or Rectal cancer.   Allergies Allergies  Allergen Reactions   Wellbutrin [Bupropion] Anaphylaxis, Swelling and Rash    Urticaria Angioedema    Ace Inhibitors Cough   Levaquin [Levofloxacin] Other (See Comments)    Abdominal pain   Norvasc [Amlodipine] Other (See Comments)    Malaise      Home Medications  Prior to Admission medications   Medication Sig Start Date End Date Taking? Authorizing Provider  ALPRAZolam Prudy Feeler) 0.5 MG tablet Take 0.5 mg by mouth every 8 (eight) hours.   Yes [provider]  amphetamine-dextroamphetamine (ADDERALL) 20 MG tablet Take 20 mg by mouth daily.   Yes [provider]  DULoxetine (CYMBALTA) 60 MG capsule Take 60 mg by mouth daily.   Yes [provider]  levothyroxine (SYNTHROID) 88 MCG tablet Take 88 mcg by mouth daily.   Yes [provider]  pantoprazole (PROTONIX) 40 MG tablet Take 40 mg by mouth 2 (two) times daily.   Yes [provider]  rosuvastatin (CRESTOR) 10 MG tablet Take 10 mg by mouth at bedtime.   Yes [provider]  valsartan-hydrochlorothiazide (DIOVAN-HCT) 320-25 MG tablet Take 1 tablet by mouth daily. 10/22/19  Yes [provider]  cyanocobalamin (,VITAMIN B-12,) 1000 MCG/ML injection Inject 1,000 mcg into the muscle every 30 (thirty) days.  04/13/13   Iva Boop, MD     Critical care time: 45 min

## 2024-02-26 NOTE — Sepsis Progress Note (Signed)
 eLink is following this Code Sepsis.

## 2024-02-26 NOTE — Progress Notes (Signed)
 During admission suicide screening, pt states that she was drinking last night in order to hopefully harm herself and to "get away from the pain." She has stated that she frequently thinks about suicide and has attempted multiple times in the past to end her life. She denies current intention at this time to harm herself. Questionnaire prompted high risk interventions to be initiated. Suicide precautions in place. 1:1 sitter/observation ordered.

## 2024-02-26 NOTE — ED Notes (Signed)
 Pt placed on bair hugger

## 2024-02-26 NOTE — ED Triage Notes (Addendum)
 Pt arrived via [facility] from home. Pt's daughter called to do a welfare check on mother, because LKW 2100 yesterday. EMS found pt on floor in bedroom beside bed. EMS had to do BVM on pt. EMS gave narcan 2mg  IM. Pt arrived on NRB and moving all around. Pt was ice cold to touch. The rectal thermometer would not read temp. Pt does not follow commands.

## 2024-02-26 NOTE — Code Documentation (Signed)
 Stroke Response Nurse Documentation Code Documentation  Valerie Davis is a 83 y.o. female arriving to [facility]  via [facility] on 02/26/2024 with past medical hx significant of hypothyroidism, hypertension, and depression. Code stroke was activated by ED.   Patient from home where she was LKW at last night at 9pm when on the phone with her daughter. Her daughter states she was complaining of elevated blood pressure. A wellness check was done this morning after her daughter could not reach her and the patient was found on the ground next to her bed. She was hypothermic and hypotensive with respiratory rate of 4-6 and dried blood around her mouth and nose. Given Narcan with little response.   Stroke team met patient in ED room. Difficulty obtaining all labs as patient was still hypothermic. Lawyer in place.  Nonrebreathing on patient and patient cleared for CT by [facility]. Patient to CT with team. NIHSS 11, see documentation for details and code stroke times. Patient with disoriented, not following commands, right gaze preference , left decreased sensation, Global aphasia , and dysarthria  on exam. The following imaging was completed:  CT Head and CTA. Patient is not a candidate for IV Thrombolytic due to stroke not suspected. Patient is not a candidate for IR due to no LVO. Flumanzenil given as it was reported she has a prescription for xanax. Noted some improvement in responsiveness.   Care Plan: Q2 hours VS and NIHSS, order placed for EEG.   Process Delays Noted: Unable to obtain all labs as she was very hypothermic upon arrival. New IV placed in CT for CTA as the gauge needed was not in place.   Bedside handoff with ED RN.    Ferman Hamming  Stroke Response RN

## 2024-02-26 NOTE — Progress Notes (Signed)
 Stat EEG complete.

## 2024-02-26 NOTE — ED Provider Notes (Signed)
 Valerie Davis EMERGENCY DEPARTMENT AT [facility] Provider Note   CSN: [id number] Arrival date & time: 02/26/24  1235  An emergency department physician performed an initial assessment on this suspected stroke patient at 1301.  History  No chief complaint on file.   Valerie Davis is a 83 y.o. female.  83 year old female with a history of peptic ulcer disease and hypothyroidism who presents emergency department after being found down.  Patient reportedly had a phone call last night with another family member at 75 PM and was doing well but was complaining of elevated blood pressure.  They said they would talk in the morning but they did not hear from her so they called for a welfare check.  Patient was found on the ground.  Was given Narcan 2 mg IV with some mild response.  Did have to perform BVM on the patient briefly and then was placed on a nonrebreather.  Patient unable to give additional information.  Not on blood thinners.       Home Medications Prior to Admission medications   Medication Sig Start Date End Date Taking? Authorizing Provider  ALPRAZolam Prudy Feeler) 0.5 MG tablet Take 0.5 mg by mouth every 8 (eight) hours.   Yes [provider]  amphetamine-dextroamphetamine (ADDERALL) 20 MG tablet Take 20 mg by mouth daily.   Yes [provider]  DULoxetine (CYMBALTA) 60 MG capsule Take 60 mg by mouth daily.   Yes [provider]  levothyroxine (SYNTHROID) 88 MCG tablet Take 88 mcg by mouth daily.   Yes [provider]  pantoprazole (PROTONIX) 40 MG tablet Take 40 mg by mouth 2 (two) times daily.   Yes [provider]  rosuvastatin (CRESTOR) 10 MG tablet Take 10 mg by mouth at bedtime.   Yes [provider]  valsartan-hydrochlorothiazide (DIOVAN-HCT) 320-25 MG tablet Take 1 tablet by mouth daily. 10/22/19  Yes [provider]  cyanocobalamin (,VITAMIN B-12,) 1000 MCG/ML injection Inject 1,000 mcg into the muscle every  30 (thirty) days.  04/13/13   Iva Boop, MD      Allergies    Wellbutrin [bupropion], Ace inhibitors, Levaquin [levofloxacin], and Norvasc [amlodipine]    Review of Systems   Review of Systems  Physical Exam Updated Vital Signs BP 132/81   Pulse 87   Temp (!) 92.7 F (33.7 C) (Rectal)   Resp 19   SpO2 95%  Physical Exam Constitutional:      General: She is in acute distress.     Appearance: She is ill-appearing.     Comments: Obtunded.  HENT:     Head: Normocephalic and atraumatic.     Mouth/Throat:     Comments: Dried dark emesis on patient's face Eyes:     Pupils: Pupils are equal, round, and reactive to light.     Comments: Pupils staring straight ahead 3 mm bilaterally  Cardiovascular:     Rate and Rhythm: Normal rate and regular rhythm.  Pulmonary:     Comments: Respiratory distress.  Lungs clear to auscultation anteriorly.  Nonrebreather in place. Abdominal:     General: There is no distension.     Palpations: There is no mass.     Tenderness: There is no abdominal tenderness. There is no guarding.  Genitourinary:    Comments: Chaperoned by patient's tech lucy. No external hemorrhoids or fissures noted on external inspection. No internal masses or hemorroids noted. Normal rectal tone. Brown stool in rectal vault. No melena or gross blood.  Musculoskeletal:  Cervical back: Normal range of motion and neck supple.  Neurological:     Comments: Moving right upper and right lower extremity normally.  Decreased strength of left upper and left lower extremity.  No gaze preference.     ED Results / Procedures / Treatments   Labs (all labs ordered are listed, but only abnormal results are displayed) Labs Reviewed  PROTIME-INR - Abnormal; Notable for the following components:      Result Value   Prothrombin Time 15.4 (*)    All other components within normal limits  CBC - Abnormal; Notable for the following components:   WBC 3.7 (*)    RBC 3.45 (*)     Hemoglobin 10.1 (*)    HCT 31.2 (*)    All other components within normal limits  DIFFERENTIAL - Abnormal; Notable for the following components:   Lymphs Abs 0.6 (*)    All other components within normal limits  COMPREHENSIVE METABOLIC PANEL WITH GFR - Abnormal; Notable for the following components:   Potassium 3.1 (*)    Glucose, Bld 201 (*)    Creatinine, Ser 1.57 (*)    Calcium 8.1 (*)    Total Protein 5.3 (*)    Albumin 3.0 (*)    GFR, Estimated 33 (*)    All other components within normal limits  CK - Abnormal; Notable for the following components:   Total CK 781 (*)    All other components within normal limits  T4, FREE - Abnormal; Notable for the following components:   Free T4 1.47 (*)    All other components within normal limits  LACTIC ACID, PLASMA - Abnormal; Notable for the following components:   Lactic Acid, Venous 3.2 (*)    All other components within normal limits  LACTIC ACID, PLASMA - Abnormal; Notable for the following components:   Lactic Acid, Venous 2.0 (*)    All other components within normal limits  HEMOGLOBIN AND HEMATOCRIT, BLOOD - Abnormal; Notable for the following components:   Hemoglobin 10.6 (*)    HCT 33.2 (*)    All other components within normal limits  ACETAMINOPHEN LEVEL - Abnormal; Notable for the following components:   Acetaminophen (Tylenol), Serum <10 (*)    All other components within normal limits  SALICYLATE LEVEL - Abnormal; Notable for the following components:   Salicylate Lvl <7.0 (*)    All other components within normal limits  I-STAT CHEM 8, ED - Abnormal; Notable for the following components:   Potassium 3.0 (*)    BUN 26 (*)    Creatinine, Ser 1.70 (*)    Glucose, Bld 198 (*)    Calcium, Ion 1.05 (*)    All other components within normal limits  CBG MONITORING, ED - Abnormal; Notable for the following components:   Glucose-Capillary 201 (*)    All other components within normal limits  I-STAT ARTERIAL BLOOD GAS, ED  - Abnormal; Notable for the following components:   pH, Arterial 7.333 (*)    pO2, Arterial 140 (*)    Acid-base deficit 3.0 (*)    Potassium 3.1 (*)    HCT 28.0 (*)    Hemoglobin 9.5 (*)    All other components within normal limits  CULTURE, BLOOD (ROUTINE X 2)  CULTURE, BLOOD (ROUTINE X 2)  ETHANOL  APTT  RAPID URINE DRUG SCREEN, HOSP PERFORMED  URINALYSIS, ROUTINE W REFLEX MICROSCOPIC  TSH  BASIC METABOLIC PANEL WITH GFR  AMMONIA  CK  CK  CBC  CORTISOL  URINALYSIS, W/ REFLEX TO CULTURE (INFECTION SUSPECTED)  HEMOGLOBIN A1C  BASIC METABOLIC PANEL WITH GFR  CBC  MAGNESIUM  PHOSPHORUS  I-STAT CG4 LACTIC ACID, ED  POC OCCULT BLOOD, ED  TYPE AND SCREEN  TROPONIN I (HIGH SENSITIVITY)    EKG EKG Interpretation Date/Time:  Friday February 26 2024 12:40:52 EDT Ventricular Rate:  62 PR Interval:  217 QRS Duration:  127 QT Interval:  560 QTC Calculation: 569 R Axis:   -14  Text Interpretation: Sinus rhythm Borderline prolonged PR interval IVCD, consider atypical LBBB Confirmed by Vonita Moss [phone]) on 02/26/2024 2:18:12 PM  Radiology DG Chest Portable 1 View Result Date: 02/26/2024 CLINICAL DATA:  Fall. EXAM: PORTABLE CHEST 1 VIEW COMPARISON:  Chest radiograph dated 08/23/2013. FINDINGS: Cardiac silhouette is enlarged. Moderate-to-large hiatal hernia again noted. Hazy opacities in the left mid to lower lung zone. The right lung appears clear. No sizable pleural effusion or pneumothorax. No acute osseous abnormality identified. IMPRESSION: 1. Hazy opacities in the left mid to lower lung zone, may be infectious/inflammatory. 2. Cardiomegaly. 3. Moderate-to-large hiatal hernia, similar to prior. Electronically Signed   By: Hart Robinsons M.D.   On: 02/26/2024 15:30   DG Pelvis Portable Result Date: 02/26/2024 CLINICAL DATA:  Fall. EXAM: PORTABLE PELVIS 1-2 VIEWS COMPARISON:  None Available. FINDINGS: There is no evidence of acute pelvic fracture or diastasis. Sacroiliac  joints and pubic symphysis are anatomically aligned. Mild degenerative changes of the bilateral hips. Excreted vicarious contrast is noted within the bilateral ureters and bladder. IMPRESSION: No acute osseous abnormality. Electronically Signed   By: Hart Robinsons M.D.   On: 02/26/2024 15:25   CT C-SPINE NO CHARGE Result Date: 02/26/2024 CLINICAL DATA:  Found down.  Unresponsive. EXAM: CT CERVICAL SPINE WITHOUT CONTRAST TECHNIQUE: Multidetector CT imaging of the cervical spine was performed without intravenous contrast. Multiplanar CT image reconstructions were also generated. RADIATION DOSE REDUCTION: This exam was performed according to the departmental dose-optimization program which includes automated exposure control, adjustment of the mA and/or kV according to patient size and/or use of iterative reconstruction technique. COMPARISON:  None Available. FINDINGS: Alignment: Slight degenerative anterolisthesis is present at C4-5. No other significant listhesis is present. Straightening of the normal cervical lordosis is present. Skull base and vertebrae: The craniocervical junction is within normal limits. Soft tissues and spinal canal: No prevertebral fluid or swelling. No visible canal hematoma. Disc levels: Multilevel uncovertebral and facet hypertrophy is present. Moderate uncal scratched at moderate foraminal stenosis is present C5-6 and C6-7. Upper chest: The lung apices are clear. No significant pleural effusion or pneumothorax is present. IMPRESSION: 1. No acute fracture or traumatic subluxation. 2. Multilevel degenerative changes of the cervical spine as described. Electronically Signed   By: Marin Roberts M.D.   On: 02/26/2024 14:06   CT ANGIO HEAD NECK W WO CM W PERF (CODE STROKE) Result Date: 02/26/2024 CLINICAL DATA:  Code stroke.  Neuro deficit. EXAM: CT ANGIOGRAPHY HEAD AND NECK CT PERFUSION BRAIN TECHNIQUE: Multidetector CT imaging of the head and neck was performed using the  standard protocol during bolus administration of intravenous contrast. Multiplanar CT image reconstructions and MIPs were obtained to evaluate the vascular anatomy. Carotid stenosis measurements (when applicable) are obtained utilizing NASCET criteria, using the distal internal carotid diameter as the denominator. Multiphase CT imaging of the brain was performed following IV bolus contrast injection. Subsequent parametric perfusion maps were calculated using RAPID software. RADIATION DOSE REDUCTION: This exam was performed according to the departmental dose-optimization program which includes automated  exposure control, adjustment of the mA and/or kV according to patient size and/or use of iterative reconstruction technique. CONTRAST:  OMNIPAQUE IOHEXOL 350 MG/ML SOLN COMPARISON:  None Available. FINDINGS: CTA NECK FINDINGS Aortic arch: A 3 vessel arch configuration is present. No significant atherosclerotic changes are present. Great vessel origins are widely patent. Right carotid system: The right common carotid artery is within normal limits. Bifurcation is unremarkable. Mild tortuosity is present in the cervical right ICA without significant stenosis in the neck. Left carotid system: The left common carotid artery is within normal limits. Bifurcation is unremarkable. Cervical left ICA is mildly tortuous without significant stenosis. It is otherwise within normal limits. Vertebral arteries: The left vertebral artery is dominant vessel. Both vertebral arteries originate from the subclavian arteries without significant stenosis. No significant stenosis is present in either vertebral artery in the neck. Skeleton: The vertebral body heights and alignment are normal. No focal osseous lesions are present. Other neck: The soft tissues of the neck are otherwise unremarkable. Salivary glands are within normal limits. Thyroid is normal. No significant adenopathy is present. No focal mucosal or submucosal lesions are  present. Upper chest: Ill-defined airspace opacity is present in the lateral left upper lobe. The lungs are otherwise clear. No significant effusion or pneumothorax is present. Review of the MIP images confirms the above findings CTA HEAD FINDINGS Anterior circulation: The internal carotid arteries are within normal limits from the skull base to the ICA termini. The A1 and M1 segments are normal. The MCA bifurcations are unremarkable. The ACA and MCA branch vessels are within normal limits. Posterior circulation: The left vertebral artery is dominant. PICA origins are visualized and normal. The vertebrobasilar junction basilar artery are within normal limits. Both posterior cerebral arteries originate from basilar tip. The PCA branches are normal bilaterally. No aneurysm is present. Venous sinuses: The dural sinuses are patent. The straight sinus and deep cerebral veins are intact. Cortical veins are within normal limits. No significant vascular malformation is evident. Anatomic variants: None Review of the MIP images confirms the above findings CT Brain Perfusion Findings: ASPECTS: 10 CBF (<30%) Volume: 0mL Perfusion (Tmax>6.0s) volume: 0mL Mismatch Volume: 0mL IMPRESSION: 1. Normal CTA of the head and neck. No large vessel occlusion or hemodynamically significant stenosis. 2. Normal CT perfusion. 3. Ill-defined airspace opacity in the lateral left upper lobe is nonspecific, but may represent atelectasis or infection. Electronically Signed   By: Marin Roberts M.D.   On: 02/26/2024 13:51   CT HEAD CODE STROKE WO CONTRAST Result Date: 02/26/2024 CLINICAL DATA:  Code stroke. Neuro deficit, acute, stroke suspected. EXAM: CT HEAD WITHOUT CONTRAST TECHNIQUE: Contiguous axial images were obtained from the base of the skull through the vertex without intravenous contrast. RADIATION DOSE REDUCTION: This exam was performed according to the departmental dose-optimization program which includes automated exposure  control, adjustment of the mA and/or kV according to patient size and/or use of iterative reconstruction technique. COMPARISON:  None Available. FINDINGS: Brain: No acute hemorrhage. Background of mild chronic small-vessel disease. Gray-white differentiation is preserved. No hydrocephalus or extra-axial collection. No mass effect or midline shift. Vascular: No hyperdense vessel or unexpected calcification. Skull: No calvarial fracture or suspicious bone lesion. Skull base is unremarkable. Sinuses/Orbits: No acute finding. Other: None. ASPECTS [facility] Stroke Program Early CT Score) - Ganglionic level infarction (caudate, lentiform nuclei, internal capsule, insula, M1-M3 cortex): 7 - Supraganglionic infarction (M4-M6 cortex): 3 Total score (0-10 with 10 being normal): 10 IMPRESSION: 1. No acute intracranial hemorrhage or evidence  of evolving large vessel territory infarct. ASPECT score is 10. 2. Background of mild chronic small-vessel disease. Code stroke imaging results were communicated on 02/26/2024 at 1:25 pm to provider Dr. Wilford Corner via secure text paging. Electronically Signed   By: Orvan Falconer M.D.   On: 02/26/2024 13:31    Procedures Procedures    Medications Ordered in ED Medications  norepinephrine (LEVOPHED) 4mg  in (0.016 mg/mL) premix infusion (has no administration in time range)  levothyroxine (SYNTHROID) tablet 88 mcg (has no administration in time range)  pantoprazole (PROTONIX) injection 40 mg (has no administration in time range)  lactated ringers infusion ( Intravenous New Bag/Given 02/26/24 1714)  0.9 %  sodium chloride infusion (has no administration in time range)  insulin aspart (novoLOG) injection 0-6 Units (has no administration in time range)  potassium chloride 10 mEq in 100 mL IVPB (10 mEq Intravenous New Bag/Given 02/26/24 1717)  cefTRIAXone (ROCEPHIN) 2 g in sodium chloride 0.9 % 100 mL IVPB (has no administration in time range)  metroNIDAZOLE (FLAGYL) IVPB 500 mg  (has no administration in time range)  naloxone [facility]) 2 MG/2ML injection (2 mg  Given 02/26/24 1254)  sodium chloride 0.9 % bolus 1,000 mL (0 mLs Intravenous Stopped 02/26/24 1513)    And  sodium chloride 0.9 % bolus 1,000 mL (0 mLs Intravenous Stopped 02/26/24 1525)    And  sodium chloride 0.9 % bolus 250 mL (0 mLs Intravenous Stopped 02/26/24 1538)  ceFEPIme (MAXIPIME) 2 g in sodium chloride 0.9 % 100 mL IVPB (0 g Intravenous Stopped 02/26/24 1520)  metroNIDAZOLE (FLAGYL) IVPB 500 mg (0 mg Intravenous Stopped 02/26/24 1520)  vancomycin (VANCOCIN) IVPB 1000 mg/200 mL premix (0 mg Intravenous Stopped 02/26/24 1720)  iohexol (OMNIPAQUE) 350 MG/ML injection 100 mL (100 mLs Intravenous Contrast Given 02/26/24 1342)  flumazenil (ROMAZICON) injection 0.4 mg (0.4 mg Intravenous Given 02/26/24 1445)  PHENYLephrine 80 mcg/ml in normal saline Adult IV Push Syringe (For Blood Pressure Support) (160 mcg Intravenous Given 02/26/24 1235)  haloperidol lactate (HALDOL) injection 5 mg (5 mg Intravenous Given 02/26/24 1259)    ED Course/ Medical Decision Making/ A&P Clinical Course as of 02/26/24 1738  Fri Feb 26, 2024  1301 DNR/DNI confirmed with daughter [RP]  1346 Given flumazenil and mental status is improving [RP]  1456 EEG with beta activity consistent with possible overdose. [RP]  1541 Anders Simmonds from icu consulted and will evaluate for admission. [RP]    Clinical Course User Index [RP] Rondel Baton, MD                                 Medical Decision Making Amount and/or Complexity of Data Reviewed Labs: ordered. Radiology: ordered.  Risk Prescription drug management. Decision regarding hospitalization.   Valerie Davis is a 83 y.o. female with comorbidities that complicate the patient evaluation including peptic ulcer disease and hypothyroidism who presents emergency department after being found down.    Initial Ddx:  Overdose, TBI, C-spine injury, stroke, ICH, hypothyroidism,  myxedema coma, septic shock  MDM/Course:  Patient presents emergency department after being found down.  Appears that someone was talking to her at 9 PM the night before.  She was complaining of high blood pressure at that time.  On arrival patient is obtunded.  Did receive Narcan by EMS and did have to be bagged for short period of time.  Does have some dark emesis on her face.  She  initially was moving the left side of her body less than the right side.  Did have some airway concerns so called her daughter who stated that patient is DNR/DNI.  Patient was not intubated.  With the decreased movement of her left side neurology was consulted.  She underwent a CTA of the head and neck as well as noncontrast head CT.  Did not reveal any acute abnormalities.  While in CT she began to get hypotensive.  Was given push dose pressors and was started on Levophed and given 30 mL/kg bolus of IV fluids and started on broad-spectrum antibiotics since she is hypothermic and could potentially be septic.  She underwent an EEG that was concerning for possible encephalopathy due to polypharmacy or overdose.  She was given flumazenil and reportedly did have some response to it.  Upon reevaluation patient had a persistent pressor and 4 L oxygen requirement.  Did have a chest x-ray which shows left upper lobe infiltrate may have a pneumonia.  She was admitted to the ICU for further management.  This patient presents to the ED for concern of complaints listed in HPI, this involves an extensive number of treatment options, and is a complaint that carries with it a high risk of complications and morbidity. Disposition including potential need for admission considered.   Dispo: ICU  Additional history obtained from daughter The following labs were independently interpreted: Chemistry and show CKD I independently reviewed the following imaging with scope of interpretation limited to determining acute life threatening conditions  related to emergency care: CT Head and agree with the radiologist interpretation with the following exceptions: none I personally reviewed and interpreted the pt's EKG: see above for interpretation  I have reviewed the patients home medications and made adjustments as needed Consults: Critical care and Neurology Social Determinants of health:  Geriatric  Portions of this note were generated with Scientist, clinical (histocompatibility and immunogenetics). Dictation errors may occur despite best attempts at proofreading.    CRITICAL CARE Performed by: Rondel Baton   Total critical care time: 60 minutes  Critical care time was exclusive of separately billable procedures and treating other patients.  Critical care was necessary to treat or prevent imminent or life-threatening deterioration.  Critical care was time spent personally by me on the following activities: development of treatment plan with patient and/or surrogate as well as nursing, discussions with consultants, evaluation of patient's response to treatment, examination of patient, obtaining history from patient or surrogate, ordering and performing treatments and interventions, ordering and review of laboratory studies, ordering and review of radiographic studies, pulse oximetry and re-evaluation of patient's condition.   Final Clinical Impression(s) / ED Diagnoses Final diagnoses:  Shock (HCC)  Hypothermia, initial encounter  Altered mental status, unspecified altered mental status type  Hypoxia  Pneumonia of left upper lobe due to infectious organism    Rx / DC Orders ED Discharge Orders     None         Rondel Baton, MD 02/26/24 [phone]

## 2024-02-26 NOTE — Procedures (Signed)
 History: 83 year old female being evaluated for possible seizure with stat EEG  Sedation: None, though flumazenil was given  Patient State: Awake and drowsy  Technique: This EEG was acquired with electrodes placed according to the International 10-20 electrode system (including Fp1, Fp2, F3, F4, C3, C4, P3, P4, O1, O2, T3, T4, T5, T6, A1, A2, Fz, Cz, Pz). The following electrodes were missing or displaced: none.   Background: The background is notable for excessive beta activity throughout the recording.  In addition, there is a posterior dominant rhythm of 10 Hz which is seen at times.  There is also generalized irregular slow activity even in the maximal wakeful state.  No epileptiform discharges were seen.  Photic stimulation: Physiologic driving is not performed  EEG Abnormalities: 1) excessive beta activity 2) generalized slow activity  Clinical Interpretation: This EEG is consistent with a mild generalized nonspecific cerebral dysfunction.  The excess beta activity is likely secondary to medication effect.   There was no seizure or seizure predisposition recorded on this study. Please note that lack of epileptiform activity on EEG does not preclude the possibility of epilepsy.   Ritta Slot, MD [facility]   If 7pm- 7am, please page neurology on call as listed in AMION.

## 2024-02-26 NOTE — ED Notes (Signed)
 Pt bp was in the 70's in ct and when first arrived. Unable to obtain from monitor hx. Fluid boluses and norepi started.

## 2024-02-26 NOTE — Consult Note (Signed)
 NEUROLOGY CONSULT NOTE   Date of service: February 26, 2024 Patient Name: Valerie Davis MRN:  [id number] DOB:  09/18/1941 Chief Complaint: "Code stroke" Requesting Provider: Rondel Baton, MD  History of Present Illness  Valerie Davis is a 83 y.o. female with hx of hypertension, hypothyroidism, insomnia, deficiency anemia, ADD, asthma, anxiety and depression, GERD, geminal neuralgia who presents to [facility] ED via [facility].  Last known well 2100 last night.  Family was unable to get a hold of her and did a wellness check.  Patient was found on the ground beside her bed on her left side with decreased respiratory rate 4-6, hypotension with dried blood around her nose and mouth.  She was given Narcan and became a little bit more alert but not following commands and became agitated.  In the emergency room code stroke was activated by EDP for left-sided weakness.  He is on a Lawyer because she is hypothermic CT head with no acute process.  CTA with no LVO.  NIH 11 but exam is inconsistent ABG ordered results are pending, labs blood cultures pending She was given 0.4 mg of flumazenil and patient was able to follow commands and tell us her name and age  LKW: 2100 Modified rankin score: 0-Completely asymptomatic and back to baseline post- stroke IV Thrombolysis:  No outside window, likely not a stroke EVT:  No LVO   NIHSS components Score: Comment  1a Level of Conscious 0[x]  1[]  2[]  3[]      1b LOC Questions 0[]  1[]  2[x]       1c LOC Commands 0[]  1[]  2[x]       2 Best Gaze 0[]  1[x]  2[]       3 Visual 0[x]  1[]  2[]  3[]      4 Facial Palsy 0[x]  1[]  2[]  3[]      5a Motor Arm - left 0[x]  1[]  2[]  3[]  4[]  UN[]    5b Motor Arm - Right 0[x]  1[]  2[]  3[]  4[]  UN[]    6a Motor Leg - Left 0[x]  1[]  2[]  3[]  4[]  UN[]    6b Motor Leg - Right 0[x]  1[]  2[]  3[]  4[]  UN[]    7 Limb Ataxia 0[x]  1[]  2[]  3[]  UN[]     8 Sensory 0[]  1[x]  2[]  UN[]      9 Best Language 0[]  1[]  2[]  3[x]      10 Dysarthria 0[]  1[]  2[x]   UN[]      11 Extinct. and Inattention 0[x]  1[]  2[]       TOTAL: 11      ROS  Comprehensive ROS Unable to ascertain due to AMS  Past History   Past Medical History:  Diagnosis Date   ADD (attention deficit disorder)    Arthritis    Asthma    Blood transfusion without reported diagnosis    Depression    Diverticulosis    GERD (gastroesophageal reflux disease)    H/O: GI bleed    erosive gastritis (NSAID) 2007 in [place]   Hypertension    Hypothyroidism    Insomnia    Iron deficiency anemia    Osteopenia    Personal history of colonic adenoma 07/06/2013   Pneumonia    Trigeminal neuralgia     hx of    Past Surgical History:  Procedure Laterality Date   ABDOMINAL HYSTERECTOMY  age 62   BILATERAL SALPINGOOPHORECTOMY     COLONOSCOPY     HIATAL HERNIA REPAIR N/A 08/30/2013   Procedure: LAPAROSCOPIC REPAIR OF HIATAL HERNIA;  Surgeon: Valarie Merino, MD;  Location: [facility];  Service:  General;  Laterality: N/A;  With MESH   knee menscectomy Right 07/2009   TOTAL KNEE ARTHROPLASTY Left 11/25/2019   Procedure: TOTAL KNEE ARTHROPLASTY;  Surgeon: Frederico Hamman, MD;  Location: [facility];  Service: Orthopedics;  Laterality: Left;   UPPER GASTROINTESTINAL ENDOSCOPY     VAGINAL HYSTERECTOMY      Family History: Family History  Problem Relation Age of Onset   Heart attack Father 60   Stroke Mother    Cancer Sister        sarcoma   Stomach cancer Maternal Grandfather    Colon cancer Neg Hx    Rectal cancer Neg Hx     Social History  reports that she quit smoking about 26 years ago. She has never used smokeless tobacco. She reports current alcohol use. She reports that she does not use drugs.  Allergies  Allergen Reactions   Wellbutrin [Bupropion] Anaphylaxis   Ace Inhibitors Cough   Amlodipine Besylate     malaise    Bupropion Hcl     urticaria, angioedema caused by wellbutrin   Levofloxacin     abdominal pain   Pantoprazole Sodium Rash    Medications   Current  Facility-Administered Medications:    0.9 %  sodium chloride infusion, 500 mL, Intravenous, Once, Iva Boop, MD   ceFEPIme (MAXIPIME) 2 g in sodium chloride 0.9 % 100 mL IVPB, 2 g, Intravenous, Once, Rondel Baton, MD   flumazenil [facility]) injection 0.4 mg, 0.4 mg, Intravenous, Once, Milon Dikes, MD   metroNIDAZOLE (FLAGYL) IVPB 500 mg, 500 mg, Intravenous, Once, Rondel Baton, MD   naloxone [facility]) 0.4 MG/ML injection, , , ,    naloxone (NARCAN) 2 MG/2ML injection, , , ,    norepinephrine (LEVOPHED) 4-5 MG/250ML-% infusion SOLN, , , ,    sodium chloride 0.9 % bolus 1,000 mL, 1,000 mL, Intravenous, Once **AND** sodium chloride 0.9 % bolus 1,000 mL, 1,000 mL, Intravenous, Once **AND** sodium chloride 0.9 % bolus 250 mL, 250 mL, Intravenous, Once, Rondel Baton, MD   vancomycin (VANCOCIN) IVPB 1000 mg/200 mL premix, 1,000 mg, Intravenous, Once, Rondel Baton, MD  Current Outpatient Medications:    ALPRAZolam (XANAX) 0.5 MG tablet, Take 0.5 mg by mouth at bedtime as needed for sleep., Disp: , Rfl:    amphetamine-dextroamphetamine (ADDERALL) 20 MG tablet, Take 20 mg by mouth See admin instructions. Take 20 mg in the morning, may take second 20 mg dose as needed ADD symptoms, Disp: , Rfl:    Ascorbic Acid (VITAMIN C) 1000 MG tablet, Take 1,000 mg by mouth 2 (two) times daily., Disp: , Rfl:    cholecalciferol (VITAMIN D3) 25 MCG (1000 UT) tablet, Take 1,000 Units by mouth at bedtime., Disp: , Rfl:    cyanocobalamin (,VITAMIN B-12,) 1000 MCG/ML injection, Inject 1,000 mcg into the muscle every 30 (thirty) days. , Disp: 1 mL, Rfl: 0   DULoxetine (CYMBALTA) 60 MG capsule, Take 60 mg by mouth daily., Disp: , Rfl:    gabapentin (NEURONTIN) 300 MG capsule, Take 1 capsule (300 mg total) by mouth 3 (three) times daily for 14 days, THEN 1 capsule (300 mg total) 2 (two) times daily for 3 days, THEN 1 capsule (300 mg total) daily for 4 days., Disp: 52 capsule, Rfl: 0   levothyroxine  (SYNTHROID) 100 MCG tablet, Take 100 mcg by mouth daily before breakfast., Disp: , Rfl:    omeprazole (PRILOSEC) 40 MG capsule, Take 40 mg by mouth at bedtime., Disp: , Rfl:  oxyCODONE (OXY IR/ROXICODONE) 5 MG immediate release tablet, Take one tab po q4-6hrs prn pain, may need 1-2 first couple weeks, Disp: 40 tablet, Rfl: 0   POLY-IRON 150 150 MG capsule, Take 150 mg by mouth at bedtime., Disp: , Rfl:    potassium chloride SA (KLOR-CON) 20 MEQ tablet, Take 20 mEq by mouth daily., Disp: , Rfl:    rosuvastatin (CRESTOR) 10 MG tablet, Take 10 mg by mouth at bedtime., Disp: , Rfl:    valsartan-hydrochlorothiazide (DIOVAN-HCT) 320-25 MG tablet, Take 1 tablet by mouth daily., Disp: , Rfl:   Vitals   Vitals:   2024-03-07 1313 Mar 07, 2024 1322 2024-03-07 1335 Mar 07, 2024 1337  BP: (!) 72/59 106/62 90/79 109/76  Pulse:    67  Resp:    18  SpO2:    100%    There is no height or weight on file to calculate BMI.  Physical Exam   Constitutional: Acutely ill female Psych: Affect appropriate to situation.  Eyes: No scleral injection.  HENT: No OP obstruction.  Head: Normocephalic.  Cardiovascular: Normal rate and regular rhythm.  Respiratory: Effort normal, non-labored breathing.  GI: Soft.  No distension. There is no tenderness.  Skin: WDI.   Neurologic Examination   Mental Status -  She responds to voice and stimulation, eyes are closed she will open them spontaneously, intermittently following commands, was able to tell us her name and age  Cranial Nerves II - XII - II - Visual field intact OU. III, IV, VI - Extraocular movements intact. V - Facial sensation intact bilaterally. VII - Facial movement intact bilaterally. VIII - Hearing & vestibular intact bilaterally. X - Palate elevates symmetrically. XI - Chin turning & shoulder shrug intact bilaterally. XII - Tongue protrusion intact.  Motor Strength -she does move all 4 extremities spontaneously and antigravity bulk was normal and  fasciculations were absent.   Motor Tone - Muscle tone was assessed at the neck and appendages and was normal. Sensory -consistent to noxious stimuli Coordination -unable to assess due to mental status Gait and Station - deferred.  Labs/Imaging/Neurodiagnostic studies   CBC:  Recent Labs  Lab 03/07/2024 1345 07-Mar-2024 1350  WBC 3.7*  --   NEUTROABS 2.9  --   HGB 10.1* 9.5*  HCT 31.2* 28.0*  MCV 90.4  --   PLT 172  --    Basic Metabolic Panel:  Lab Results  Component Value Date   NA 136 03-07-24   K 3.1 (L) March 07, 2024   CO2 28 11/17/2019   GLUCOSE 198 (H) 03-07-24   BUN 26 (H) 03/07/2024   CREATININE 1.70 (H) 03/07/2024   CALCIUM 9.5 11/17/2019   GFRNONAA 41 (L) 11/17/2019   GFRAA 47 (L) 11/17/2019   Lipid Panel: No results found for: "LDLCALC" HgbA1c: No results found for: "HGBA1C" Urine Drug Screen: No results found for: "LABOPIA", "COCAINSCRNUR", "LABBENZ", "AMPHETMU", "THCU", "LABBARB"  Alcohol Level No results found for: "ETH" INR No results found for: "INR" APTT No results found for: "APTT" AED levels: No results found for: "PHENYTOIN", "ZONISAMIDE", "LAMOTRIGINE", "LEVETIRACETA"  CT Head without contrast(Personally reviewed): No acute process aspects 10  CT angio Head and Neck with contrast(Personally reviewed): No LVO  Neurodiagnostics STAT rEEG:  Ordered  ASSESSMENT   Valerie Davis is a 83 y.o. female hx of hypertension, hypothyroidism, insomnia, deficiency anemia, ADD, asthma, anxiety and depression, GERD, geminal neuralgia who presents to [facility] ED via [facility].  Last known well 2100 last night.  Family was unable to get a  hold of her and did a wellness check.  Patient was found on the ground beside her bed on her left side with decreased respiratory rate 4-6, hypotension with dried blood around her nose and mouth.  She was given flumazenil and became a little bit more alert but not following commands and became agitated.  RECOMMENDATIONS   Stat routine EEG Obtain ABG Obtain blood cultures, UA, chest x-ray and labs, tick acid UDS, EtOH level  ______________________________________________________________________    Signed, Mathews Argyle, NP [facility]   Attending Neurohospitalist Addendum Patient seen and examined with APP/Resident. Agree with the history and physical as documented above. Agree with the plan as documented, which I helped formulate. I have independently reviewed the chart, obtained history, review of systems and examined the patient.I have personally reviewed pertinent head/neck/spine imaging (CT/MRI).  Briefly 83 year old woman brought in for evaluation of unresponsiveness-found down.  Past history of ADD, asthma, anxiety and depression on benzodiazepines, trigeminal neuralgia on gabapentin, last known well at 9 PM yesterday when someone from the family spoke with her.  Daughter lives next door but was traveling to [place] to be with her father who is in a facility due to his dementia.  Patient was extremely unresponsive and the ED provider initially had some concern for left-sided weakness for which a code stroke was activated since she was also not talking and obtunded.  On my initial examination, she was very obtunded but was spontaneously moving all 4 extremities to noxious stimulation.  Head imaging was completed in the form of CT head, CT angiography head and neck-personally reviewed-no acute findings.  No perfusion deficit.  Ill-defined airspace opacity in the lateral left upper lobe-nonspecific but may represent atelectasis or infection. She has lactic acidosis on exam. Daughter reports that she takes standing doses of Xanax.    Her examination on arrival was-completely obtunded, not following commands, pupils equal round reactive, no blink to threat, withdrawal to noxious stimulation but no ability to follow commands.   She was given flumazenil for potentially reversing any overdose  of Xanax since not much history is known.  Soon after receiving the flumazenil injection, she started to come around, was able to tell her name and was able to lift both lower extremities to command and with coaching to a point where they were not drifting.  She was able to tell her correct age to me with a dysarthric speech.  I suspect some sort of toxic or metabolic encephalopathy at this time since she was hypotensive, hypothermic, altered mentation on arrival but given her exam with no focal deficits and CT head and CT angio head and neck with no acute findings --do not think she has any intervenable large vessel occlusion stroke.  If she has had a small stroke, I cannot be very short of that but an MRI can be done at some point to rule that out.  The only thing that I would do emergently is to get an EEG to make sure she did not have any evidence of ongoing electrographic abnormality such as seizures.  If the EEG is unremarkable, I would let the medicine teams manage toxic metabolic workup to figure out what might be causing her altered mentation.  Impression: Toxic/metabolic encephalopathy  Recommendations: Stat EEG-if negative, medical management per primary teams MRI brain without contrast when stable to rule out an acute stroke.    Please feel free to call with any questions.  -- Milon Dikes, MD Neurologist [facility] Pager: [phone]

## 2024-02-27 ENCOUNTER — Inpatient Hospital Stay (HOSPITAL_COMMUNITY)

## 2024-02-27 DIAGNOSIS — E039 Hypothyroidism, unspecified: Secondary | ICD-10-CM

## 2024-02-27 DIAGNOSIS — J9601 Acute respiratory failure with hypoxia: Secondary | ICD-10-CM | POA: Diagnosis not present

## 2024-02-27 DIAGNOSIS — G9341 Metabolic encephalopathy: Secondary | ICD-10-CM | POA: Diagnosis not present

## 2024-02-27 DIAGNOSIS — M6282 Rhabdomyolysis: Secondary | ICD-10-CM

## 2024-02-27 DIAGNOSIS — R571 Hypovolemic shock: Secondary | ICD-10-CM | POA: Diagnosis not present

## 2024-02-27 DIAGNOSIS — R578 Other shock: Secondary | ICD-10-CM

## 2024-02-27 LAB — BASIC METABOLIC PANEL WITH GFR
Anion gap: 11 (ref 5–15)
Anion gap: 12 (ref 5–15)
BUN: 20 mg/dL (ref 8–23)
BUN: 13 mg/dL (ref 8–23)
CO2: 22 mmol/L (ref 22–32)
CO2: 22 mmol/L (ref 22–32)
Calcium: 8.2 mg/dL — ABNORMAL LOW (ref 8.9–10.3)
Calcium: 8.6 mg/dL — ABNORMAL LOW (ref 8.9–10.3)
Chloride: 106 mmol/L (ref 98–111)
Chloride: 103 mmol/L (ref 98–111)
Creatinine, Ser: 1.11 mg/dL — ABNORMAL HIGH (ref 0.44–1.00)
Creatinine, Ser: 0.89 mg/dL (ref 0.44–1.00)
GFR, Estimated: 50 mL/min — ABNORMAL LOW (ref >60)
GFR, Estimated: >60 mL/min (ref >60)
Glucose, Bld: 110 mg/dL — ABNORMAL HIGH (ref 70–99)
Glucose, Bld: 116 mg/dL — ABNORMAL HIGH (ref 70–99)
Potassium: 3.2 mmol/L — ABNORMAL LOW (ref 3.5–5.1)
Potassium: 3.6 mmol/L (ref 3.5–5.1)
Sodium: 139 mmol/L (ref 135–145)
Sodium: 137 mmol/L (ref 135–145)

## 2024-02-27 LAB — CBC
HCT: 32.1 % — ABNORMAL LOW (ref 36.0–46.0)
Hemoglobin: 10.8 g/dL — ABNORMAL LOW (ref 12.0–15.0)
MCH: 28.6 pg (ref 26.0–34.0)
MCHC: 33.6 g/dL (ref 30.0–36.0)
MCV: 85.1 fL (ref 80.0–100.0)
Platelets: 208 10*3/uL (ref 150–400)
RBC: 3.77 MIL/uL — ABNORMAL LOW (ref 3.87–5.11)
RDW: 13.3 % (ref 11.5–15.5)
WBC: 9.9 10*3/uL (ref 4.0–10.5)
nRBC: 0 % (ref 0.0–0.2)

## 2024-02-27 LAB — PHOSPHORUS: Phosphorus: 4 mg/dL (ref 2.5–4.6)

## 2024-02-27 LAB — CK TOTAL AND CKMB (NOT AT ARMC)
CK, MB: 249.2 ng/mL — ABNORMAL HIGH (ref 0.5–5.0)
Total CK: 17747 U/L — ABNORMAL HIGH (ref 38–234)

## 2024-02-27 LAB — HEPATIC FUNCTION PANEL
ALT: 67 U/L — ABNORMAL HIGH (ref 0–44)
AST: 206 U/L — ABNORMAL HIGH (ref 15–41)
Albumin: 2.8 g/dL — ABNORMAL LOW (ref 3.5–5.0)
Alkaline Phosphatase: 58 U/L (ref 38–126)
Bilirubin, Direct: 0.2 mg/dL (ref 0.0–0.2)
Indirect Bilirubin: 0.8 mg/dL (ref 0.3–0.9)
Total Bilirubin: 1 mg/dL (ref 0.0–1.2)
Total Protein: 5.5 g/dL — ABNORMAL LOW (ref 6.5–8.1)

## 2024-02-27 LAB — ECHOCARDIOGRAM COMPLETE
AR max vel: 2.58 cm2
AV Area VTI: 2.47 cm2
AV Area mean vel: 2.53 cm2
AV Mean grad: 5 mmHg
AV Peak grad: 9.4 mmHg
Ao pk vel: 1.53 m/s
Area-P 1/2: 4.68 cm2
Calc EF: 65.3 %
Height: 63 in
S' Lateral: 2.9 cm
Single Plane A2C EF: 62.2 %
Single Plane A4C EF: 67.4 %
Weight: 2638.47 [oz_av]

## 2024-02-27 LAB — PROCALCITONIN: Procalcitonin: 0.97 ng/mL

## 2024-02-27 LAB — GLUCOSE, CAPILLARY
Glucose-Capillary: 102 mg/dL — ABNORMAL HIGH (ref 70–99)
Glucose-Capillary: 109 mg/dL — ABNORMAL HIGH (ref 70–99)
Glucose-Capillary: 111 mg/dL — ABNORMAL HIGH (ref 70–99)
Glucose-Capillary: 111 mg/dL — ABNORMAL HIGH (ref 70–99)
Glucose-Capillary: 91 mg/dL (ref 70–99)
Glucose-Capillary: 91 mg/dL (ref 70–99)

## 2024-02-27 LAB — MAGNESIUM: Magnesium: 1.6 mg/dL — ABNORMAL LOW (ref 1.7–2.4)

## 2024-02-27 LAB — CK
Total CK: 15886 U/L — ABNORMAL HIGH (ref 38–234)
Total CK: 17589 U/L — ABNORMAL HIGH (ref 38–234)
Total CK: 17747 U/L — ABNORMAL HIGH (ref 38–234)

## 2024-02-27 LAB — ACETAMINOPHEN LEVEL: Acetaminophen (Tylenol), Serum: <10 ug/mL — ABNORMAL LOW (ref 10–30)

## 2024-02-27 LAB — LACTIC ACID, PLASMA: Lactic Acid, Venous: 1.4 mmol/L (ref 0.5–1.9)

## 2024-02-27 MED ORDER — NALOXONE HCL 0.4 MG/ML IJ SOLN
0.4000 mg | Freq: Once | INTRAMUSCULAR | Status: AC
  Administered 2024-02-27: 0.4 mg via INTRAVENOUS
  Filled 2024-02-27: qty 1

## 2024-02-27 MED ORDER — LEVOTHYROXINE SODIUM 100 MCG/5ML IV SOLN
65.0000 ug | Freq: Every day | INTRAVENOUS | Status: DC
  Administered 2024-02-27: 65 ug via INTRAVENOUS
  Filled 2024-02-27: qty 5

## 2024-02-27 MED ORDER — LACTATED RINGERS IV BOLUS
1000.0000 mL | Freq: Once | INTRAVENOUS | Status: AC
  Administered 2024-02-27: 1000 mL via INTRAVENOUS

## 2024-02-27 MED ORDER — LEVOTHYROXINE SODIUM 100 MCG/5ML IV SOLN
75.0000 ug | Freq: Every day | INTRAVENOUS | Status: DC
  Filled 2024-02-27: qty 5

## 2024-02-27 MED ORDER — ORAL CARE MOUTH RINSE: 15.0000 mL | OROMUCOSAL | Status: DC | PRN

## 2024-02-27 MED ORDER — LACTATED RINGERS IV SOLN: INTRAVENOUS | Status: AC

## 2024-02-27 MED ORDER — POTASSIUM CHLORIDE 10 MEQ/100ML IV SOLN
10.0000 meq | INTRAVENOUS | Status: AC
  Administered 2024-02-27 – 2024-02-28 (×4): 10 meq via INTRAVENOUS
  Filled 2024-02-27 (×4): qty 100

## 2024-02-27 MED ORDER — POTASSIUM CHLORIDE 10 MEQ/100ML IV SOLN: 10.0000 meq | INTRAVENOUS | Status: DC

## 2024-02-27 MED ORDER — NALOXONE HCL 4 MG/10ML IJ SOLN
0.5000 mg/h | INTRAVENOUS | Status: DC
  Administered 2024-02-27 – 2024-02-28 (×4): 0.5 mg/h via INTRAVENOUS
  Filled 2024-02-27 (×4): qty 10

## 2024-02-27 MED ORDER — MAGNESIUM SULFATE 4 GM/100ML IV SOLN
4.0000 g | Freq: Once | INTRAVENOUS | Status: AC
  Administered 2024-02-27: 4 g via INTRAVENOUS
  Filled 2024-02-27: qty 100

## 2024-02-27 MED ORDER — POTASSIUM CHLORIDE 10 MEQ/100ML IV SOLN
10.0000 meq | INTRAVENOUS | Status: AC
  Administered 2024-02-27 (×6): 10 meq via INTRAVENOUS
  Filled 2024-02-27 (×6): qty 100

## 2024-02-27 NOTE — Progress Notes (Signed)
 Echocardiogram 2D Echocardiogram has been performed.  Takeysha Bonk N Leialoha Hanna,RDCS 02/27/2024, 8:20 AM

## 2024-02-27 NOTE — Progress Notes (Signed)
 NAME:  Valerie Davis, MRN:  [id number], DOB:  [birth year], LOS: 1 ADMISSION DATE:  02/26/2024, CONSULTATION DATE:  4/11 REFERRING MD:  Milon Aloe, CHIEF COMPLAINT:  shock and AMS   History of Present Illness:  83 year old female with history as mentioned below in her usual state of health up until about a week prior to presentation when she did have 2 to 3 days of abdominal discomfort and diarrhea.  Per family he was felt that this had resolved, she had returned to her typical state of health with the exception of having some hypertension with systolic blood pressure in the 200s on 4/10.  She had spoken with her family and the plan was to reach out to her primary care doctor about this on 4/11.  The patient's son called her on 4/11 and she did not answer the phone, this subsequently led to a wellness check, police went to the patient's house, accompanied now by the daughter who let the police in where they found the patient lying on the floor minimally responsive. She was administered Narcan by EMS She was hypothermic on arrival There was question of left-sided weakness and because of this a code stroke was called.  Of note he had dried blood surrounding her mouth, on her tongue, and in her nose.  CT angiogram was negative for large vessel occlusion, total CKs were elevated in the 700s then raising the question of possible overdose versus seizure she was additionally administered flumazenil, there has been no significant response to this.  An EEG was obtained that showed excessive beta activity with generalized slowing but no epileptiform discharges.  The patient remained hypotensive in the emergency room in spite of 3 L of crystalloid, because of her hypotension she was started on norepinephrine infusion.  Pulmonary was asked to admit for shock and altered mental status  Pertinent  Medical History  Htn, hypothyroidism, feso4 def anemia, gerd w/out  esophagitis, ckd stage 3b, depression, prior  GIB  Significant Hospital Events: Including procedures, antibiotic start and stop dates in addition to other pertinent events   4/11 admitted with altered mental status, etiology not clear, question postictal state versus overdose versus metabolic derangements.  Started on broad-spectrum antibiotics, culture sent.  DNR/DNI status confirmed 4/12 Evening of admission, related to clinical team management patient reported alcohol consumption night of admission with attempts for self-harm in the setting of longstanding intermittent suicidal ideations  Interim History / Subjective:  Will arouse to physical stimuli and is able to state name but quickly falls back to sleep without stimulation   Objective   Blood pressure 100/72, pulse 78, temperature 98.4 F (36.9 C), temperature source Axillary, resp. rate 18, height 5\' 3"  (1.6 m), weight 74.8 kg, SpO2 97%.        Intake/Output Summary (Last 24 hours) at 02/27/2024 0824 Last data filed at 02/27/2024 0700 Gross per 24 hour  Intake 5064.84 ml  Output 915 ml  Net 4149.84 ml   Filed Weights   02/26/24 1800 02/27/24 0500  Weight: 76.7 kg 74.8 kg    Examination: General: Acute on chronically ill appearing elderly female lying in bed on 2 L nasal cannula in no acute distress  HEENT: [place]/AT, MM pink/moist, PERRL,  Neuro: Awakes to physical stimuli, able to state first name but quickly falls back to sleep without stimulation CV: s1s2 regular rate and rhythm, no murmur, rubs, or gallops,  PULM: Shallow respirations, no increased work of breathing, no added breath sounds, on 2 L nasal cannula  GI: soft, bowel sounds active in all 4 quadrants, non-tender, non-distended Extremities: warm/dry, no edema  Skin: no rashes or lesions  Resolved Hospital Problem list     Assessment & Plan:  Acute metabolic encephalopathy in the setting of intentional drug overdose -Evening of admission, related to clinical team management patient reported alcohol  consumption night of admission with attempts for self-harm in the setting of longstanding intermittent suicidal ideations -Received second dose of Narcan with mild to moderate response overnight, mentation remains poor this a.m. P: Start Narcan drip Neuroprotective measures Seizure precautions Delirium Needs aspiration precautions Psych consult when mentation allows  Hypovolemic shock with underlying rhabdomyolysis -Initially presented with signs of undifferentiated shock minimal to no response to IV hydration.  Overnight vasopressor support weaned off and patient remains hemodynamically stable P: Additional LR bolus Increase LR drip Hold home antihypertensives Follow renal function Continue empiric ceftriaxone, stop IV Flagy  Acute hypoxic respiratory failure secondary to likely aspiration pneumonia versus CAP -Chest x-ray on admission with left mid to lower lung opacity concerning for CAP versus pneumonia P: Aspiration precautions Continue supplemental oxygen Continue IV ceftriaxone N.p.o. until mentation  Hypokalemia Hypomagnesemia P: Supplement as needed Trend Bement  History of hypothyroidism P: IV Synthroid   At risk for AKI.  Has history of CKD stage IIIb Rhabdomyolysis -CK 781 on admission with uptrend to 15,886 by a.m 4/12 P: Follow renal function  Monitor urine output Trend Bmet Avoid nephrotoxins Ensure adequate renal perfusion  Increase IV hydration  Hematemesis -Patient was found on the floor, unsure observed blood on face on EMS arrival was secondary to trauma or hematic emesis P: Continue twice daily PPI Trend CBC Monitor for signs of bleeding  Hyperglycemia P: Continue SSI CBG goal 140-180 CBG checks every 4  Best Practice (right click and "Reselect all SmartList Selections" daily)   Diet/type: NPO w/ oral meds DVT prophylaxis SCD Pressure ulcer(s): N/A GI prophylaxis: PPI Lines: N/A Foley:  Yes, and it is still needed Code Status:   DNR Last date of multidisciplinary goals of care discussion [wants medical care ]  Critical care time:   CRITICAL CARE Performed by: Chancelor Hardrick D. Harris  Total critical care time: 38 minutes  Critical care time was exclusive of separately billable procedures and treating other patients.  Critical care was necessary to treat or prevent imminent or life-threatening deterioration.  Critical care was time spent personally by me on the following activities: development of treatment plan with patient and/or surrogate as well as nursing, discussions with consultants, evaluation of patient's response to treatment, examination of patient, obtaining history from patient or surrogate, ordering and performing treatments and interventions, ordering and review of laboratory studies, ordering and review of radiographic studies, pulse oximetry and re-evaluation of patient's condition.  Manasa Spease D. Harris, NP-C [facility] Personal contact information can be found on Amion  If no contact or response made please call [phone] 02/27/2024, 8:45 AM

## 2024-02-27 NOTE — Progress Notes (Signed)
 Discussed with CCM, patient woke up enough to admit SI and overdose. Will be available as needed, please call with furhter questions or concerns.   Ann Keto, MD [facility]   If 7pm- 7am, please page neurology on call as listed in AMION.

## 2024-02-27 NOTE — Progress Notes (Addendum)
 Living will brought in by patients daughter Barbra Ley. Copied and placed in chart.   Lauren is also the primary care giver for her father (patients husband?) who has alzheimer's dementia and resides in [place].   Lauren returned after going to patients home looking for "clues to what happened" and to find patients living will and other documents. Daughter states she didn't find any medications or pill bottles that didn't belong to patient.  States that patient has been giving belongings away, freezing all perishable food, and wrote a note with a check for half a months rent and specified in the note to "use the deposit to pay for the remainder of the rent".  Daughter states that the note also specified disposition of personal belongings.   Updated 2nd daughter Allison's contact information in computer. Margretta Shi lives in [place]. Lauren states that she "hasn't talked to her Margretta Shi) in 20 years"  Dauhgter Barbra Ley updated by Laurina Popper (critical care NP) regarding plan of care and current condition.

## 2024-02-27 NOTE — Progress Notes (Signed)
 eLink Physician-Brief Progress Note Patient Name: Valerie Davis DOB: [birth year] MRN: [id number]   Date of Service  02/27/2024  HPI/Events of Note  Patient with suspected poly drug overdose  with somnolence.  eICU Interventions  Narcan ordered.        Ashar Lewinski U Brittin Belnap 02/27/2024, 6:18 AM

## 2024-02-27 NOTE — Progress Notes (Signed)
 [facility] ADULT ICU REPLACEMENT PROTOCOL   The patient does apply for the [facility] Adult ICU Electrolyte Replacment Protocol based on the criteria listed below:   1.Exclusion criteria: TCTS, ECMO, Dialysis, and Myasthenia Gravis patients 2. Is GFR >/= 30 ml/min? Yes.    Patient's GFR today is 50 3. Is SCr </= 2? Yes.   Patient's SCr is 1.11 mg/dL 4. Did SCr increase >/= 0.5 in 24 hours? No. 5.Pt's weight >40kg  Yes.   6. Abnormal electrolyte(s): K+ 3.2, Mg 1.6  7. Electrolytes replaced per protocol 8.  Call MD STAT for K+ </= 2.5, Phos </= 1, or Mag </= 1 Physician:  Dr Tacy Expose, Trellis Fries 02/27/2024 4:24 AM

## 2024-02-27 NOTE — Progress Notes (Signed)
 PCCM Interval Note  Family meeting held with daughter and patient's brother-in-law. Discussed GOC in setting of patient's suicide attempt. Psych consult has been placed.  Daughter reports patient has been DNR/DNI since 2004 and this is not a new decision. Patient has had poor quality of life for many years related to arthritic pain, hx IDA and GI bleeds, spinal stenosis, fatigue and pain.  We discussed plan to continue current medical care at this time including vasopressors, fluids, antibiotics and oxygen support as indicated with the goal of reversing any causes that can be treated. This is what patient also agreed with at time of admission.   Regarding her code status we have discussed and agreed to honor her DNR/DNI as this is felt to have been decided prior to her suicide attempt and consistent with her goals in the past regarding her medical care.   Provided emotional support to family as this hospitalization has been stressful for family with the current information.  Additional time: 30 min  Valerie Davis, M.D. [facility]monary[facility]Critical Care Medicine 02/27/2024 3:46 PM   See Amion for personal pager For hours between 7 PM to 7 AM, please call Elink for urgent questions

## 2024-02-27 NOTE — Progress Notes (Signed)
 Narcan administered at 0630 per [facility] provider. Patient showed an improved response within 10 minutes and continues to become more alert. Will continue to monitor response.

## 2024-02-27 NOTE — Progress Notes (Signed)
 PCCP Progress Note   Notified by RN that patients daughter has requested an update from medical team. Called and spoke to [employer] and provided update.   After relaying information and answering all questions daughter relayed additional history. She reports that she went to patient's home to look for medicine/pill bottles at which time she found evidence to confirm patient's suicide attempt.  She states she found a detailed suicide note that was written at 3:30 AM that appears very coherent and detailed, found evidence that patient had written down wishes for allocation of possessions, set aside rent money for the rest the month, and prepped with food for daughter that was in the refrigerator.  Daughter is unsure what medications/drugs/substances that mother may have taken.  She reports that patient struggles with chronic pain at baseline and leads a very unfulfilled life.  Psych consulted placed and will reach out to poison control   Valerie Costanzo D. Harris, NP-C [facility] Personal contact information can be found on Amion  If no contact or response made please call [phone] 02/27/2024, 2:56 PM

## 2024-02-28 DIAGNOSIS — M6282 Rhabdomyolysis: Secondary | ICD-10-CM | POA: Diagnosis not present

## 2024-02-28 DIAGNOSIS — J9601 Acute respiratory failure with hypoxia: Secondary | ICD-10-CM | POA: Diagnosis not present

## 2024-02-28 DIAGNOSIS — R571 Hypovolemic shock: Secondary | ICD-10-CM | POA: Diagnosis not present

## 2024-02-28 DIAGNOSIS — G9341 Metabolic encephalopathy: Secondary | ICD-10-CM | POA: Diagnosis not present

## 2024-02-28 LAB — MAGNESIUM
Magnesium: 2 mg/dL (ref 1.7–2.4)
Magnesium: 1.6 mg/dL — ABNORMAL LOW (ref 1.7–2.4)

## 2024-02-28 LAB — GLUCOSE, CAPILLARY
Glucose-Capillary: 120 mg/dL — ABNORMAL HIGH (ref 70–99)
Glucose-Capillary: 81 mg/dL (ref 70–99)
Glucose-Capillary: 86 mg/dL (ref 70–99)
Glucose-Capillary: 87 mg/dL (ref 70–99)
Glucose-Capillary: 95 mg/dL (ref 70–99)
Glucose-Capillary: 96 mg/dL (ref 70–99)

## 2024-02-28 LAB — BASIC METABOLIC PANEL WITH GFR
Anion gap: 11 (ref 5–15)
BUN: 10 mg/dL (ref 8–23)
CO2: 23 mmol/L (ref 22–32)
Calcium: 8.6 mg/dL — ABNORMAL LOW (ref 8.9–10.3)
Chloride: 106 mmol/L (ref 98–111)
Creatinine, Ser: 0.87 mg/dL (ref 0.44–1.00)
GFR, Estimated: >60 mL/min (ref >60)
Glucose, Bld: 107 mg/dL — ABNORMAL HIGH (ref 70–99)
Potassium: 3.8 mmol/L (ref 3.5–5.1)
Sodium: 140 mmol/L (ref 135–145)

## 2024-02-28 LAB — CK TOTAL AND CKMB (NOT AT ARMC)
CK, MB: 168.5 ng/mL — ABNORMAL HIGH (ref 0.5–5.0)
CK, MB: 83.5 ng/mL — ABNORMAL HIGH (ref 0.5–5.0)
CK, MB: 57.7 ng/mL — ABNORMAL HIGH (ref 0.5–5.0)
Total CK: 19343 U/L — ABNORMAL HIGH (ref 38–234)
Total CK: 12462 U/L — ABNORMAL HIGH (ref 38–234)
Total CK: 11497 U/L — ABNORMAL HIGH (ref 38–234)

## 2024-02-28 MED ORDER — ENOXAPARIN SODIUM 40 MG/0.4ML IJ SOSY
40.0000 mg | PREFILLED_SYRINGE | INTRAMUSCULAR | Status: DC
  Administered 2024-02-28 – 2024-03-03 (×5): 40 mg via SUBCUTANEOUS
  Filled 2024-02-28 (×5): qty 0.4

## 2024-02-28 MED ORDER — POTASSIUM CHLORIDE CRYS ER 20 MEQ PO TBCR
20.0000 meq | EXTENDED_RELEASE_TABLET | Freq: Once | ORAL | Status: AC
  Administered 2024-02-28: 20 meq via ORAL
  Filled 2024-02-28: qty 1

## 2024-02-28 MED ORDER — MAGNESIUM SULFATE 2 GM/50ML IV SOLN
2.0000 g | Freq: Once | INTRAVENOUS | Status: AC
  Administered 2024-02-28: 2 g via INTRAVENOUS
  Filled 2024-02-28: qty 50

## 2024-02-28 MED ORDER — ACETAMINOPHEN 325 MG PO TABS
650.0000 mg | ORAL_TABLET | Freq: Four times a day (QID) | ORAL | Status: DC | PRN
  Administered 2024-02-29 – 2024-03-04 (×4): 650 mg via ORAL
  Filled 2024-02-28 (×4): qty 2

## 2024-02-28 MED ORDER — PANTOPRAZOLE SODIUM 40 MG PO TBEC
40.0000 mg | DELAYED_RELEASE_TABLET | Freq: Two times a day (BID) | ORAL | Status: DC
  Administered 2024-02-28 – 2024-03-04 (×10): 40 mg via ORAL
  Filled 2024-02-28 (×10): qty 1

## 2024-02-28 MED ORDER — LACTATED RINGERS IV SOLN: INTRAVENOUS | Status: DC

## 2024-02-28 MED ORDER — LEVOTHYROXINE SODIUM 88 MCG PO TABS
88.0000 ug | ORAL_TABLET | Freq: Every day | ORAL | Status: DC
  Administered 2024-02-29 – 2024-03-04 (×4): 88 ug via ORAL
  Filled 2024-02-28 (×5): qty 1

## 2024-02-28 MED ORDER — PHENOL 1.4 % MT LIQD
1.0000 | OROMUCOSAL | Status: DC | PRN
  Administered 2024-02-29: 1 via OROMUCOSAL
  Filled 2024-02-28 (×2): qty 177

## 2024-02-28 NOTE — Progress Notes (Signed)
 NAME:  Valerie Davis, MRN:  [id number], DOB:  1941/02/18, LOS: 2 ADMISSION DATE:  02/26/2024, CONSULTATION DATE:  4/11 REFERRING MD:  Milon Aloe, CHIEF COMPLAINT:  shock and AMS   History of Present Illness:  83 year old female with history as mentioned below in her usual state of health up until about a week prior to presentation when she did have 2 to 3 days of abdominal discomfort and diarrhea.  Per family he was felt that this had resolved, she had returned to her typical state of health with the exception of having some hypertension with systolic blood pressure in the 200s on 4/10.  She had spoken with her family and the plan was to reach out to her primary care doctor about this on 4/11.  The patient's son called her on 4/11 and she did not answer the phone, this subsequently led to a wellness check, police went to the patient's house, accompanied now by the daughter who let the police in where they found the patient lying on the floor minimally responsive. She was administered Narcan by EMS She was hypothermic on arrival There was question of left-sided weakness and because of this a code stroke was called.  Of note he had dried blood surrounding her mouth, on her tongue, and in her nose.  CT angiogram was negative for large vessel occlusion, total CKs were elevated in the 700s then raising the question of possible overdose versus seizure she was additionally administered flumazenil, there has been no significant response to this.  An EEG was obtained that showed excessive beta activity with generalized slowing but no epileptiform discharges.  The patient remained hypotensive in the emergency room in spite of 3 L of crystalloid, because of her hypotension she was started on norepinephrine infusion.  Pulmonary was asked to admit for shock and altered mental status  Pertinent  Medical History  Htn, hypothyroidism, feso4 def anemia, gerd w/out  esophagitis, ckd stage 3b, depression, prior  GIB  Significant Hospital Events: Including procedures, antibiotic start and stop dates in addition to other pertinent events   4/11 admitted with altered mental status, etiology not clear, question postictal state versus overdose versus metabolic derangements.  Started on broad-spectrum antibiotics, culture sent.  DNR/DNI status confirmed 4/12 Evening of admission, related to clinical team management patient reported alcohol consumption night of admission with attempts for self-harm in the setting of longstanding intermittent suicidal ideations 4/13 alert and interactive this a.m. on low-dose Narcan drip.  CK continues to rise up to 19,343 this a.m. but renal function improved, creatinine 0.87 with good urine output  Interim History / Subjective:  Alert and oriented x 3, reports attempted suicide denies any current suicidal ideations  Objective   Blood pressure 119/78, pulse 99, temperature 98.7 F (37.1 C), temperature source Oral, resp. rate (!) 21, height 5\' 3"  (1.6 m), weight 74.8 kg, SpO2 (!) 89%.        Intake/Output Summary (Last 24 hours) at 02/28/2024 1052 Last data filed at 02/28/2024 0800 Gross per 24 hour  Intake 5842.72 ml  Output 4300 ml  Net 1542.72 ml   Filed Weights   02/26/24 1800 02/27/24 0500 02/28/24 0303  Weight: 76.7 kg 74.8 kg 74.8 kg    Examination: General: Acute on chronic ill-appearing deconditioned elderly female lying in bed in no acute distress HEENT: [place]/AT, MM pink/moist, PERRL,  Neuro: Alert and oriented x 3, nonfocal CV: s1s2 regular rate and rhythm, no murmur, rubs, or gallops,  PULM: Clear to auscultation bilaterally,  no increased work of breathing, no added breath sounds GI: soft, bowel sounds active in all 4 quadrants, non-tender, non-distended Extremities: warm/dry, no edema  Skin: no rashes or lesions  Resolved Hospital Problem list   Hypovolemic shock Hypokalemia Hypomagnesemia Hyperglycemia  Assessment & Plan:  Acute metabolic  encephalopathy in the setting of intentional drug overdose with suicide attempt -Patient reports suicide attempt with intentional drug overdose night of admission unable to state what medications she took P: Stop Narcan drip Monitor mentation and ICU if she remains alert and interactive will transfer out of ICU today Psych consult pending Seizure precautions Aspiration precautions One-to-one sitter  Rhabdomyolysis -Initially presented with signs of undifferentiated shock minimal to no response to IV hydration, pressor support quickly stopped after IV hydration -CK total has yet to peak as of a.m. 4/13 CK is up to 19,343 Reported history of CKD stage III  -Renal function normal with creatinine 0.87 and GFR greater than 60 a.m. 4/13 P: Continue to trend CK Continue IV hydration Strict intake and output Follow renal function  Acute hypoxic respiratory failure secondary to likely aspiration pneumonia versus CAP -Chest x-ray on admission with left mid to lower lung opacity concerning for CAP versus pneumonia P: Continue empiric ceftriaxone x 5 days Aspiration precautions Encourage adequate pulmonary hygiene Mobilize  History of hypothyroidism P: Continue Synthroid  Hematemesis -Patient was found on the floor, unsure observed blood on face on EMS arrival was secondary to trauma or hematic emesis P: Continue home PPI  If mentation remains appropriate off Narcan drip will transfer out of ICU into hospitalist starting 4/14  Best Practice (right click and "Reselect all SmartList Selections" daily)   Diet/type: NPO w/ oral meds DVT prophylaxis SCD Pressure ulcer(s): N/A GI prophylaxis: PPI Lines: N/A Foley:  Yes, and it is still needed Code Status:  DNR Last date of multidisciplinary goals of care discussion [wants medical care ]  Critical care time:  NA   Kay Shippy D. Harris, NP-C [facility] Personal contact information can be found on Amion  If no  contact or response made please call [phone] 02/28/2024, 10:52 AM

## 2024-02-28 NOTE — Evaluation (Signed)
 Physical Therapy Evaluation Patient Details Name: Valerie Davis MRN: [id number] DOB: 02/10/1941 Today's Date: 02/28/2024  History of Present Illness  The pt is an 83 yo female presenting 4/11 after being found down at home. Pt obtunded with AMS, improved with Flumanzenil. Pt admitted for management of acute encephalopathy due to overdose. PMH includes: arthritis, ADD, HTN, GIB, CKD III, and depression.   Clinical Impression  Pt in bed upon arrival of PT, agreeable to evaluation at this time. Prior to admission the pt reports she was independent with all mobility without need for DME, reports no other falls or assist needed for ADLs. The pt required minA to complete bed mobility and initial sit-stand transfers this session with use of RW for balance. She was then able to complete standing marches and short bout of ambulation in the room with minA and chair follow but had no overt buckling, LOB, or need for assistance. The pt was able to follow all commands well in session, and demo good safety awareness. She will need continued skilled PT acutely to progress walking distance and complete stair navigation prior to return home, but anticipate she will be able to progress to d/c home with family support once medically stable.     If plan is discharge home, recommend the following: A little help with walking and/or transfers;A little help with bathing/dressing/bathroom;Assistance with cooking/housework;Assistance with feeding;Direct supervision/assist for medications management;Direct supervision/assist for financial management;Assist for transportation;Help with stairs or ramp for entrance;Supervision due to cognitive status   Can travel by private vehicle        Equipment Recommendations Rolling walker (2 wheels);BSC/3in1  Recommendations for Other Services       Functional Status Assessment Patient has had a recent decline in their functional status and demonstrates the ability to make significant  improvements in function in a reasonable and predictable amount of time.     Precautions / Restrictions Precautions Precautions: Fall Recall of Precautions/Restrictions: Intact Restrictions Weight Bearing Restrictions Per Provider Order: No      Mobility  Bed Mobility Overal bed mobility: Needs Assistance Bed Mobility: Rolling, Sidelying to Sit Rolling: Min assist Sidelying to sit: Min assist       General bed mobility comments: minA to elevate trunk and scoot hips to EOB    Transfers Overall transfer level: Needs assistance Equipment used: Rolling walker (2 wheels) Transfers: Sit to/from Stand, Bed to chair/wheelchair/BSC Sit to Stand: Min assist   Step pivot transfers: Min assist       General transfer comment: minA to steady, assist with movement of RW. manage lines    Ambulation/Gait Ambulation/Gait assistance: Min assist Gait Distance (Feet): 8 Feet Assistive device: Rolling walker (2 wheels) Gait Pattern/deviations: Step-through pattern, Decreased stride length Gait velocity: decreased Gait velocity interpretation: <1.31 ft/sec, indicative of household ambulator   General Gait Details: slowed steps with minimal clearance dependent on UE support, VSS    Balance Overall balance assessment: Needs assistance Sitting-balance support: No upper extremity supported Sitting balance-Leahy Scale: Good     Standing balance support: Bilateral upper extremity supported, During functional activity Standing balance-Leahy Scale: Fair Standing balance comment: dependent on UE support and minA at times to steady                             Pertinent Vitals/Pain Pain Assessment Pain Assessment: 0-10 Pain Score: 5  Pain Location: L shoulder Pain Descriptors / Indicators: Aching, Discomfort Pain Intervention(s): Limited activity within patient's  tolerance, Monitored during session, Repositioned    Home Living Family/patient expects to be discharged to::  Private residence Living Arrangements: Alone Available Help at Discharge: Family;Available PRN/intermittently Type of Home: House Home Access: Stairs to enter Entrance Stairs-Rails: None Entrance Stairs-Number of Steps: 3   Home Layout: One level   Additional Comments: no family present to confirm    Prior Function Prior Level of Function : Independent/Modified Independent;Driving             Mobility Comments: pt reports independence without use of DME ADLs Comments: pt reports independence     Extremity/Trunk Assessment   Upper Extremity Assessment Upper Extremity Assessment: Defer to OT evaluation    Lower Extremity Assessment Lower Extremity Assessment: Generalized weakness    Cervical / Trunk Assessment Cervical / Trunk Assessment: Kyphotic  Communication   Communication Communication: No apparent difficulties    Cognition Arousal: Alert Behavior During Therapy: WFL for tasks assessed/performed   PT - Cognitive impairments: No family/caregiver present to determine baseline                       PT - Cognition Comments: pt following simple commands with increased time. cues for sequencing. not formally assessed Following commands: Intact       Cueing Cueing Techniques: Verbal cues     General Comments General comments (skin integrity, edema, etc.): VSS on 2L, SpO2 93-96%        Assessment/Plan    PT Assessment Patient needs continued PT services  PT Problem List Decreased strength;Decreased range of motion;Decreased activity tolerance;Decreased balance;Decreased mobility;Decreased coordination;Decreased cognition       PT Treatment Interventions DME instruction;Gait training;Stair training;Functional mobility training;Therapeutic activities;Therapeutic exercise;Balance training;Patient/family education;Cognitive remediation    PT Goals (Current goals can be found in the Care Plan section)  Acute Rehab PT Goals Patient Stated Goal:  return home PT Goal Formulation: With patient/family Time For Goal Achievement: 03/13/24 Potential to Achieve Goals: Good    Frequency Min 2X/week        AM-PAC PT "6 Clicks" Mobility  Outcome Measure Help needed turning from your back to your side while in a flat bed without using bedrails?: A Little Help needed moving from lying on your back to sitting on the side of a flat bed without using bedrails?: A Lot Help needed moving to and from a bed to a chair (including a wheelchair)?: A Lot Help needed standing up from a chair using your arms (e.g., wheelchair or bedside chair)?: A Little Help needed to walk in hospital room?: A Lot (<20 FT) Help needed climbing 3-5 steps with a railing? : A Lot 6 Click Score: 14    End of Session Equipment Utilized During Treatment: Gait belt;Oxygen Activity Tolerance: Patient tolerated treatment well;Patient limited by fatigue Patient left: in chair;with call bell/phone within reach;with chair alarm set Nurse Communication: Mobility status PT Visit Diagnosis: Unsteadiness on feet (R26.81);Muscle weakness (generalized) (M62.81);Other abnormalities of gait and mobility (R26.89)    Time: 1610-9604 PT Time Calculation (min) (ACUTE ONLY): 36 min   Charges:   PT Evaluation $PT Eval Moderate Complexity: 1 Mod PT Treatments $Therapeutic Activity: 8-22 mins PT General Charges $$ ACUTE PT VISIT: 1 Visit         Barnabas Booth, PT, DPT   Acute Rehabilitation Department Office [phone] Secure Chat Communication Preferred  Valerie Davis 02/28/2024, 5:02 PM

## 2024-02-28 NOTE — Progress Notes (Signed)
 eLink Physician-Brief Progress Note Patient Name: Valerie Davis DOB: 05-23-1941 MRN: [id number]   Date of Service  02/28/2024  HPI/Events of Note  40F with HTN, IDA, hx GIB, CKD IIIB with recent diarrhea for 3 days who was found down on wellness check. EMS gave Narcan. In the ED code stroke called but work-up neg for stroke. Spot EEG neg  Had a increase in her CK this morning, remains on appropriate IV fluids.  ECG shows more pronounced left bundle branch block physiology. CK-MB also elevated.  Previous troponin negative.  eICU Interventions  Overall hemodynamics seem to have improved  For now, no intervention.     Intervention Category Intermediate Interventions: Hypotension - evaluation and management  Rozina Pointer 02/28/2024, 5:13 AM

## 2024-02-28 NOTE — Plan of Care (Signed)
  Problem: Coping: Goal: Ability to adjust to condition or change in health will improve Outcome: Progressing   Problem: Health Behavior/Discharge Planning: Goal: Ability to identify and utilize available resources and services will improve Outcome: Progressing   Problem: Metabolic: Goal: Ability to maintain appropriate glucose levels will improve Outcome: Progressing   Problem: Skin Integrity: Goal: Risk for impaired skin integrity will decrease Outcome: Progressing   Problem: Tissue Perfusion: Goal: Adequacy of tissue perfusion will improve Outcome: Progressing   Problem: Nutrition: Goal: Adequate nutrition will be maintained Outcome: Progressing   Problem: Elimination: Goal: Will not experience complications related to bowel motility Outcome: Progressing

## 2024-02-28 NOTE — Plan of Care (Signed)
  Problem: Metabolic: Goal: Ability to maintain appropriate glucose levels will improve Outcome: Progressing   Problem: Skin Integrity: Goal: Risk for impaired skin integrity will decrease Outcome: Progressing   Problem: Tissue Perfusion: Goal: Adequacy of tissue perfusion will improve Outcome: Progressing   Problem: Clinical Measurements: Goal: Ability to maintain clinical measurements within normal limits will improve Outcome: Progressing Goal: Respiratory complications will improve Outcome: Progressing Goal: Cardiovascular complication will be avoided Outcome: Progressing   Problem: Activity: Goal: Risk for activity intolerance will decrease Outcome: Progressing

## 2024-02-28 NOTE — Consult Note (Signed)
 [facility] Psychiatric Consult Initial  Patient Name: [name]  MRN: [id number]  DOB: [birth year]  Consult Order details:  Orders (From admission, onward)     Start     Ordered   02/27/24 1439  IP CONSULT TO PSYCHIATRY       Comments: Urgent consult please call Deneise Finlay [phone] or secure chat for details  Ordering Provider: Enrico Hartshorn, NP  Provider:  (Not yet assigned)  Question Answer Comment  Location [facility] ([place])   Reason for Consult? Suicide attempt      02/27/24 1445             Mode of Visit: In person    Psychiatry Consult Evaluation  Service Date: February 28, 2024 LOS:  LOS: 2 days  Chief Complaint "I overdosed"  Primary Psychiatric Diagnoses  Major Depression, severe without psychosis 2.  GAD  Assessment  Valerie Davis is a 83 y.o. female admitted: Medicallyfor 02/26/2024 12:35 PM for suicide attempt. She carries the psychiatric diagnoses of Depression and Anxiety and has a past medical history of HTN, IDA, hx GIB, CKD IIIB with recent diarrhea for 3 days who was found down on wellness check. EMS gave Narcan. In the ED code stroke called but work-up neg for stroke. Spot EEG neg. Required levophed for hypotension and given 3L IVF. PCCM consulted for admission.   Her current presentation of an ongoing depressive symptoms with feelings of hopelesssnes and helplessness that eventually led to an intentional overdose in a suicide attempt is consistent with Major Depressive Disorder. Patient has significant history of depression and anxiety and is currently on Cymbalta. She is currently not being followed by a psychiatrist. Her symptoms have been associated with significant distress, decrease in physical activity possibly due to aging process vs multiple health issues (medical and personal care) , impaired social functioning, and financial difficulty.   On interview, patient presents alert and oriented x 4. Conversation was largely logical  and goal directed. Her stressors appears to be long term issue. She remains passively suicidal and currently without plan or intent, however her worsening depression and feelings of hopelessness are concerning and placed her at a chronically elevated risk of suicide which may be mitigated by inpatient psychiatric treatment at this time.  She has limited protective factors and may likely make another suicide attempt.  She is not amenable to treatment at this time, and may be discharged home to her daughter with safety plan. We will reassess her for safety once medically cleared.   Diagnoses:  Active Hospital problems: Principal Problem:   Acute encephalopathy    Plan   ## Psychiatric Medication Recommendations:  -- We recommend inpatient psychiatric hospitalization when medically cleared. Patient is under voluntary admission status at this time; please IVC if attempts to leave hospital.  ## Medical Decision Making Capacity: Not specifically addressed in this encounter  ## Further Work-up:  -- most recent EKG on 4/13 had QtC of 521 -- Pertinent labwork reviewed earlier this admission includes: 4/13- CK- 19,343, creatinine- elevated  LFT,    ## Disposition:-- We recommend inpatient psychiatric hospitalization when medically cleared. Patient is under voluntary admission status at this time; please IVC if attempts to leave hospital.  ## Behavioral / Environmental: -Delirium Precautions: Delirium Interventions for Nursing and Staff: - RN to open blinds every AM. - To Bedside: Glasses, hearing aide, and pt's own shoes. Make available to patients. when possible and encourage use. - Encourage po fluids when appropriate, keep  fluids within reach. - OOB to chair with meals. - Passive ROM exercises to all extremities with AM & PM care. - RN to assess orientation to person, time and place QAM and PRN. - Recommend extended visitation hours with familiar family/friends as feasible. - Staff to minimize  disturbances at night. Turn off television when pt asleep or when not in use.    ## Safety and Observation Level:  - Based on my clinical evaluation, I estimate the patient to be at high risk of self harm in the current setting. - At this time, we recommend  1:1 Observation. This decision is based on my review of the chart including patient's history and current presentation, interview of the patient, mental status examination, and consideration of suicide risk including evaluating suicidal ideation, plan, intent, suicidal or self-harm behaviors, risk factors, and protective factors. This judgment is based on our ability to directly address suicide risk, implement suicide prevention strategies, and develop a safety plan while the patient is in the clinical setting. Please contact our team if there is a concern that risk level has changed.  CSSR Risk Category:C-SSRS RISK CATEGORY: High Risk  Suicide Risk Assessment: Patient has following modifiable risk factors for suicide: active suicidal ideation, under treated depression , and social isolation, which we are addressing by continuing observatin. Patient has following non-modifiable or demographic risk factors for suicide: separation or divorce and history of suicide attempt Patient has the following protective factors against suicide: Supportive family  Thank you for this consult request. Recommendations have been communicated to the primary team.  We will follow up at this time.   Garnett Justice, NP       History of Present Illness  Relevant Aspects of Hospital Hospital Course: Valerie Davis is a 83 y.o. female admitted: Medicallyfor 02/26/2024 12:35 PM for suicide attempt. She carries the psychiatric diagnoses of Depression and Anxiety and has a past medical history of HTN, IDA, hx GIB, CKD IIIB with recent diarrhea for 3 days who was found down on wellness check. EMS gave Narcan. In the ED code stroke called but work-up neg for stroke. Spot EEG  neg. Required levophed for hypotension and given 3L IVF. PCCM consulted for admission.  Admitted on 02/26/2024 for medical overdose in a suicide attempt.    Patient Report:  Patient found in bed with sitter and daughter in the room. Patient was interviewed in the absence of her daughter who wanted to give her privacy. Patient describes her presenting problem as " I overdosed." Patient states that she took multiple unknown medication, including an old prescription of oxycodone. She was unable to quantify. She felt that she was "ready to go, I'm 82 and tired." Notes that she has been having suicidal thought for many months but unable to states what precipitated thjis particular event. Did not remember speaking to her brother the night before or writing a suicide letter. This is her second suicide attempt. The first one was "many years ago" when she was with her husband. She describes the marriage as "toxic" patient also took medication overdose but did not seek help. Currently describes her mood as "hopeless and totally helpless." She feels disappointed that this suicide attempt was unsuccessful and would like. Currently denies wanting to attempt suicide again because she no longer has medication and she is afraid of being unsuccessful again.   Patient sates that she lives alone without support from anyone and has no social avenue except going to the grocery store for shopping.  She feels overwhelmed with day to activities as she is often physically exhausted. Patent aslo mentions that she is experinecing financial difficulties. She gets about 1400 a month, house rent his 900 leaving her with little to cover her utility bills and grocery. She has been divorced for about 15 years and has two adult daughters.   Psych ROS:  Depression: Hopeless and "totally helpless," difficulty maintaining sleep Anxiety:  Feelings of restlessness, constant worry about caring for self Mania (lifetime and current): Denies mood  liability Psychosis: (lifetime and current): Denies hallucinations in all modalities  Collateral information:  Contacted Daughter was present for this collateral, she reported that's she was out of town when when her uncle notified her that he was unable to reach her mother. Valerie Davis stated that her uncle had spoken with mom the night before an she was supposed to call him the next morning. They decided to have a wellness check up on her when they did not hear from her. She reported that her mother suffers from depression. Described her family as "dysfunctional" and they have all been in therapy over there years. She has not spoken to her sister in 26 years until yesterday when she had to speak with her about their mother.  Daughter noted that when she returned to the house after mother was broughtt to the hospital, she found a detailed letter which appered to be a suicide note. The letter was addressed to her uncle and content of the letter has a detailed instruction on how to pay her outstanding bills, with account information, cash addressed to the landlord attached to the note. Daughter noted that her mother has a DNR and she wanted her mother's wishes to be carried out.   ROS   Psychiatric and Social History  Psychiatric History:  Information collected from patient, chart review, and daughter  Prev Dx/Sx: Depression and Anxiety Current Psych Provider: None Home Meds (current): Cymbalta, Xanax, Adderrall Previous Med Trials: unknown Therapy: Over the years, but not currently  Prior Psych Hospitalization: Patient denies  Prior Self Harm: Hx of medication overdose "many years ago" Prior Violence: denies  Family Psych History: depression with unknown detalis Family Hx suicide: denies  Social History:  Developmental Hx: WNL Educational Hx: COLLEGE Occupational Hx: Retired Nutritional therapist Hx: none Living Situation: Lives alone Spiritual Hx: Believe in God Access to weapons/lethal means:  denies owning or having access to a gun.   Substance History Alcohol: vodka, once a week or less  Type of alcohol -vodka  Last Drink -about a week ago Number of drinks per day- n/a History of alcohol withdrawal seizures - denies History of DT's - denies Tobacco: denies Illicit drugs: denies Prescription drug abuse: denies Rehab hx: Denies  Exam Findings  Physical Exam:  Vital Signs:  Temp:  [97.9 F (36.6 C)-99.1 F (37.3 C)] 98.7 F (37.1 C) (04/13 0730) Pulse Rate:  [62-99] 95 (04/13 0700) Resp:  [14-24] 15 (04/13 0700) BP: (109-150)/(57-110) 128/57 (04/13 0700) SpO2:  [91 %-99 %] 96 % (04/13 0700) Weight:  [74.8 kg] 74.8 kg (04/13 0303) Blood pressure (!) 128/57, pulse 95, temperature 98.7 F (37.1 C), temperature source Oral, resp. rate 15, height 5\' 3"  (1.6 m), weight 74.8 kg, SpO2 96%. Body mass index is 29.21 kg/m.  Physical Exam  Mental Status Exam: General Appearance: dressed in hospital gown , appears stated age  Orientation:  Full (Time, Place, and Person)  Memory:  Immediate;   Fair  Concentration:  Concentration: Fair  Recall:  Fair  Attention  Fair  Eye Contact:  Good  Speech:  Clear and Coherent  Language:  Good  Volume:  Normal  Mood: 'hopeless and totally helpless"  Affect:  Congruent and Depressed  Thought Process:  Goal Directed and Linear  Thought Content:  Logical  Suicidal Thoughts:   passive "disappointed to be alive"  Homicidal Thoughts:  No  Judgement:  Intact  Insight:  Present  Psychomotor Activity:  Decreased  Akathisia:  No  Fund of Knowledge:  Fair      Assets:  Community education officer  Cognition:  WNL  ADL's:  Intact  AIMS (if indicated):        Other History   These have been pulled in through the EMR, reviewed, and updated if appropriate.  Family History:  The patient's family history includes Cancer in her sister; Heart attack (age of onset: 80) in her father; Stomach cancer in her maternal  grandfather; Stroke in her mother.  Medical History: Past Medical History:  Diagnosis Date   ADD (attention deficit disorder)    Arthritis    Asthma    Blood transfusion without reported diagnosis    Depression    Diverticulosis    GERD (gastroesophageal reflux disease)    H/O: GI bleed    erosive gastritis (NSAID) 2007 in [place]   Hypertension    Hypothyroidism    Insomnia    Iron deficiency anemia    Osteopenia    Personal history of colonic adenoma 07/06/2013   Pneumonia    Trigeminal neuralgia     hx of    Surgical History: Past Surgical History:  Procedure Laterality Date   ABDOMINAL HYSTERECTOMY  age 61   BILATERAL SALPINGOOPHORECTOMY     COLONOSCOPY     HIATAL HERNIA REPAIR N/A 08/30/2013   Procedure: LAPAROSCOPIC REPAIR OF HIATAL HERNIA;  Surgeon: Azucena Bollard, MD;  Location: [facility];  Service: General;  Laterality: N/A;  With MESH   knee menscectomy Right 07/2009   TOTAL KNEE ARTHROPLASTY Left 11/25/2019   Procedure: TOTAL KNEE ARTHROPLASTY;  Surgeon: Marlena Sima, MD;  Location: [facility];  Service: Orthopedics;  Laterality: Left;   UPPER GASTROINTESTINAL ENDOSCOPY     VAGINAL HYSTERECTOMY       Medications:   Current Facility-Administered Medications:    cefTRIAXone (ROCEPHIN) 2 g in sodium chloride 0.9 % 100 mL IVPB, 2 g, Intravenous, Q24H, Katsaros, Lindsey R, RPH, Stopped at 02/27/24 2146   Chlorhexidine Gluconate Cloth 2 % PADS 6 each, 6 each, Topical, Daily, Quillian Brunt, MD, 6 each at 02/27/24 2035   insulin aspart (novoLOG) injection 0-6 Units, 0-6 Units, Subcutaneous, Q4H, Babcock, Peter E, NP   lactated ringers infusion, , Intravenous, Continuous, Harris, Whitney D, NP, Last Rate: 250 mL/hr at 02/28/24 0700, Infusion Verify at 02/28/24 0700   mupirocin ointment (BACTROBAN) 2 % 1 Application, 1 Application, Nasal, BID, Quillian Brunt, MD, 1 Application at 02/27/24 2117   naloxone HCl (NARCAN) 4 mg in dextrose 5 % 250 mL infusion, 0.5 mg/hr,  Intravenous, Continuous, Harris, Whitney D, NP, Last Rate: 31.3 mL/hr at 02/28/24 0700, 0.5 mg/hr at 02/28/24 0700   norepinephrine (LEVOPHED) 4mg  in (0.016 mg/mL) premix infusion, 0-10 mcg/min, Intravenous, Continuous, Babcock, Peter E, NP   Oral care mouth rinse, 15 mL, Mouth Rinse, PRN, Quillian Brunt, MD   pantoprazole (PROTONIX) injection 40 mg, 40 mg, Intravenous, Q12H, Babcock, Peter E, NP, 40 mg at 02/27/24 2117   phenol (CHLORASEPTIC) mouth spray  1 spray, 1 spray, Mouth/Throat, PRN, Deneise Finlay D, NP  Allergies: Allergies  Allergen Reactions   Wellbutrin [Bupropion] Anaphylaxis, Swelling and Rash   Ace Inhibitors Cough   Norvasc [Amlodipine] Other (See Comments)    Malaise     Marliyah Reid, NP

## 2024-02-29 ENCOUNTER — Inpatient Hospital Stay (HOSPITAL_COMMUNITY)

## 2024-02-29 DIAGNOSIS — F411 Generalized anxiety disorder: Secondary | ICD-10-CM | POA: Diagnosis not present

## 2024-02-29 DIAGNOSIS — G9340 Encephalopathy, unspecified: Secondary | ICD-10-CM | POA: Diagnosis not present

## 2024-02-29 DIAGNOSIS — F322 Major depressive disorder, single episode, severe without psychotic features: Secondary | ICD-10-CM | POA: Diagnosis not present

## 2024-02-29 LAB — CBC
HCT: 30.5 % — ABNORMAL LOW (ref 36.0–46.0)
Hemoglobin: 10.3 g/dL — ABNORMAL LOW (ref 12.0–15.0)
MCH: 29.5 pg (ref 26.0–34.0)
MCHC: 33.8 g/dL (ref 30.0–36.0)
MCV: 87.4 fL (ref 80.0–100.0)
Platelets: 174 10*3/uL (ref 150–400)
RBC: 3.49 MIL/uL — ABNORMAL LOW (ref 3.87–5.11)
RDW: 13.4 % (ref 11.5–15.5)
WBC: 7.1 10*3/uL (ref 4.0–10.5)
nRBC: 0 % (ref 0.0–0.2)

## 2024-02-29 LAB — BASIC METABOLIC PANEL WITH GFR
Anion gap: 9 (ref 5–15)
BUN: 10 mg/dL (ref 8–23)
CO2: 25 mmol/L (ref 22–32)
Calcium: 8.7 mg/dL — ABNORMAL LOW (ref 8.9–10.3)
Chloride: 107 mmol/L (ref 98–111)
Creatinine, Ser: 0.77 mg/dL (ref 0.44–1.00)
GFR, Estimated: >60 mL/min (ref >60)
Glucose, Bld: 93 mg/dL (ref 70–99)
Potassium: 3.7 mmol/L (ref 3.5–5.1)
Sodium: 141 mmol/L (ref 135–145)

## 2024-02-29 LAB — GLUCOSE, CAPILLARY
Glucose-Capillary: 105 mg/dL — ABNORMAL HIGH (ref 70–99)
Glucose-Capillary: 90 mg/dL (ref 70–99)
Glucose-Capillary: 117 mg/dL — ABNORMAL HIGH (ref 70–99)

## 2024-02-29 LAB — CK TOTAL AND CKMB (NOT AT ARMC)
CK, MB: 43.1 ng/mL — ABNORMAL HIGH (ref 0.5–5.0)
Total CK: 10422 U/L — ABNORMAL HIGH (ref 38–234)

## 2024-02-29 LAB — MAGNESIUM: Magnesium: 2.1 mg/dL (ref 1.7–2.4)

## 2024-02-29 MED ORDER — MELATONIN 3 MG PO TABS
3.0000 mg | ORAL_TABLET | Freq: Once | ORAL | Status: AC
  Administered 2024-02-29: 3 mg via ORAL
  Filled 2024-02-29: qty 1

## 2024-02-29 MED ORDER — POTASSIUM CHLORIDE CRYS ER 20 MEQ PO TBCR
40.0000 meq | EXTENDED_RELEASE_TABLET | Freq: Once | ORAL | Status: AC
  Administered 2024-02-29: 40 meq via ORAL
  Filled 2024-02-29: qty 2

## 2024-02-29 NOTE — Plan of Care (Signed)
  Problem: Education: Goal: Ability to describe self-care measures that may prevent or decrease complications (Diabetes Survival Skills Education) will improve Outcome: Progressing Goal: Individualized Educational Video(s) Outcome: Progressing   Problem: Coping: Goal: Ability to adjust to condition or change in health will improve Outcome: Progressing   Problem: Fluid Volume: Goal: Ability to maintain a balanced intake and output will improve Outcome: Progressing   Problem: Health Behavior/Discharge Planning: Goal: Ability to identify and utilize available resources and services will improve Outcome: Progressing Goal: Ability to manage health-related needs will improve Outcome: Progressing   Problem: Metabolic: Goal: Ability to maintain appropriate glucose levels will improve Outcome: Progressing   Problem: Nutritional: Goal: Maintenance of adequate nutrition will improve Outcome: Progressing Goal: Progress toward achieving an optimal weight will improve Outcome: Progressing   Problem: Tissue Perfusion: Goal: Adequacy of tissue perfusion will improve Outcome: Progressing   Problem: Skin Integrity: Goal: Risk for impaired skin integrity will decrease Outcome: Progressing   Problem: Education: Goal: Knowledge of General Education information will improve Description: Including pain rating scale, medication(s)/side effects and non-pharmacologic comfort measures Outcome: Progressing   Problem: Clinical Measurements: Goal: Ability to maintain clinical measurements within normal limits will improve Outcome: Progressing Goal: Will remain free from infection Outcome: Progressing Goal: Diagnostic test results will improve Outcome: Progressing Goal: Respiratory complications will improve Outcome: Progressing Goal: Cardiovascular complication will be avoided Outcome: Progressing   Problem: Activity: Goal: Risk for activity intolerance will decrease Outcome: Progressing

## 2024-02-29 NOTE — Progress Notes (Signed)
 [facility] ADULT ICU REPLACEMENT PROTOCOL   The patient does apply for the [facility] Adult ICU Electrolyte Replacment Protocol based on the criteria listed below:   1.Exclusion criteria: TCTS, ECMO, Dialysis, and Myasthenia Gravis patients 2. Is GFR >/= 30 ml/min? Yes.    Patient's GFR today is >60 3. Is SCr </= 2? Yes.   Patient's SCr is 0.77 mg/dL 4. Did SCr increase >/= 0.5 in 24 hours? No. 5.Pt's weight >40kg  Yes.   6. Abnormal electrolyte(s): K+ 3.7  7. Electrolytes replaced per protocol 8.  Call MD STAT for K+ </= 2.5, Phos </= 1, or Mag </= 1 Physician:  Jonnie Nettles 02/29/2024 3:31 AM

## 2024-02-29 NOTE — Consult Note (Signed)
 [facility] Psychiatric Consult Initial  Patient Name: .PADEN KURAS  MRN: [id number]  DOB: Oct 03, 1941  Consult Order details:  Orders (From admission, onward)     Start     Ordered   02/27/24 1439  IP CONSULT TO PSYCHIATRY       Comments: Urgent consult please call Deneise Finlay [phone] or secure chat for details  Ordering Provider: Enrico Hartshorn, NP  Provider:  (Not yet assigned)  Question Answer Comment  Location [facility]   Reason for Consult? Suicide attempt      02/27/24 1445             Mode of Visit: In person    Psychiatry Consult Evaluation  Service Date: February 29, 2024 LOS:  LOS: 3 days  Chief Complaint "I overdosed"  Primary Psychiatric Diagnoses  Major Depression, severe without psychosis 2.  GAD  Assessment  WILHEMENIA CAMBA is a 83 y.o. female admitted: Medicallyfor 02/26/2024 12:35 PM for suicide attempt. She carries the psychiatric diagnoses of Depression and Anxiety and has a past medical history of HTN, IDA, hx GIB, CKD IIIB with recent diarrhea for 3 days who was found down on wellness check. EMS gave Narcan. In the ED code stroke called but work-up neg for stroke. Spot EEG neg. Required levophed for hypotension and given 3L IVF. PCCM consulted for admission.   Her current presentation of an ongoing depressive symptoms with feelings of hopelesssnes and helplessness that eventually led to an intentional overdose in a suicide attempt is consistent with Major Depressive Disorder. Patient has significant history of depression and anxiety and is currently on Cymbalta. She is currently not being followed by a psychiatrist. Her symptoms have been associated with significant distress, decrease in physical activity possibly due to aging process vs multiple health issues (medical and personal care) , impaired social functioning, and financial difficulty.   02/28/2024 On interview, patient presents alert and oriented x 4. Conversation was  largely logical and goal directed. Her stressors appears to be long term issue. She remains passively suicidal and currently without plan or intent, however her worsening depression and feelings of hopelessness are concerning and placed her at a chronically elevated risk of suicide which may be mitigated by inpatient psychiatric treatment at this time.  She has limited protective factors and may likely make another suicide attempt.  She is not amenable to treatment at this time, and may be discharged home to her daughter with safety plan. We will reassess her for safety once medically cleared.   02/29/2024 Patient seen laying in bed this morning on my approach accompanied by sitter at bedside. She reports that she is currently in the hospital because she made a bad mistake. She admits to taking an overdose of her medication with the intent to take her life. She reports feeling hopelessness and despair regarding her life. She was unable to report any specific stressor that pushed her to this attempt. She does not think  that she needs to be in the hospital and she would like to return home as soon as possible. She reports that she lives nearby her daughter and that will keep her safe.  Diagnoses:  Active Hospital problems: Principal Problem:   Acute encephalopathy    Plan   ## Psychiatric Medication Recommendations:  -- We recommend inpatient psychiatric hospitalization when medically cleared. Patient is under voluntary admission status at this time; please IVC if attempts to leave hospital.  ## Medical Decision Making Capacity: Not  specifically addressed in this encounter  ## Further Work-up:  -- most recent EKG on 4/13 had QtC of 521 -- Pertinent labwork reviewed earlier this admission includes: 4/13- CK- 19,343, creatinine- elevated  LFT,    ## Disposition:-- We recommend inpatient psychiatric hospitalization when medically cleared. Patient is under voluntary admission status at this time;  please IVC if attempts to leave hospital.  ## Behavioral / Environmental: -Delirium Precautions: Delirium Interventions for Nursing and Staff: - RN to open blinds every AM. - To Bedside: Glasses, hearing aide, and pt's own shoes. Make available to patients. when possible and encourage use. - Encourage po fluids when appropriate, keep fluids within reach. - OOB to chair with meals. - Passive ROM exercises to all extremities with AM & PM care. - RN to assess orientation to person, time and place QAM and PRN. - Recommend extended visitation hours with familiar family/friends as feasible. - Staff to minimize disturbances at night. Turn off television when pt asleep or when not in use.    ## Safety and Observation Level:  - Based on my clinical evaluation, I estimate the patient to be at high risk of self harm in the current setting. - At this time, we recommend  1:1 Observation. This decision is based on my review of the chart including patient's history and current presentation, interview of the patient, mental status examination, and consideration of suicide risk including evaluating suicidal ideation, plan, intent, suicidal or self-harm behaviors, risk factors, and protective factors. This judgment is based on our ability to directly address suicide risk, implement suicide prevention strategies, and develop a safety plan while the patient is in the clinical setting. Please contact our team if there is a concern that risk level has changed.  CSSR Risk Category:C-SSRS RISK CATEGORY: High Risk  Suicide Risk Assessment: Patient has following modifiable risk factors for suicide: active suicidal ideation, under treated depression , and social isolation, which we are addressing by continuing observatin. Patient has following non-modifiable or demographic risk factors for suicide: separation or divorce and history of suicide attempt Patient has the following protective factors against suicide: Supportive  family  Thank you for this consult request. Recommendations have been communicated to the primary team.  We will follow up at this time.   Roseline Conine, DO       History of Present Illness  Relevant Aspects of [facility] Course: CLISTA RAINFORD is a 83 y.o. female admitted: Medicallyfor 02/26/2024 12:35 PM for suicide attempt. She carries the psychiatric diagnoses of Depression and Anxiety and has a past medical history of HTN, IDA, hx GIB, CKD IIIB with recent diarrhea for 3 days who was found down on wellness check. EMS gave Narcan. In the ED code stroke called but work-up neg for stroke. Spot EEG neg. Required levophed for hypotension and given 3L IVF. PCCM consulted for admission.  Admitted on 02/26/2024 for medical overdose in a suicide attempt.    Patient Report:  Patient found in bed with sitter and daughter in the room. Patient was interviewed in the absence of her daughter who wanted to give her privacy. Patient describes her presenting problem as " I overdosed." Patient states that she took multiple unknown medication, including an old prescription of oxycodone. She was unable to quantify. She felt that she was "ready to go, I'm 82 and tired." Notes that she has been having suicidal thought for many months but unable to states what precipitated thjis particular event. Did not remember speaking to her  brother the night before or writing a suicide letter. This is her second suicide attempt. The first one was "many years ago" when she was with her husband. She describes the marriage as "toxic" patient also took medication overdose but did not seek help. Currently describes her mood as "hopeless and totally helpless." She feels disappointed that this suicide attempt was unsuccessful and would like. Currently denies wanting to attempt suicide again because she no longer has medication and she is afraid of being unsuccessful again.   Patient sates that she lives alone without support from  anyone and has no social avenue except going to the grocery store for shopping. She feels overwhelmed with day to activities as she is often physically exhausted. Patent aslo mentions that she is experinecing financial difficulties. She gets about 1400 a month, house rent his 900 leaving her with little to cover her utility bills and grocery. She has been divorced for about 15 years and has two adult daughters.   Psych ROS:  Depression: Hopeless and "totally helpless," difficulty maintaining sleep Anxiety:  Feelings of restlessness, constant worry about caring for self Mania (lifetime and current): Denies mood liability Psychosis: (lifetime and current): Denies hallucinations in all modalities  Collateral information:  Contacted Daughter was present for this collateral, she reported that's she was out of town when when her uncle notified her that he was unable to reach her mother. Lauren stated that her uncle had spoken with mom the night before an she was supposed to call him the next morning. They decided to have a wellness check up on her when they did not hear from her. She reported that her mother suffers from depression. Described her family as "dysfunctional" and they have all been in therapy over there years. She has not spoken to her sister in 26 years until yesterday when she had to speak with her about their mother.  Daughter noted that when she returned to the house after mother was broughtt to the hospital, she found a detailed letter which appered to be a suicide note. The letter was addressed to her uncle and content of the letter has a detailed instruction on how to pay her outstanding bills, with account information, cash addressed to the landlord attached to the note. Daughter noted that her mother has a DNR and she wanted her mother's wishes to be carried out.   ROS   Psychiatric and Social History  Psychiatric History:  Information collected from patient, chart review, and  daughter  Prev Dx/Sx: Depression and Anxiety Current Psych Provider: None Home Meds (current): Cymbalta, Xanax, Adderrall Previous Med Trials: unknown Therapy: Over the years, but not currently  Prior Psych Hospitalization: Patient denies  Prior Self Harm: Hx of medication overdose "many years ago" Prior Violence: denies  Family Psych History: depression with unknown detalis Family Hx suicide: denies  Social History:  Developmental Hx: WNL Educational Hx: COLLEGE Occupational Hx: Retired Nutritional therapist Hx: none Living Situation: Lives alone Spiritual Hx: Believe in God Access to weapons/lethal means: denies owning or having access to a gun.   Substance History Alcohol: vodka, once a week or less  Type of alcohol -vodka  Last Drink -about a week ago Number of drinks per day- n/a History of alcohol withdrawal seizures - denies History of DT's - denies Tobacco: denies Illicit drugs: denies Prescription drug abuse: denies Rehab hx: Denies  Exam Findings  Physical Exam:  Vital Signs:  Temp:  [97.8 F (36.6 C)-98.2 F (36.8 C)] 98 F (  36.7 C) (04/14 0712) Pulse Rate:  [83-99] 84 (04/14 0800) Resp:  [18-28] 21 (04/14 0800) BP: (117-154)/(47-140) 154/140 (04/14 0800) SpO2:  [89 %-99 %] 91 % (04/14 0800) Weight:  [74.8 kg] 74.8 kg (04/14 0209) Blood pressure (!) 154/140, pulse 84, temperature 98 F (36.7 C), temperature source Oral, resp. rate (!) 21, height 5\' 3"  (1.6 m), weight 74.8 kg, SpO2 91%. Body mass index is 29.21 kg/m.  Physical Exam  Mental Status Exam: General Appearance: dressed in hospital gown , appears stated age  Orientation:  Full (Time, Place, and Person)  Memory:  Immediate;   Fair  Concentration:  Concentration: Fair  Recall:  Fair  Attention  Fair  Eye Contact:  Good  Speech:  Clear and Coherent  Language:  Good  Volume:  Normal  Mood: 'hopeless"  Affect:  Congruent and Depressed  Thought Process:  Goal Directed and Linear  Thought  Content:  Logical  Suicidal Thoughts:   passive "disappointed to be alive"  Homicidal Thoughts:  No  Judgement:  Poor  Insight:  Present  Psychomotor Activity:  Decreased  Akathisia:  No  Fund of Knowledge:  Fair      Assets:  Community education officer  Cognition:  WNL  ADL's:  Intact  AIMS (if indicated):        Other History   These have been pulled in through the EMR, reviewed, and updated if appropriate.  Family History:  The patient's family history includes Cancer in her sister; Heart attack (age of onset: 59) in her father; Stomach cancer in her maternal grandfather; Stroke in her mother.  Medical History: Past Medical History:  Diagnosis Date   ADD (attention deficit disorder)    Arthritis    Asthma    Blood transfusion without reported diagnosis    Depression    Diverticulosis    GERD (gastroesophageal reflux disease)    H/O: GI bleed    erosive gastritis (NSAID) 2007 in [place]   Hypertension    Hypothyroidism    Insomnia    Iron deficiency anemia    Osteopenia    Personal history of colonic adenoma 07/06/2013   Pneumonia    Trigeminal neuralgia     hx of    Surgical History: Past Surgical History:  Procedure Laterality Date   ABDOMINAL HYSTERECTOMY  age 76   BILATERAL SALPINGOOPHORECTOMY     COLONOSCOPY     HIATAL HERNIA REPAIR N/A 08/30/2013   Procedure: LAPAROSCOPIC REPAIR OF HIATAL HERNIA;  Surgeon: Valarie Merino, MD;  Location: [facility];  Service: General;  Laterality: N/A;  With MESH   knee menscectomy Right 07/2009   TOTAL KNEE ARTHROPLASTY Left 11/25/2019   Procedure: TOTAL KNEE ARTHROPLASTY;  Surgeon: Frederico Hamman, MD;  Location: [facility];  Service: Orthopedics;  Laterality: Left;   UPPER GASTROINTESTINAL ENDOSCOPY     VAGINAL HYSTERECTOMY       Medications:   Current Facility-Administered Medications:    acetaminophen (TYLENOL) tablet 650 mg, 650 mg, Oral, Q6H PRN, Harris, Whitney D, NP, 650 mg at 02/29/24 0132    cefTRIAXone (ROCEPHIN) 2 g in sodium chloride 0.9 % 100 mL IVPB, 2 g, Intravenous, Q24H, Harris, Whitney D, NP, Stopped at 02/28/24 2137   Chlorhexidine Gluconate Cloth 2 % PADS 6 each, 6 each, Topical, Daily, Luciano Cutter, MD, 6 each at 02/29/24 0806   enoxaparin (LOVENOX) injection 40 mg, 40 mg, Subcutaneous, Q24H, Harris, Whitney D, NP, 40 mg at 02/28/24 1525   lactated ringers infusion, ,  Intravenous, Continuous, Pham, Minh Q, RPH-CPP, Last Rate: 250 mL/hr at 02/29/24 0905, New Bag at 02/29/24 0905   levothyroxine (SYNTHROID) tablet 88 mcg, 88 mcg, Oral, Q0600, Deneise Finlay D, NP, 88 mcg at 02/29/24 0503   mupirocin ointment (BACTROBAN) 2 % 1 Application, 1 Application, Nasal, BID, Quillian Brunt, MD, 1 Application at 02/29/24 7829   Oral care mouth rinse, 15 mL, Mouth Rinse, PRN, Quillian Brunt, MD   pantoprazole (PROTONIX) EC tablet 40 mg, 40 mg, Oral, BID, Harris, Whitney D, NP, 40 mg at 02/29/24 0805   phenol (CHLORASEPTIC) mouth spray 1 spray, 1 spray, Mouth/Throat, PRN, Deneise Finlay D, NP, 1 spray at 02/29/24 0305  Allergies: Allergies  Allergen Reactions   Wellbutrin [Bupropion] Anaphylaxis, Swelling and Rash   Ace Inhibitors Cough   Norvasc [Amlodipine] Other (See Comments)    Malaise     Roseline Conine, DO

## 2024-02-29 NOTE — Evaluation (Signed)
 Occupational Therapy Evaluation Patient Details Name: Valerie Davis MRN: [id number] DOB: November 10, 1941 Today's Date: 02/29/2024   History of Present Illness   The pt is an 83 yo female presenting 4/11 after being found down at home. Pt obtunded with AMS, improved with Flumanzenil. Pt admitted for management of acute encephalopathy due to overdose. PMH includes: arthritis, ADD, HTN, GIB, CKD III, and depression.     Clinical Impressions PTA, pt lives alone and typically completely independent in all ADLs, IADLs and mobility without AD. Pt presents now with deficits in cognition, standing balance, strength, endurance and L shoulder ROM/pain. Pt requiring Min A for mobility without AD; deferred RW due to L shoulder pain/pending imaging. Pt requires Min A for UB ADL and up to Mod A for LB ADLs. Pt with word finding difficulties during session. Anticipate with continued OOB activity that pt will improve acutely to not require a postacute rehab stay but would recommend consistent support/assist at DC initially.     If plan is discharge home, recommend the following:   A little help with walking and/or transfers;A little help with bathing/dressing/bathroom;Assistance with cooking/housework;Direct supervision/assist for medications management;Direct supervision/assist for financial management;Assist for transportation     Functional Status Assessment   Patient has had a recent decline in their functional status and demonstrates the ability to make significant improvements in function in a reasonable and predictable amount of time.     Equipment Recommendations   Other (comment) (RW; TBD)     Recommendations for Other Services         Precautions/Restrictions   Precautions Precautions: Fall Restrictions Weight Bearing Restrictions Per Provider Order: No     Mobility Bed Mobility Overal bed mobility: Needs Assistance Bed Mobility: Supine to Sit, Sit to Supine     Supine to  sit: Contact guard, HOB elevated, Used rails Sit to supine: Mod assist   General bed mobility comments: due to bed height (too high for pt despite bed lowered all the way), pt required Mod A to lift BLE back to bed and guide trunk to bed    Transfers Overall transfer level: Needs assistance Equipment used: None Transfers: Sit to/from Stand Sit to Stand: Min assist           General transfer comment: pt opting to try standing without AD though Min A needed for stability. Pt using RUE on IV pole due to LUE pain so RW deferred. Min A for mobility without AD in room      Balance Overall balance assessment: Needs assistance Sitting-balance support: No upper extremity supported Sitting balance-Leahy Scale: Good     Standing balance support: No upper extremity supported, During functional activity Standing balance-Leahy Scale: Fair                             ADL either performed or assessed with clinical judgement   ADL Overall ADL's : Needs assistance/impaired Eating/Feeding: Set up   Grooming: Minimal assistance;Standing;Oral care Grooming Details (indicate cue type and reason): cues and assist to place toothpaste on toothbrush. CGA for safety/balance standing at sink Upper Body Bathing: Minimal assistance;Sitting   Lower Body Bathing: Minimal assistance;Sit to/from stand;Sitting/lateral leans   Upper Body Dressing : Minimal assistance;Sitting   Lower Body Dressing: Moderate assistance;Sitting/lateral leans;Sit to/from stand   Toilet Transfer: Minimal assistance;Ambulation   Toileting- Clothing Manipulation and Hygiene: Minimal assistance;Sit to/from stand;Sitting/lateral lean       Functional mobility during ADLs: Minimal assistance  Vision Baseline Vision/History: 1 Wears glasses Ability to See in Adequate Light: 0 Adequate Patient Visual Report: No change from baseline Vision Assessment?: No apparent visual deficits     Perception          Praxis         Pertinent Vitals/Pain Pain Assessment Pain Assessment: Faces Faces Pain Scale: Hurts even more Pain Location: L shoulder Pain Descriptors / Indicators: Aching, Discomfort Pain Intervention(s): Monitored during session, Limited activity within patient's tolerance     Extremity/Trunk Assessment Upper Extremity Assessment Upper Extremity Assessment: Generalized weakness;Right hand dominant;LUE deficits/detail LUE Deficits / Details: reporting new pain in L shoulder. able to flex to 75* without pain, elbow [facility]   Lower Extremity Assessment Lower Extremity Assessment: Defer to PT evaluation   Cervical / Trunk Assessment Cervical / Trunk Assessment: Kyphotic   Communication Communication Communication: Impaired Factors Affecting Communication: Difficulty expressing self   Cognition Arousal: Alert Behavior During Therapy: Impulsive, Restless Cognition: Cognition impaired     Awareness: Intellectual awareness intact, Online awareness impaired Memory impairment (select all impairments): Declarative long-term memory, Short-term memory Attention impairment (select first level of impairment): Selective attention, Sustained attention Executive functioning impairment (select all impairments): Sequencing, Reasoning, Problem solving OT - Cognition Comments: pleasant, some impulsivity with movements and decreased awareness of plan, safety and decresed attention to tasks. pt with word finding difficulties, often saying the wrong words                 Following commands: Impaired Following commands impaired: Follows one step commands with increased time, Only follows one step commands consistently, Follows multi-step commands inconsistently     Cueing  General Comments   Cueing Techniques: Verbal cues;Gestural cues  Recruitment consultant at bedside, SpoO2 WFL on RA- reapplied 2 L O2 at end of session   Exercises     Shoulder Instructions      Home Living  Family/patient expects to be discharged to:: Private residence Living Arrangements: Alone Available Help at Discharge: Family;Available PRN/intermittently Type of Home: House Home Access: Stairs to enter [employer] of Steps: 3 Entrance Stairs-Rails: None Home Layout: One level     Bathroom Shower/Tub: Chief Strategy Officer: Standard     Home Equipment: Cane - single point;Grab bars - tub/shower          Prior Functioning/Environment Prior Level of Function : Independent/Modified Independent;Driving             Mobility Comments: pt reports independence without use of DME ADLs Comments: indep with ADLs, IADLs, driving    OT Problem List: Decreased strength;Decreased activity tolerance;Impaired balance (sitting and/or standing);Decreased cognition;Decreased safety awareness;Decreased knowledge of use of DME or AE;Impaired UE functional use;Pain   OT Treatment/Interventions: Self-care/ADL training;Therapeutic exercise;Energy conservation;DME and/or AE instruction;Therapeutic activities;Patient/family education;Balance training      OT Goals(Current goals can be found in the care plan section)   Acute Rehab OT Goals Patient Stated Goal: for L shoulder to feel better OT Goal Formulation: With patient Time For Goal Achievement: 03/14/24 Potential to Achieve Goals: Good   OT Frequency:  Min 2X/week    Co-evaluation              AM-PAC OT "6 Clicks" Daily Activity     Outcome Measure Help from another person eating meals?: None Help from another person taking care of personal grooming?: A Little Help from another person toileting, which includes using toliet, bedpan, or urinal?: A Little Help from another person bathing (including washing, rinsing,  drying)?: A Little Help from another person to put on and taking off regular upper body clothing?: A Little Help from another person to put on and taking off regular lower body clothing?: A  Lot 6 Click Score: 18   End of Session Equipment Utilized During Treatment: Gait belt  Activity Tolerance: Patient tolerated treatment well Patient left: in bed;with call bell/phone within reach;with nursing/sitter in room  OT Visit Diagnosis: Other abnormalities of gait and mobility (R26.89);Unsteadiness on feet (R26.81);Muscle weakness (generalized) (M62.81);Other symptoms and signs involving cognitive function                Time: 1035-1059 OT Time Calculation (min): 24 min Charges:  OT Evaluation $OT Eval Moderate Complexity: 1 Mod  Lawrence Pretty, OTR/L Acute Rehab Services Office: [phone]   Annabella Barr 02/29/2024, 12:02 PM

## 2024-02-29 NOTE — TOC CM/SW Note (Addendum)
 Transition of Care [facility]) - Inpatient Brief Assessment   Patient Details  Name: Valerie Davis MRN: [id number] Date of Birth: 28-Nov-1940  [facility] of [facility] CM/SW Contact:    Tom-Johnson, Angelique Ken, RN Phone Number: 02/29/2024, 1:37 PM   Clinical Narrative:  Patient presented to the ED after an intentional Overdose in a Suicide attempt.  Home health recommended, Psychiatry following, recommends inpatient Psychiatric Hospitalization when Medically cleared.  Patient is currently under Voluntary Admission Status, will be IVC'd if attempts to leave Hospital.   [facility] will continue to follow.    Transition of Care Asessment:

## 2024-02-29 NOTE — Progress Notes (Signed)
 PROGRESS NOTE  Valerie Davis  UEA:[id number] DOB: [birth year] DOA: 02/26/2024 PCP: [name]., MD  Consultants  Brief Narrative: [phone] with HTN, IDA, hx GIB, CKD IIIB with recent diarrhea for 3 days who was found down on wellness check. EMS gave Narcan. In the ED code stroke called but work-up neg for stroke. Spot EEG neg. Required levophed for hypotension and given 3L IVF. PCCM consulted for admission.    Assessment & Plan: Acute metabolic encephalopathy in the setting of intentional drug overdose with suicide attempt - Patient remains with passive death wish. - Has been seen by psychiatry.  Awaiting medical clearance and then will likely transfer to inpatient psychiatry. - She is more open today about her suicide attempt. Seizure precautions Aspiration precautions One-to-one sitter   Rhabdomyolysis - Continue with hydration.  CK is downtrending but still 10,000.  She is continue with IV hydration. -Renal function has remained normal which is good.  Will continue to monitor   Acute hypoxic respiratory failure secondary to likely aspiration pneumonia versus CAP Continue empiric ceftriaxone x 5 days Aspiration precautions Encourage adequate pulmonary hygiene Mobilize   History of hypothyroidism P: Continue Synthroid   Hematemesis -Patient was found on the floor, unsure observed blood on face on EMS arrival was secondary to trauma or hematic emesis Continue home PPI         DVT prophylaxis:  enoxaparin (LOVENOX) injection 40 mg Start: 02/28/24 1215 Place and maintain sequential compression device Start: 02/27/24 1122 SCDs Start: 02/26/24 1640  Code Status:   Code Status: Limited: Do not attempt resuscitation (DNR) -DNR-LIMITED -Do Not Intubate/DNI  Family Communication: Daughter per phone Level of care: Progressive Status is: Inpatient  Consults called: Psychiatry  Subjective: Patient awake and alert without complaints this morning.  She would talk with me about  reason she was in the hospital and reports that "it was stupid."  Eating and drinking well.  Objective: Vitals:   02/29/24 1100 02/29/24 1200 02/29/24 1300 02/29/24 1400  BP: 114/88   131/84  Pulse: 84 87 84 85  Resp: (!) 22 (!) 23 (!) 25 14  Temp:      TempSrc:      SpO2: 93% 95% 95% 95%  Weight:      Height:        Intake/Output Summary (Last 24 hours) at 02/29/2024 1730 Last data filed at 02/29/2024 1440 Gross per 24 hour  Intake 5110.8 ml  Output 2500 ml  Net 2610.8 ml   Filed Weights   02/27/24 0500 02/28/24 0303 02/29/24 0209  Weight: 74.8 kg 74.8 kg 74.8 kg   Body mass index is 29.21 kg/m.  Gen: 83 y.o. female in no apparent distress.  Nontoxic Pulm: Non-labored breathing.  Clear to auscultation bilaterally.  CV: Regular rate and rhythm. No murmur, rub, or gallop. No JVD GI: Abdomen soft, non-tender, non-distended, with normoactive bowel sounds. No organomegaly or masses felt. Ext: Warm, no deformities, trace pedal edema Skin: No rashes, lesions  Neuro: Alert and oriented. No focal neurological deficits. Psych: Calm  Judgement and insight appear normal. Mood & affect appropriate, though somewhat flat   I have personally reviewed the following labs and images: CBC: Recent Labs  Lab 02/26/24 1345 02/26/24 1350 02/26/24 1535 02/26/24 1732 02/27/24 0334 02/29/24 0022  WBC 3.7*  --   --  5.6 9.9 7.1  NEUTROABS 2.9  --   --   --   --   --   HGB 10.1* 9.5* 10.6* 11.5* 10.8*  10.3*  HCT 31.2* 28.0* 33.2* 35.7* 32.1* 30.5*  MCV 90.4  --   --  89.5 85.1 87.4  PLT 172  --   --  203 208 174   BMP &GFR Recent Labs  Lab 02/26/24 1732 02/27/24 0334 02/27/24 1645 02/28/24 0016 02/28/24 1126 02/29/24 0022  NA 139 139 137 140  --  141  K 3.1* 3.2* 3.6 3.8  --  3.7  CL 105 106 103 106  --  107  CO2 22 22 22 23   --  25  GLUCOSE 144* 110* 116* 107*  --  93  BUN 22 20 13 10   --  10  CREATININE 1.36* 1.11* 0.89 0.87  --  0.77  CALCIUM 7.9* 8.2* 8.6* 8.6*  --  8.7*   MG  --  1.6*  --  2.0 1.6* 2.1  PHOS  --  4.0  --   --   --   --    Estimated Creatinine Clearance: 54.4 mL/min (by C-G formula based on SCr of 0.77 mg/dL). Liver & Pancreas: Recent Labs  Lab 02/26/24 1345 02/27/24 1304  AST 21 206*  ALT 13 67*  ALKPHOS 54 58  BILITOT 0.6 1.0  PROT 5.3* 5.5*  ALBUMIN 3.0* 2.8*   No results for input(s): "LIPASE", "AMYLASE" in the last 168 hours. Recent Labs  Lab 02/26/24 1732  AMMONIA 36*   Diabetic: Recent Labs    02/26/24 1732  HGBA1C 5.5   Recent Labs  Lab 02/28/24 1932 02/28/24 2336 02/29/24 0258 02/29/24 0711 02/29/24 1056  GLUCAP 81 87 105* 90 117*   Cardiac Enzymes: Recent Labs  Lab 02/27/24 1645 02/28/24 0016 02/28/24 1126 02/28/24 1750 02/29/24 0022  CKTOTAL 17,747*  17,747* 19,343* 12,462* 11,497* 10,422*  CKMB 249.2* 168.5* 83.5* 57.7* 43.1*   No results for input(s): "PROBNP" in the last 8760 hours. Coagulation Profile: Recent Labs  Lab 02/26/24 1345  INR 1.2   Thyroid Function Tests: Recent Labs    02/26/24 1948  TSH 0.705   Lipid Profile: No results for input(s): "CHOL", "HDL", "LDLCALC", "TRIG", "CHOLHDL", "LDLDIRECT" in the last 72 hours. Anemia Panel: No results for input(s): "VITAMINB12", "FOLATE", "FERRITIN", "TIBC", "IRON", "RETICCTPCT" in the last 72 hours. Urine analysis:    Component Value Date/Time   COLORURINE YELLOW 02/26/2024 2213   APPEARANCEUR CLEAR 02/26/2024 2213   LABSPEC >1.046 (H) 02/26/2024 2213   PHURINE 5.0 02/26/2024 2213   GLUCOSEU NEGATIVE 02/26/2024 2213   GLUCOSEU NEGATIVE 09/18/2010 1605   HGBUR LARGE (A) 02/26/2024 2213   BILIRUBINUR NEGATIVE 02/26/2024 2213   BILIRUBINUR small 06/19/2012 1238   KETONESUR NEGATIVE 02/26/2024 2213   PROTEINUR 100 (A) 02/26/2024 2213   UROBILINOGEN 0.2 06/19/2012 1238   UROBILINOGEN 0.2 09/18/2010 1605   NITRITE NEGATIVE 02/26/2024 2213   LEUKOCYTESUR NEGATIVE 02/26/2024 2213   Sepsis Labs: Invalid input(s):  "PROCALCITONIN", "LACTICIDVEN"  Microbiology: Recent Results (from the past 240 hours)  Blood culture (routine x 2)     Status: None (Preliminary result)   Collection Time: 02/26/24  2:00 PM   Specimen: BLOOD  Result Value Ref Range Status   Specimen Description BLOOD RIGHT ANTECUBITAL  Final   Special Requests   Final    BOTTLES DRAWN AEROBIC AND ANAEROBIC Blood Culture results may not be optimal due to an inadequate volume of blood received in culture bottles   Culture   Final    NO GROWTH 3 DAYS Performed at [facility], [address], [place], [place] [address]  Report Status PENDING  Incomplete  Blood culture (routine x 2)     Status: None (Preliminary result)   Collection Time: 02/26/24  3:35 PM   Specimen: BLOOD LEFT HAND  Result Value Ref Range Status   Specimen Description BLOOD LEFT HAND  Final   Special Requests   Final    BOTTLES DRAWN AEROBIC AND ANAEROBIC Blood Culture results may not be optimal due to an inadequate volume of blood received in culture bottles   Culture   Final    NO GROWTH 3 DAYS Performed at [facility], [address] [place], [place] [address]    Report Status PENDING  Incomplete  MRSA Next Gen by PCR, Nasal     Status: Abnormal   Collection Time: 02/26/24  6:04 PM   Specimen: Nasal Mucosa; Nasal Swab  Result Value Ref Range Status   MRSA by PCR Next Gen DETECTED (A) NOT DETECTED Final    Comment: RESULT CALLED TO, READ BACK BY AND VERIFIED WITH: RN ELI GODWIN [id number] AT 2010 BY EC (NOTE) The GeneXpert MRSA Assay (FDA approved for NASAL specimens only), is one component of a comprehensive MRSA colonization surveillance program. It is not intended to diagnose MRSA infection nor to guide or monitor treatment for MRSA infections. Test performance is not FDA approved in patients less than 83 years old. Performed at [facility], [address] [place], [place] [address]     Radiology Studies: DG Shoulder  Left Result Date: 02/29/2024 CLINICAL DATA:  Left shoulder pain.  No known injury. EXAM: LEFT SHOULDER - 2+ VIEW COMPARISON:  None Available. FINDINGS: AP and Y-views are submitted. The mineralization and alignment are normal. There is no evidence of acute fracture or dislocation. The joint spaces appear preserved, and there is no significant narrowing of the subacromial space. IMPRESSION: No evidence of acute fracture or dislocation. No significant arthropathic changes. Electronically Signed   By: Elmon Hagedorn M.D.   On: 02/29/2024 16:45    Scheduled Meds:  Chlorhexidine Gluconate Cloth  6 each Topical Daily   enoxaparin (LOVENOX) injection  40 mg Subcutaneous Q24H   levothyroxine  88 mcg Oral Q0600   mupirocin ointment  1 Application Nasal BID   pantoprazole  40 mg Oral BID   Continuous Infusions:  cefTRIAXone (ROCEPHIN)  IV Stopped (02/28/24 2137)   lactated ringers 250 mL/hr at 02/29/24 1400     LOS: 3 days   35 minutes with more than 50% spent in reviewing records, counseling patient/family and coordinating care.  Trenton Frock, MD [facility] [email or web address] 02/29/2024, 5:30 PM

## 2024-03-01 ENCOUNTER — Inpatient Hospital Stay (HOSPITAL_COMMUNITY)

## 2024-03-01 DIAGNOSIS — G9340 Encephalopathy, unspecified: Secondary | ICD-10-CM | POA: Diagnosis not present

## 2024-03-01 LAB — BASIC METABOLIC PANEL WITH GFR
Anion gap: 11 (ref 5–15)
BUN: 7 mg/dL — ABNORMAL LOW (ref 8–23)
CO2: 23 mmol/L (ref 22–32)
Calcium: 8.5 mg/dL — ABNORMAL LOW (ref 8.9–10.3)
Chloride: 103 mmol/L (ref 98–111)
Creatinine, Ser: 0.84 mg/dL (ref 0.44–1.00)
GFR, Estimated: >60 mL/min (ref >60)
Glucose, Bld: 90 mg/dL (ref 70–99)
Potassium: 3.6 mmol/L (ref 3.5–5.1)
Sodium: 137 mmol/L (ref 135–145)

## 2024-03-01 LAB — CBC
HCT: 28 % — ABNORMAL LOW (ref 36.0–46.0)
Hemoglobin: 9.4 g/dL — ABNORMAL LOW (ref 12.0–15.0)
MCH: 29.2 pg (ref 26.0–34.0)
MCHC: 33.6 g/dL (ref 30.0–36.0)
MCV: 87 fL (ref 80.0–100.0)
Platelets: 184 10*3/uL (ref 150–400)
RBC: 3.22 MIL/uL — ABNORMAL LOW (ref 3.87–5.11)
RDW: 13.2 % (ref 11.5–15.5)
WBC: 5.5 10*3/uL (ref 4.0–10.5)
nRBC: 0 % (ref 0.0–0.2)

## 2024-03-01 LAB — BRAIN NATRIURETIC PEPTIDE: B Natriuretic Peptide: 911.1 pg/mL — ABNORMAL HIGH (ref 0.0–100.0)

## 2024-03-01 LAB — CK: Total CK: 6841 U/L — ABNORMAL HIGH (ref 38–234)

## 2024-03-01 MED ORDER — DOXYCYCLINE HYCLATE 100 MG PO TABS
100.0000 mg | ORAL_TABLET | Freq: Two times a day (BID) | ORAL | Status: AC
  Administered 2024-03-01 – 2024-03-03 (×6): 100 mg via ORAL
  Filled 2024-03-01 (×6): qty 1

## 2024-03-01 MED ORDER — DOXYCYCLINE HYCLATE 100 MG PO TABS: 100.0000 mg | ORAL_TABLET | Freq: Two times a day (BID) | ORAL | Status: DC

## 2024-03-01 MED ORDER — QUETIAPINE FUMARATE 50 MG PO TABS
50.0000 mg | ORAL_TABLET | Freq: Once | ORAL | Status: AC
  Administered 2024-03-01: 50 mg via ORAL
  Filled 2024-03-01: qty 1

## 2024-03-01 MED ORDER — CLONAZEPAM 0.5 MG PO TBDP
0.5000 mg | ORAL_TABLET | Freq: Two times a day (BID) | ORAL | Status: DC | PRN
  Administered 2024-03-01 – 2024-03-03 (×3): 0.5 mg via ORAL
  Filled 2024-03-01 (×4): qty 1

## 2024-03-01 MED ORDER — LORAZEPAM 2 MG/ML IJ SOLN
0.5000 mg | Freq: Once | INTRAMUSCULAR | Status: AC
  Administered 2024-03-01: 0.5 mg via INTRAVENOUS
  Filled 2024-03-01: qty 1

## 2024-03-01 MED ORDER — LACTATED RINGERS IV SOLN: INTRAVENOUS | Status: DC

## 2024-03-01 MED ORDER — CLONAZEPAM 0.5 MG PO TBDP: 0.5000 mg | ORAL_TABLET | Freq: Two times a day (BID) | ORAL | Status: DC | PRN

## 2024-03-01 NOTE — TOC Progression Note (Addendum)
 Transition of Care [facility]) - Progression Note    Patient Details  Name: Valerie Davis MRN: [id number] Date of Birth: 15-Oct-1941  [facility]) CM/SW Contact  Jannice Mends, LCSW Phone Number: 03/01/2024, 4:23 PM  Clinical Narrative:    [facility] does not have beds. CSW sent referral to:  [facility] Pending - No Request [address] [place] [address] [phone] --  [facility] Pending - No Request Sent -- [address]ina Nicks [place] [address] [phone] --  [facility] Pending - No Request Sent -- [address], [place] [address] [phone] --  [facility] Pending - No Request Sent -- [address] [place] [address] [phone] --  [facility] Pending - No Request Sent -- [address] [place], [place] [address] [phone] --  [facility] Pending - No Request Sent -- [address], [place] [address] [phone] --  [facility] Pending - No Request Sent -- [address] [place] [address] [phone] --  [facility] Pending - No Request Sent -- [address] Sharren Decree [place] [address] [phone] --  [facility] Pending - No Request Sent -- [address] Sharren Decree [place] [phone]      4:46 PM-Received call from [facility] requesting to speak with patient's RN. CSW provided phone number.    Expected Discharge Plan and Services         Expected Discharge Date: 03/01/24                                     Social Determinants of Health (SDOH) Interventions SDOH Screenings   Food Insecurity: No Food Insecurity (02/26/2024)  Housing: Low Risk  (02/26/2024)  Transportation Needs: No Transportation Needs  (02/26/2024)  Utilities: Not At Risk (02/26/2024)  Social Connections: Unknown (02/26/2024)  Tobacco Use: Medium Risk (02/26/2024)    Readmission Risk Interventions     No data to display

## 2024-03-01 NOTE — Plan of Care (Signed)

## 2024-03-01 NOTE — TOC Transition Note (Addendum)
 Transition of Care [facility]) - Discharge Note   Patient Details  Name: Valerie Davis MRN: [id number] Date of Birth: 11-Feb-1941  [facility]) CM/SW Contact:  Eusebio High, RN Phone Number: 03/01/2024, 9:29 AM   Clinical Narrative:     Update 10:12 AM RNCM was asked by CSW to order RW . [facility] will NOT provide one  RW ordered from [employer] to be delivered bedside   Patient will DC to [facility] today. Patient will need [facility] to order a RW and BSC at her DC from [facility]. No additional TOC needs at this time            Patient Goals and CMS Choice            Discharge Placement                       Discharge Plan and Services Additional resources added to the After Visit Summary for                                       Social Drivers of Health (SDOH) Interventions SDOH Screenings   Food Insecurity: No Food Insecurity (02/26/2024)  Housing: Low Risk  (02/26/2024)  Transportation Needs: No Transportation Needs (02/26/2024)  Utilities: Not At Risk (02/26/2024)  Social Connections: Unknown (02/26/2024)  Tobacco Use: Medium Risk (02/26/2024)     Readmission Risk Interventions     No data to display

## 2024-03-01 NOTE — Progress Notes (Signed)
 Physical Therapy Treatment Patient Details Name: Valerie Davis MRN: [id number] DOB: 08-Sep-1941 Today's Date: 03/01/2024   History of Present Illness The pt is an 83 yo female presenting 4/11 after being found down at home. Pt obtunded with AMS, improved with Flumanzenil. Pt admitted for management of acute encephalopathy due to overdose. PMH includes: arthritis, ADD, HTN, GIB, CKD III, and depression.    PT Comments  Pt received in supine, pleasantly agreeable to therapy session and with good participation and improved tolerance for transfer, gait and exercise instruction. Pt SpO2 WFL on 3L O2 [place] and able to tolerate increased standing activity this date, including stand hip flexion and heel raises prior to requesting seated break. Pt achieves shorter household distance gait trial with RW support and CGA but needing minA for stability with transfers this date. Pt demonstrates LUE weakness, unclear if this is her baseline, RN notified. Pt continues to benefit from PT services to progress toward functional mobility goals, continue to recommend HHPT if pt does not [facility] to [facility].     If plan is discharge home, recommend the following: A little help with walking and/or transfers;A little help with bathing/dressing/bathroom;Assistance with cooking/housework;Assistance with feeding;Direct supervision/assist for medications management;Direct supervision/assist for financial management;Assist for transportation;Help with stairs or ramp for entrance;Supervision due to cognitive status   Can travel by private vehicle        Equipment Recommendations  Rolling walker (2 wheels);BSC/3in1    Recommendations for Other Services       Precautions / Restrictions Precautions Precautions: Fall Recall of Precautions/Restrictions: Intact Restrictions Weight Bearing Restrictions Per Provider Order: No     Mobility  Bed Mobility Overal bed mobility: Needs Assistance Bed Mobility: Supine to Sit     Supine to  sit: Contact guard, HOB elevated, Used rails     General bed mobility comments: increased time and effort to perform but no lift assist needed at trunk.    Transfers Overall transfer level: Needs assistance Equipment used: None, Rolling walker (2 wheels) Transfers: Sit to/from Stand Sit to Stand: Contact guard assist, Min assist           General transfer comment: from EOB>RW, cues not to pull up on RW with RUE (LUE on RW due to LUE weakness) and to push from EOB with RUE, needing minA to rise fully. from chair<>RW, pt CGA with cues for safe UE placement.    Ambulation/Gait Ambulation/Gait assistance: Contact guard assist Gait Distance (Feet): 16 Feet Assistive device: Rolling walker (2 wheels) Gait Pattern/deviations: Step-through pattern, Decreased stride length     Pre-gait activities: standing hip flexion x10 reps with cues for high knees General Gait Details: Improved posture and stability with RW but needs mod safety cues and assist intermittently for RW placement due to multiple lines and pt mild impulsivity.   Stairs             Wheelchair Mobility     Tilt Bed    Modified Rankin (Stroke Patients Only)       Balance Overall balance assessment: Needs assistance Sitting-balance support: No upper extremity supported Sitting balance-Leahy Scale: Good     Standing balance support: No upper extremity supported, During functional activity Standing balance-Leahy Scale: Fair Standing balance comment: poor for dynamic tasks without AD                            Communication Communication Communication: Impaired Factors Affecting Communication: Difficulty expressing self  Cognition  Arousal: Alert Behavior During Therapy: Impulsive, Restless                             Following commands: Impaired Following commands impaired: Follows one step commands with increased time, Only follows one step commands consistently, Follows  multi-step commands inconsistently    Cueing Cueing Techniques: Verbal cues, Gestural cues  Exercises Other Exercises Other Exercises: standing hip flexion x10 reps and heel raises x10 reps (at RW) prior to pt requesting seated break due to c/o fatigue    General Comments General comments (skin integrity, edema, etc.): BP stable with orthostatic assessment; SpO2 WFL on 3L O2 [place]      Pertinent Vitals/Pain Pain Assessment Pain Assessment: Faces Faces Pain Scale: Hurts even more Pain Location: L shoulder Pain Descriptors / Indicators: Aching, Discomfort    Home Living                          Prior Function            PT Goals (current goals can now be found in the care plan section) Acute Rehab PT Goals Patient Stated Goal: To go home PT Goal Formulation: With patient/family Time For Goal Achievement: 03/13/24 Progress towards PT goals: Progressing toward goals    Frequency    Min 2X/week      PT Plan      Co-evaluation              AM-PAC PT "6 Clicks" Mobility   Outcome Measure  Help needed turning from your back to your side while in a flat bed without using bedrails?: None Help needed moving from lying on your back to sitting on the side of a flat bed without using bedrails?: A Little Help needed moving to and from a bed to a chair (including a wheelchair)?: A Little Help needed standing up from a chair using your arms (e.g., wheelchair or bedside chair)?: A Little Help needed to walk in hospital room?: A Little Help needed climbing 3-5 steps with a railing? : Total 6 Click Score: 17    End of Session Equipment Utilized During Treatment: Gait belt;Oxygen Activity Tolerance: Patient tolerated treatment well;Patient limited by fatigue Patient left: in chair;with call bell/phone within reach;with nursing/sitter in room (no alarm in room, sitter in room for pt safety) Nurse Communication: Mobility status PT Visit Diagnosis: Unsteadiness on  feet (R26.81);Muscle weakness (generalized) (M62.81);Other abnormalities of gait and mobility (R26.89)     Time: 2440-1027 PT Time Calculation (min) (ACUTE ONLY): 21 min  Charges:    $Therapeutic Exercise: 8-22 mins PT General Charges $$ ACUTE PT VISIT: 1 Visit                     Dyan Creelman P., PTA Acute Rehabilitation Services Secure Chat Preferred 9a-5:30pm Office: [phone]    Mariel Shope [facility] 03/01/2024, 5:07 PM

## 2024-03-01 NOTE — Significant Event (Signed)
 Patient's nurse notified that patient has been getting very agitated restless.  Initially tried 0.5 mg IV Ativan but patient still restless on exam at bedside patient is confused disoriented.  I reviewed patient's medication labs and notes.  EKG shows QTc of 501 ms.  I ordered 1 dose of Seroquel 50 mg p.o.  [place] Spotted.

## 2024-03-01 NOTE — Discharge Summary (Signed)
 Valerie Davis:[id number] DOB: 10/03/1941 DOA: 02/26/2024  PCP: [name]., MD  Admit date: 02/26/2024  Discharge date: 03/01/2024  Admitted From: [facility]   Disposition:  [facility]   Recommendations for Outpatient Follow-up:   Follow up with PCP in 1-2 weeks  PCP Please obtain BMP/CBC, 2 view CXR in 1week,  (see Discharge instructions)   PCP Please follow up on the following pending results:    Home Health: None   Equipment/Devices: None  Consultations: PCCM, Psych Discharge Condition: Stable    CODE STATUS: Full    Diet Recommendation: Heart Healthy     No chief complaint on file.    Brief history of present illness from the day of admission and additional interim summary    13F with HTN, IDA, hx GIB, CKD IIIB with recent diarrhea for 3 days who was found down on wellness check. EMS gave Narcan. In the ED code stroke called but work-up neg for stroke. Spot EEG neg. Required levophed for hypotension and given 3L IVF. PCCM consulted for admission.                                                                  Hospital Course   Acute metabolic encephalopathy in the setting of intentional drug overdose with suicide attempt - Patient remains with passive death wish.  Initially admitted to ICU, transferred to my care on 03/01/2024, medically stable, seen by psychiatry transferred to [facility].  Continue suicide precautions and sitter.  Rhabdomyolysis  - difficultly improved with IV fluids, trend is stable, recheck in 7 to 10 days.   Acute hypoxic respiratory failure secondary to likely aspiration pneumonia versus CAP Much improved with empiric antibiotics no signs of ongoing aspiration, 3 more days of oral doxycycline stop date 03/04/2024.   History of hypothyroidism P: Continue Synthroid   EMS observed the  patient to have some blood on her face.  Likely traumatic, stable H&H no reoccurrence of the same  GERD.  PPI.  Dyslipidemia.  Resume home statin in 2 weeks once CK has stabilized.  Hypertension.  Blood pressure stable without any medications.   Discharge diagnosis     Principal Problem:   Acute encephalopathy    Discharge instructions    Discharge Instructions     Diet - low sodium heart healthy   Complete by: As directed    Discharge instructions   Complete by: As directed    Follow with Primary MD [name]., MD in 7 days   Get CBC, CMP, CK, TSH -  checked next visit with your primary MD or [facility] MD   Activity: As tolerated with Full fall precautions use walker/cane & assistance as needed  Disposition [facility]  Diet: Heart Healthy    Special Instructions: If you have smoked  or chewed Tobacco  in the last 2 yrs please stop smoking, stop any regular Alcohol  and or any Recreational drug use.  On your next visit with your primary care physician please Get Medicines reviewed and adjusted.  Please request your Prim.MD to go over all Hospital Tests and Procedure/Radiological results at the follow up, please get all Hospital records sent to your Prim MD by signing hospital release before you go home.  If you experience worsening of your admission symptoms, develop shortness of breath, life threatening emergency, suicidal or homicidal thoughts you must seek medical attention immediately by calling 911 or calling your MD immediately  if symptoms less severe.  You Must read complete instructions/literature along with all the possible adverse reactions/side effects for all the Medicines you take and that have been prescribed to you. Take any new Medicines after you have completely understood and accpet all the possible adverse reactions/side effects.   Do not drive when taking Pain medications.  Do not take more than prescribed Pain, Sleep and Anxiety Medications  Wear Seat  belts while driving.   Increase activity slowly   Complete by: As directed        Discharge Medications   Allergies as of 03/01/2024       Reactions   Wellbutrin [bupropion] Anaphylaxis, Swelling, Rash   Ace Inhibitors Cough   Norvasc [amlodipine] Other (See Comments)   Malaise         Medication List     STOP taking these medications    amphetamine-dextroamphetamine 20 MG tablet Commonly known as: ADDERALL   rosuvastatin 10 MG tablet Commonly known as: CRESTOR   valsartan-hydrochlorothiazide 320-25 MG tablet Commonly known as: DIOVAN-HCT       TAKE these medications    ALPRAZolam 0.5 MG tablet Commonly known as: XANAX Take 0.5 mg by mouth every 8 (eight) hours.   cyanocobalamin 1000 MCG/ML injection Commonly known as: VITAMIN B12 Inject 1,000 mcg into the muscle every 30 (thirty) days.   doxycycline 100 MG tablet Commonly known as: VIBRA-TABS Take 1 tablet (100 mg total) by mouth every 12 (twelve) hours for 2 days.   DULoxetine 60 MG capsule Commonly known as: CYMBALTA Take 60 mg by mouth daily.   levothyroxine 88 MCG tablet Commonly known as: SYNTHROID Take 88 mcg by mouth daily.   pantoprazole 40 MG tablet Commonly known as: PROTONIX Take 40 mg by mouth 2 (two) times daily.         Follow-up Information     Cleatis Polka., MD. Schedule an appointment as soon as possible for a visit in 1 week(s).   Specialty: Internal Medicine Contact information: [address] [place] [address] [phone]                 Major procedures and Radiology Reports - PLEASE review detailed and final reports thoroughly  -     DG Chest [facility] 1 View Result Date: 03/01/2024 CLINICAL DATA:  Shortness of breath EXAM: PORTABLE CHEST 1 VIEW COMPARISON:  Three days ago FINDINGS: Stable heart size and mediastinal contours. Airspace disease in the left lung with mild improvement. Small volume pleural fluid is possible on both sides. No  pneumothorax. Extensive artifact from EKG leads. IMPRESSION: Mild improvement in left-sided pneumonia. Electronically Signed   By: Tiburcio Pea M.D.   On: 03/01/2024 06:41   DG Shoulder Left Result Date: 02/29/2024 CLINICAL DATA:  Left shoulder pain.  No known injury. EXAM: LEFT SHOULDER - 2+ VIEW COMPARISON:  None Available. FINDINGS: AP and Y-views are submitted. The mineralization and alignment are normal. There is no evidence of acute fracture or dislocation. The joint spaces appear preserved, and there is no significant narrowing of the subacromial space. IMPRESSION: No evidence of acute fracture or dislocation. No significant arthropathic changes. Electronically Signed   By: Carey Bullocks M.D.   On: 02/29/2024 16:45   ECHOCARDIOGRAM COMPLETE Result Date: 02/27/2024    ECHOCARDIOGRAM REPORT   Patient Name:   DUANE TRIAS Simien Date of Exam: 02/27/2024 Medical Rec #:  [id number]    Height:       63.0 in Accession #:    [id number]   Weight:       164.9 lb Date of Birth:  07/21/1941    BSA:          1.781 m Patient Age:    82 years     BP:           100/72 mmHg Patient Gender: F            HR:           86 bpm. Exam Location:  Inpatient Procedure: 2D Echo, Color Doppler and Cardiac Doppler (Both Spectral and Color            Flow Doppler were utilized during procedure). STAT ECHO Indications:    Shock  History:        Patient has no prior history of Echocardiogram examinations.                 Risk Factors:Hypertension, Hypothyroidism and Former Smoker.                 GERD.  Sonographer:    Raeford Razor RDCS Referring Phys: [id number] CHI JANE ELLISON IMPRESSIONS  1. Left ventricular ejection fraction, by estimation, is 60 to 65%. The left ventricle has normal function. The left ventricle has no regional wall motion abnormalities. There is mild concentric left ventricular hypertrophy. Left ventricular diastolic parameters are consistent with Grade I diastolic dysfunction (impaired relaxation).  2. Right ventricular  systolic function is normal. The right ventricular size is normal. Tricuspid regurgitation signal is inadequate for assessing PA pressure.  3. Left atrial size was mildly dilated.  4. The mitral valve is normal in structure. No evidence of mitral valve regurgitation.  5. The aortic valve is normal in structure. There is mild thickening of the aortic valve. Aortic valve regurgitation is trivial. Aortic valve sclerosis is present, with no evidence of aortic valve stenosis.  6. The inferior vena cava is normal in size with greater than 50% respiratory variability, suggesting right atrial pressure of 3 mmHg. FINDINGS  Left Ventricle: Left ventricular ejection fraction, by estimation, is 60 to 65%. The left ventricle has normal function. The left ventricle has no regional wall motion abnormalities. The left ventricular internal cavity size was normal in size. There is  mild concentric left ventricular hypertrophy. Left ventricular diastolic parameters are consistent with Grade I diastolic dysfunction (impaired relaxation). Right Ventricle: The right ventricular size is normal. No increase in right ventricular wall thickness. Right ventricular systolic function is normal. Tricuspid regurgitation signal is inadequate for assessing PA pressure. Left Atrium: Left atrial size was mildly dilated. Right Atrium: Right atrial size was normal in size. Pericardium: There is no evidence of pericardial effusion. Mitral Valve: The mitral valve is normal in structure. No evidence of mitral valve regurgitation. Tricuspid Valve: The tricuspid valve is normal in structure. Tricuspid valve regurgitation is trivial.  Aortic Valve: The aortic valve is normal in structure. There is mild thickening of the aortic valve. Aortic valve regurgitation is trivial. Aortic valve sclerosis is present, with no evidence of aortic valve stenosis. Aortic valve mean gradient measures 5.0 mmHg. Aortic valve peak gradient measures 9.4 mmHg. Aortic valve area,  by VTI measures 2.47 cm. Pulmonic Valve: The pulmonic valve was grossly normal. Pulmonic valve regurgitation is trivial. No evidence of pulmonic stenosis. Aorta: The aortic root and ascending aorta are structurally normal, with no evidence of dilitation. Venous: The inferior vena cava is normal in size with greater than 50% respiratory variability, suggesting right atrial pressure of 3 mmHg. IAS/Shunts: No atrial level shunt detected by color flow Doppler.  LEFT VENTRICLE PLAX 2D LVIDd:         4.50 cm      Diastology LVIDs:         2.90 cm      LV e' medial:    5.77 cm/s LV PW:         1.20 cm      LV E/e' medial:  15.3 LV IVS:        1.20 cm      LV e' lateral:   12.50 cm/s LVOT diam:     2.20 cm      LV E/e' lateral: 7.1 LV SV:         76 LV SV Index:   43 LVOT Area:     3.80 cm  LV Volumes (MOD) LV vol d, MOD A2C: 124.0 ml LV vol d, MOD A4C: 111.0 ml LV vol s, MOD A2C: 46.9 ml LV vol s, MOD A4C: 36.2 ml LV SV MOD A2C:     77.1 ml LV SV MOD A4C:     111.0 ml LV SV MOD BP:      81.1 ml RIGHT VENTRICLE             IVC RV Basal diam:  2.80 cm     IVC diam: 2.40 cm RV S prime:     15.90 cm/s TAPSE (M-mode): 1.7 cm LEFT ATRIUM             Index        RIGHT ATRIUM           Index LA diam:        3.50 cm 1.96 cm/m   RA Area:     16.10 cm LA Vol (A2C):   89.6 ml 50.30 ml/m  RA Volume:   40.80 ml  22.90 ml/m LA Vol (A4C):   71.5 ml 40.14 ml/m LA Biplane Vol: 81.1 ml 45.53 ml/m  AORTIC VALVE AV Area (Vmax):    2.58 cm AV Area (Vmean):   2.53 cm AV Area (VTI):     2.47 cm AV Vmax:           153.00 cm/s AV Vmean:          108.000 cm/s AV VTI:            0.309 m AV Peak Grad:      9.4 mmHg AV Mean Grad:      5.0 mmHg LVOT Vmax:         104.00 cm/s LVOT Vmean:        71.900 cm/s LVOT VTI:          0.201 m LVOT/AV VTI ratio: 0.65  AORTA Ao Root diam: 3.40 cm Ao Asc diam:  3.40 cm MITRAL VALVE MV Area (PHT):  4.68 cm     SHUNTS MV Decel Time: 162 msec     Systemic VTI:  0.20 m MV E velocity: 88.50 cm/s   Systemic  Diam: 2.20 cm MV A velocity: 115.00 cm/s MV E/A ratio:  0.77 Mihai Croitoru MD Electronically signed by Luana Rumple MD Signature Date/Time: 02/27/2024/9:16:14 AM    Final    DG Chest Port 1 View Result Date: 02/27/2024 CLINICAL DATA:  Possible aspiration. EXAM: PORTABLE CHEST 1 VIEW COMPARISON:  02/26/2024 FINDINGS: Previously described airspace disease in the left lung is stable. Right lung remains clear. Cardiopericardial silhouette is at upper limits of normal for size. No acute bony abnormality. Telemetry leads overlie the chest. IMPRESSION: Stable airspace disease in the left lung. Imaging features remain concerning for pneumonia. Electronically Signed   By: Donnal Fusi M.D.   On: 02/27/2024 07:21   DG Chest Portable 1 View Result Date: 02/26/2024 CLINICAL DATA:  Fall. EXAM: PORTABLE CHEST 1 VIEW COMPARISON:  Chest radiograph dated 08/23/2013. FINDINGS: Cardiac silhouette is enlarged. Moderate-to-large hiatal hernia again noted. Hazy opacities in the left mid to lower lung zone. The right lung appears clear. No sizable pleural effusion or pneumothorax. No acute osseous abnormality identified. IMPRESSION: 1. Hazy opacities in the left mid to lower lung zone, may be infectious/inflammatory. 2. Cardiomegaly. 3. Moderate-to-large hiatal hernia, similar to prior. Electronically Signed   By: Mannie Seek M.D.   On: 02/26/2024 15:30   DG Pelvis Portable Result Date: 02/26/2024 CLINICAL DATA:  Fall. EXAM: PORTABLE PELVIS 1-2 VIEWS COMPARISON:  None Available. FINDINGS: There is no evidence of acute pelvic fracture or diastasis. Sacroiliac joints and pubic symphysis are anatomically aligned. Mild degenerative changes of the bilateral hips. Excreted vicarious contrast is noted within the bilateral ureters and bladder. IMPRESSION: No acute osseous abnormality. Electronically Signed   By: Mannie Seek M.D.   On: 02/26/2024 15:25   CT C-SPINE NO CHARGE Result Date: 02/26/2024 CLINICAL DATA:  Found  down.  Unresponsive. EXAM: CT CERVICAL SPINE WITHOUT CONTRAST TECHNIQUE: Multidetector CT imaging of the cervical spine was performed without intravenous contrast. Multiplanar CT image reconstructions were also generated. RADIATION DOSE REDUCTION: This exam was performed according to the departmental dose-optimization program which includes automated exposure control, adjustment of the mA and/or kV according to patient size and/or use of iterative reconstruction technique. COMPARISON:  None Available. FINDINGS: Alignment: Slight degenerative anterolisthesis is present at C4-5. No other significant listhesis is present. Straightening of the normal cervical lordosis is present. Skull base and vertebrae: The craniocervical junction is within normal limits. Soft tissues and spinal canal: No prevertebral fluid or swelling. No visible canal hematoma. Disc levels: Multilevel uncovertebral and facet hypertrophy is present. Moderate uncal scratched at moderate foraminal stenosis is present C5-6 and C6-7. Upper chest: The lung apices are clear. No significant pleural effusion or pneumothorax is present. IMPRESSION: 1. No acute fracture or traumatic subluxation. 2. Multilevel degenerative changes of the cervical spine as described. Electronically Signed   By: Audree Leas M.D.   On: 02/26/2024 14:06   CT ANGIO HEAD NECK W WO CM W PERF (CODE STROKE) Result Date: 02/26/2024 CLINICAL DATA:  Code stroke.  Neuro deficit. EXAM: CT ANGIOGRAPHY HEAD AND NECK CT PERFUSION BRAIN TECHNIQUE: Multidetector CT imaging of the head and neck was performed using the standard protocol during bolus administration of intravenous contrast. Multiplanar CT image reconstructions and MIPs were obtained to evaluate the vascular anatomy. Carotid stenosis measurements (when applicable) are obtained utilizing NASCET criteria, using the distal internal  carotid diameter as the denominator. Multiphase CT imaging of the brain was performed following  IV bolus contrast injection. Subsequent parametric perfusion maps were calculated using RAPID software. RADIATION DOSE REDUCTION: This exam was performed according to the departmental dose-optimization program which includes automated exposure control, adjustment of the mA and/or kV according to patient size and/or use of iterative reconstruction technique. CONTRAST:  100mL OMNIPAQUE IOHEXOL 350 MG/ML SOLN COMPARISON:  None Available. FINDINGS: CTA NECK FINDINGS Aortic arch: A 3 vessel arch configuration is present. No significant atherosclerotic changes are present. Great vessel origins are widely patent. Right carotid system: The right common carotid artery is within normal limits. Bifurcation is unremarkable. Mild tortuosity is present in the cervical right ICA without significant stenosis in the neck. Left carotid system: The left common carotid artery is within normal limits. Bifurcation is unremarkable. Cervical left ICA is mildly tortuous without significant stenosis. It is otherwise within normal limits. Vertebral arteries: The left vertebral artery is dominant vessel. Both vertebral arteries originate from the subclavian arteries without significant stenosis. No significant stenosis is present in either vertebral artery in the neck. Skeleton: The vertebral body heights and alignment are normal. No focal osseous lesions are present. Other neck: The soft tissues of the neck are otherwise unremarkable. Salivary glands are within normal limits. Thyroid is normal. No significant adenopathy is present. No focal mucosal or submucosal lesions are present. Upper chest: Ill-defined airspace opacity is present in the lateral left upper lobe. The lungs are otherwise clear. No significant effusion or pneumothorax is present. Review of the MIP images confirms the above findings CTA HEAD FINDINGS Anterior circulation: The internal carotid arteries are within normal limits from the skull base to the ICA termini. The A1 and  M1 segments are normal. The MCA bifurcations are unremarkable. The ACA and MCA branch vessels are within normal limits. Posterior circulation: The left vertebral artery is dominant. PICA origins are visualized and normal. The vertebrobasilar junction basilar artery are within normal limits. Both posterior cerebral arteries originate from basilar tip. The PCA branches are normal bilaterally. No aneurysm is present. Venous sinuses: The dural sinuses are patent. The straight sinus and deep cerebral veins are intact. Cortical veins are within normal limits. No significant vascular malformation is evident. Anatomic variants: None Review of the MIP images confirms the above findings CT Brain Perfusion Findings: ASPECTS: 10 CBF (<30%) Volume: 0mL Perfusion (Tmax>6.0s) volume: 0mL Mismatch Volume: 0mL IMPRESSION: 1. Normal CTA of the head and neck. No large vessel occlusion or hemodynamically significant stenosis. 2. Normal CT perfusion. 3. Ill-defined airspace opacity in the lateral left upper lobe is nonspecific, but may represent atelectasis or infection. Electronically Signed   By: Audree Leas M.D.   On: 02/26/2024 13:51   CT HEAD CODE STROKE WO CONTRAST Result Date: 02/26/2024 CLINICAL DATA:  Code stroke. Neuro deficit, acute, stroke suspected. EXAM: CT HEAD WITHOUT CONTRAST TECHNIQUE: Contiguous axial images were obtained from the base of the skull through the vertex without intravenous contrast. RADIATION DOSE REDUCTION: This exam was performed according to the departmental dose-optimization program which includes automated exposure control, adjustment of the mA and/or kV according to patient size and/or use of iterative reconstruction technique. COMPARISON:  None Available. FINDINGS: Brain: No acute hemorrhage. Background of mild chronic small-vessel disease. Gray-white differentiation is preserved. No hydrocephalus or extra-axial collection. No mass effect or midline shift. Vascular: No hyperdense  vessel or unexpected calcification. Skull: No calvarial fracture or suspicious bone lesion. Skull base is unremarkable. Sinuses/Orbits: No acute finding.  Other: None. ASPECTS [facility] Stroke Program Early CT Score) - Ganglionic level infarction (caudate, lentiform nuclei, internal capsule, insula, M1-M3 cortex): 7 - Supraganglionic infarction (M4-M6 cortex): 3 Total score (0-10 with 10 being normal): 10 IMPRESSION: 1. No acute intracranial hemorrhage or evidence of evolving large vessel territory infarct. ASPECT score is 10. 2. Background of mild chronic small-vessel disease. Code stroke imaging results were communicated on 02/26/2024 at 1:25 pm to provider Dr. Wilford Corner via secure text paging. Electronically Signed   By: Orvan Falconer M.D.   On: 02/26/2024 13:31    Micro Results    Recent Results (from the past 240 hours)  Blood culture (routine x 2)     Status: None (Preliminary result)   Collection Time: 02/26/24  2:00 PM   Specimen: BLOOD  Result Value Ref Range Status   Specimen Description BLOOD RIGHT ANTECUBITAL  Final   Special Requests   Final    BOTTLES DRAWN AEROBIC AND ANAEROBIC Blood Culture results may not be optimal due to an inadequate volume of blood received in culture bottles   Culture   Final    NO GROWTH 4 DAYS Performed at [facility], [address], [place], [place] [address]    Report Status PENDING  Incomplete  Blood culture (routine x 2)     Status: None (Preliminary result)   Collection Time: 02/26/24  3:35 PM   Specimen: BLOOD LEFT HAND  Result Value Ref Range Status   Specimen Description BLOOD LEFT HAND  Final   Special Requests   Final    BOTTLES DRAWN AEROBIC AND ANAEROBIC Blood Culture results may not be optimal due to an inadequate volume of blood received in culture bottles   Culture   Final    NO GROWTH 4 DAYS Performed at [facility], [address], [place], [place] [address]    Report Status PENDING  Incomplete  MRSA Next Gen by PCR,  Nasal     Status: Abnormal   Collection Time: 02/26/24  6:04 PM   Specimen: Nasal Mucosa; Nasal Swab  Result Value Ref Range Status   MRSA by PCR Next Gen DETECTED (A) NOT DETECTED Final    Comment: RESULT CALLED TO, READ BACK BY AND VERIFIED WITH: RN Lloyd Huger [id number] AT 2010 BY EC (NOTE) The GeneXpert MRSA Assay (FDA approved for NASAL specimens only), is one component of a comprehensive MRSA colonization surveillance program. It is not intended to diagnose MRSA infection nor to guide or monitor treatment for MRSA infections. Test performance is not FDA approved in patients less than 56 years old. Performed at [facility], [address], [place], [place] [address]     Today   Subjective    Valerie Davis today has no headache,no chest abdominal pain,no new weakness tingling or numbness,     Objective   Blood pressure 114/83, pulse 85, temperature 97.9 F (36.6 C), temperature source Oral, resp. rate 18, height 5\' 3"  (1.6 m), weight 74.8 kg, SpO2 95%.   Intake/Output Summary (Last 24 hours) at 03/01/2024 0903 Last data filed at 03/01/2024 0648 Gross per 24 hour  Intake 1558.36 ml  Output 2575 ml  Net -1016.64 ml    Exam  Awake Alert, No new F.N deficits,  flat affect [place].AT,PERRAL Supple Neck,   Symmetrical Chest wall movement, Good air movement bilaterally, CTAB RRR,No Gallops,   +ve B.Sounds, Abd Soft, Non tender,  No Cyanosis, Clubbing or edema    Data Review  Recent Labs  Lab 02/26/24 1345 02/26/24 1350 02/26/24 1535 02/26/24 1732 02/27/24 0334 02/29/24 0022 03/01/24 0410  WBC 3.7*  --   --  5.6 9.9 7.1 5.5  HGB 10.1*   < > 10.6* 11.5* 10.8* 10.3* 9.4*  HCT 31.2*   < > 33.2* 35.7* 32.1* 30.5* 28.0*  PLT 172  --   --  203 208 174 184  MCV 90.4  --   --  89.5 85.1 87.4 87.0  MCH 29.3  --   --  28.8 28.6 29.5 29.2  MCHC 32.4  --   --  32.2 33.6 33.8 33.6  RDW 13.3  --   --  13.3 13.3 13.4 13.2  LYMPHSABS 0.6*  --   --   --   --   --   --    MONOABS 0.1  --   --   --   --   --   --   EOSABS 0.0  --   --   --   --   --   --   BASOSABS 0.0  --   --   --   --   --   --    < > = values in this interval not displayed.    Recent Labs  Lab 02/26/24 1345 02/26/24 1350 02/26/24 1355 02/26/24 1535 02/26/24 1732 02/26/24 1745 02/26/24 1948 02/27/24 0334 02/27/24 1304 02/27/24 1645 02/28/24 0016 02/28/24 1126 02/29/24 0022 03/01/24 0410  NA 137   < >  --   --  139  --   --  139  --  137 140  --  141 137  K 3.1*   < >  --   --  3.1*  --   --  3.2*  --  3.6 3.8  --  3.7 3.6  CL 101  --   --   --  105  --   --  106  --  103 106  --  107 103  CO2 24  --   --   --  22  --   --  22  --  22 23  --  25 23  ANIONGAP 12  --   --   --  12  --   --  11  --  12 11  --  9 11  GLUCOSE 201*  --   --   --  144*  --   --  110*  --  116* 107*  --  93 90  BUN 22  --   --   --  22  --   --  20  --  13 10  --  10 7*  CREATININE 1.57*  --   --   --  1.36*  --   --  1.11*  --  0.89 0.87  --  0.77 0.84  AST 21  --   --   --   --   --   --   --  206*  --   --   --   --   --   ALT 13  --   --   --   --   --   --   --  67*  --   --   --   --   --   ALKPHOS 54  --   --   --   --   --   --   --  58  --   --   --   --   --  BILITOT 0.6  --   --   --   --   --   --   --  1.0  --   --   --   --   --   ALBUMIN 3.0*  --   --   --   --   --   --   --  2.8*  --   --   --   --   --   PROCALCITON  --   --   --   --   --   --   --   --   --  0.97  --   --   --   --   LATICACIDVEN  --   --  3.2* 2.0*  --  2.0*  --   --  1.4  --   --   --   --   --   INR 1.2  --   --   --   --   --   --   --   --   --   --   --   --   --   TSH  --   --   --   --   --   --  0.705  --   --   --   --   --   --   --   HGBA1C  --   --   --   --  5.5  --   --   --   --   --   --   --   --   --   AMMONIA  --   --   --   --  36*  --   --   --   --   --   --   --   --   --   BNP  --   --   --   --   --   --   --   --   --   --   --   --   --  911.1*  MG  --   --   --   --   --   --   --   1.6*  --   --  2.0 1.6* 2.1  --   PHOS  --   --   --   --   --   --   --  4.0  --   --   --   --   --   --   CALCIUM 8.1*  --   --   --  7.9*  --   --  8.2*  --  8.6* 8.6*  --  8.7* 8.5*   < > = values in this interval not displayed.    Total Time in preparing paper work, data evaluation and todays exam - 35 minutes  Signature  -    Lynnwood Sauer M.D on 03/01/2024 at 9:03 AM   -  To page go to [email or web address]

## 2024-03-01 NOTE — Discharge Instructions (Addendum)
 Follow with Primary MD [name]., MD in 7 days   Get CBC, CMP,  Activity: As tolerated with Full fall precautions use walker/cane & assistance as needed  Disposition home  Diet: Heart Healthy    Special Instructions: If you have smoked or chewed Tobacco  in the last 2 yrs please stop smoking, stop any regular Alcohol   and or any Recreational drug use.  On your next visit with your primary care physician please Get Medicines reviewed and adjusted.  Please request your Prim.MD to go over all Hospital Tests and Procedure/Radiological results at the follow up, please get all Hospital records sent to your Prim MD by signing hospital release before you go home.  If you experience worsening of your admission symptoms, develop shortness of breath, life threatening emergency, suicidal or homicidal thoughts you must seek medical attention immediately by calling 911 or calling your MD immediately  if symptoms less severe.  You Must read complete instructions/literature along with all the possible adverse reactions/side effects for all the Medicines you take and that have been prescribed to you. Take any new Medicines after you have completely understood and accpet all the possible adverse reactions/side effects.   Do not drive when taking Pain medications.  Do not take more than prescribed Pain, Sleep and Anxiety Medications  Wear Seat belts while driving.

## 2024-03-01 NOTE — Care Management Important Message (Signed)
 Important Message  Patient Details  Name: Valerie Davis MRN: [id number] Date of Birth: [birth year]   Important Message Given:  Yes - Medicare IM     Wynonia Hedges 03/01/2024, 3:33 PM

## 2024-03-02 ENCOUNTER — Inpatient Hospital Stay (HOSPITAL_COMMUNITY)

## 2024-03-02 DIAGNOSIS — G9340 Encephalopathy, unspecified: Secondary | ICD-10-CM | POA: Diagnosis not present

## 2024-03-02 LAB — CBC WITH DIFFERENTIAL/PLATELET
Abs Immature Granulocytes: 0.02 10*3/uL (ref 0.00–0.07)
Basophils Absolute: 0 10*3/uL (ref 0.0–0.1)
Basophils Relative: 1 %
Eosinophils Absolute: 0.2 10*3/uL (ref 0.0–0.5)
Eosinophils Relative: 4 %
HCT: 31.6 % — ABNORMAL LOW (ref 36.0–46.0)
Hemoglobin: 10.6 g/dL — ABNORMAL LOW (ref 12.0–15.0)
Immature Granulocytes: 0 %
Lymphocytes Relative: 24 %
Lymphs Abs: 1.1 10*3/uL (ref 0.7–4.0)
MCH: 29.2 pg (ref 26.0–34.0)
MCHC: 33.5 g/dL (ref 30.0–36.0)
MCV: 87.1 fL (ref 80.0–100.0)
Monocytes Absolute: 0.5 10*3/uL (ref 0.1–1.0)
Monocytes Relative: 10 %
Neutro Abs: 2.8 10*3/uL (ref 1.7–7.7)
Neutrophils Relative %: 61 %
Platelets: 199 10*3/uL (ref 150–400)
RBC: 3.63 MIL/uL — ABNORMAL LOW (ref 3.87–5.11)
RDW: 13.2 % (ref 11.5–15.5)
WBC: 4.6 10*3/uL (ref 4.0–10.5)
nRBC: 0 % (ref 0.0–0.2)

## 2024-03-02 LAB — PHOSPHORUS: Phosphorus: 4.5 mg/dL (ref 2.5–4.6)

## 2024-03-02 LAB — MAGNESIUM: Magnesium: 1.7 mg/dL (ref 1.7–2.4)

## 2024-03-02 LAB — BASIC METABOLIC PANEL WITH GFR
Anion gap: 9 (ref 5–15)
BUN: 8 mg/dL (ref 8–23)
CO2: 25 mmol/L (ref 22–32)
Calcium: 9 mg/dL (ref 8.9–10.3)
Chloride: 107 mmol/L (ref 98–111)
Creatinine, Ser: 0.8 mg/dL (ref 0.44–1.00)
GFR, Estimated: >60 mL/min (ref >60)
Glucose, Bld: 104 mg/dL — ABNORMAL HIGH (ref 70–99)
Potassium: 3.6 mmol/L (ref 3.5–5.1)
Sodium: 141 mmol/L (ref 135–145)

## 2024-03-02 LAB — CULTURE, BLOOD (ROUTINE X 2)
Culture: NO GROWTH
Culture: NO GROWTH

## 2024-03-02 LAB — BRAIN NATRIURETIC PEPTIDE: B Natriuretic Peptide: 1394.1 pg/mL — ABNORMAL HIGH (ref 0.0–100.0)

## 2024-03-02 MED ORDER — AMOXICILLIN-POT CLAVULANATE 875-125 MG PO TABS: 1.0000 | ORAL_TABLET | Freq: Two times a day (BID) | ORAL | 0 refills | Status: DC

## 2024-03-02 MED ORDER — AMOXICILLIN-POT CLAVULANATE 875-125 MG PO TABS
1.0000 | ORAL_TABLET | Freq: Two times a day (BID) | ORAL | Status: DC
  Administered 2024-03-02 – 2024-03-04 (×5): 1 via ORAL
  Filled 2024-03-02 (×5): qty 1

## 2024-03-02 MED ORDER — FUROSEMIDE 10 MG/ML IJ SOLN
40.0000 mg | Freq: Once | INTRAMUSCULAR | Status: AC
  Administered 2024-03-02: 40 mg via INTRAVENOUS
  Filled 2024-03-02: qty 4

## 2024-03-02 NOTE — Progress Notes (Signed)
 Physical Therapy Treatment Patient Details Name: Valerie Davis MRN: [id number] DOB: February 11, 1941 Today's Date: 03/02/2024   History of Present Illness The pt is an 83 yo female presenting 4/11 after being found down at home. Pt obtunded with AMS, improved with Flumanzenil. Pt admitted for management of acute encephalopathy due to overdose. PMH includes: arthritis, ADD, HTN, GIB, CKD III, and depression.    PT Comments  Today's session focused on gait training with a RW and without an AD. She required CGA for safety and stability. Pt ambulated ~144ft using RW on RA with SpO2 >90%. She required a seated rest break. Pt ambulated ~53ft without an AD, but intermittent use of handrail in hallway. She desaturated to 83% SpO2, required standing rest break, and cue for PLB technique to recover to >88% SpO2. Distance limited on second gait bout d/t fatigue and pt c/o dizziness. Will continue to follow acutely and advance appropriately.    If plan is discharge home, recommend the following: A little help with walking and/or transfers;A little help with bathing/dressing/bathroom;Assistance with cooking/housework;Assistance with feeding;Direct supervision/assist for medications management;Direct supervision/assist for financial management;Assist for transportation;Help with stairs or ramp for entrance;Supervision due to cognitive status   Can travel by private vehicle        Equipment Recommendations  Rolling walker (2 wheels);BSC/3in1    Recommendations for Other Services       Precautions / Restrictions Precautions Precautions: Fall Recall of Precautions/Restrictions: Intact Restrictions Weight Bearing Restrictions Per Provider Order: No     Mobility  Bed Mobility               General bed mobility comments: Not assessed. Pt greeted in recliner chair and returned there at end of session.    Transfers Overall transfer level: Needs assistance Equipment used: Rolling walker (2 wheels),  None Transfers: Sit to/from Stand Sit to Stand: Contact guard assist           General transfer comment: Pt stood from recliner chair. VC for proper hand positioning using RW. She powered up with CGA. Good eccentric control with sitting.    Ambulation/Gait Ambulation/Gait assistance: Contact guard assist, +2 safety/equipment Gait Distance (Feet): 150 Feet (1x150, seated rest, 1x80) Assistive device: Rolling walker (2 wheels), None Gait Pattern/deviations: Step-through pattern, Decreased stride length Gait velocity: decreased Gait velocity interpretation: <1.8 ft/sec, indicate of risk for recurrent falls   General Gait Details: Pt ambulated with RW using a reciprocal gait pattern, even weight shift, good foot clearence. She was able to navigate within the room/hallway well. Allowed pt seated rest and preformed ambulation again without an AD. She took slow steady steps without arm swing. Pt intermittently used handrail for increased support, no LOB.   Stairs             Wheelchair Mobility     Tilt Bed    Modified Rankin (Stroke Patients Only)       Balance Overall balance assessment: Needs assistance Sitting-balance support: No upper extremity supported Sitting balance-Leahy Scale: Good     Standing balance support: No upper extremity supported, During functional activity Standing balance-Leahy Scale: Fair Standing balance comment: Pt ambulated without an AD and CGA for safety. No overt LOB.                            Communication Communication Communication: Impaired Factors Affecting Communication: Difficulty expressing self  Cognition Arousal: Alert Behavior During Therapy: Flat affect   PT - Cognitive  impairments: No family/caregiver present to determine baseline                         Following commands: Impaired Following commands impaired: Only follows one step commands consistently, Follows one step commands with increased  time    Cueing Cueing Techniques: Verbal cues, Gestural cues  Exercises      General Comments General comments (skin integrity, edema, etc.): Pt greeted on RA. She maintained SpO2 >90% on first gait bout. Provided pt with seated rest and compelted second gait bout, which she desaturated to 83% SpO2, required standing rest break and cues for PLB technique to quickly recovered to >88% SpO2. Pt c/o dizziness towards the end of session, BP stable.      Pertinent Vitals/Pain Pain Assessment Pain Assessment: Faces Faces Pain Scale: Hurts little more Pain Location: LUE Pain Descriptors / Indicators: Discomfort, Tender, Sore Pain Intervention(s): Monitored during session, Repositioned    Home Living                          Prior Function            PT Goals (current goals can now be found in the care plan section) Acute Rehab PT Goals Patient Stated Goal: Get better Progress towards PT goals: Progressing toward goals    Frequency    Min 2X/week      PT Plan      Co-evaluation              AM-PAC PT "6 Clicks" Mobility   Outcome Measure  Help needed turning from your back to your side while in a flat bed without using bedrails?: None Help needed moving from lying on your back to sitting on the side of a flat bed without using bedrails?: A Little Help needed moving to and from a bed to a chair (including a wheelchair)?: A Little Help needed standing up from a chair using your arms (e.g., wheelchair or bedside chair)?: A Little Help needed to walk in hospital room?: A Little Help needed climbing 3-5 steps with a railing? : A Lot 6 Click Score: 18    End of Session Equipment Utilized During Treatment: Gait belt Activity Tolerance: Patient tolerated treatment well;Patient limited by fatigue Patient left: in chair;with call bell/phone within reach;with nursing/sitter in room Nurse Communication: Mobility status PT Visit Diagnosis: Unsteadiness on feet  (R26.81);Muscle weakness (generalized) (M62.81);Other abnormalities of gait and mobility (R26.89)     Time: 1914-7829 PT Time Calculation (min) (ACUTE ONLY): 23 min  Charges:    $Gait Training: 23-37 mins PT General Charges $$ ACUTE PT VISIT: 1 Visit                     Glenford Lanes, PT, DPT Acute Rehabilitation Services Office: [phone] Secure Chat Preferred  Riva Chester 03/02/2024, 4:36 PM

## 2024-03-02 NOTE — Progress Notes (Addendum)
 PROGRESS NOTE    Valerie Davis  ZOX:[id number] DOB: Apr 05, 1941 DOA: 02/26/2024 PCP: Cleatis Polka., MD   No chief complaint on file.   Brief Narrative:   11F with HTN, IDA, hx GIB, CKD IIIB with recent diarrhea for 3 days who was found down on wellness check. EMS gave Narcan. In the ED code stroke called but work-up neg for stroke. Spot EEG neg. Required levophed for hypotension and given 3L IVF. PCCM consulted for admission.   Assessment & Plan:   Principal Problem:   Acute encephalopathy   Acute metabolic encephalopathy in the setting of intentional drug overdose with suicide attempt - Patient initially admitted to ICU, transferred to progressive care 4/15. - Management per psychiatry, she will need admission to inpatient psych facility when she is medically cleared. - Continue with one-to-one sitters for suicide precaution. - It is unclear what substance patient use to commit suicide, daughter was asking if patient can be resumed in her home Cymbalta, I have told her her QTc remains significantly prolonged, and for now we will hold on resuming any potential offending drugs.  Hospital delirium - Patient with some confusion overnight due to hospital delirium, she received low-dose Seroquel for that.  Rhabdomyolysis  - Thankfully her creatinine remained stable, but total CK significantly trending up from 5000 admission to 20,000, but this has been improving with aggressive IV hydration. -Will hold IV fluids as she started to develop some evidence of volume overload.  Acute hypoxic respiratory failure  Left lung base pneumonia, aspiration versus community-acquired pneumonia . - Chest x-ray significant for left lung base opacity, overall improving but she remains on oxygen, this morning still requiring 2 L her oxygen, oxygen saturation down to 85% on room air. - She was encouraged to use incentive spirometry and flutter valve . - She is on doxycycline, will add Augmentin -Wean  oxygen as tolerated  Acute on chronic diastolic CHF -Echo with a preserved EF, grade 1 diastolic dysfunction, she is with some evidence of volume overload with elevated BNP as she is appropriately received IV hydration in the setting of her rhabdomyolysis, as she is high risk, currently CK trending down, and she remains hypoxic so we will give 1 dose Lasix   History of hypothyroidism -Continue Synthroid   GERD.  PPI.   Dyslipidemia.  Resume home statin in 2 weeks once CK has stabilized.   Hypertension.  Blood pressure stable without any medications.   EMS observed the patient to have some blood on her face.  Likely traumatic, stable H&H no reoccurrence of the same   DVT prophylaxis: Lovenox Code Status: Full code Family Communication: Discussed with daughter Ms Maggie Schwalbe phone Disposition: Will need inpatient psych facility placement, hopefully by the end of the day she will be medically cleared for psych admission once her Foley catheter is discontinued, and she is monitored ambulating in the hallway without oxygen.  Status is: Inpatient    Consultants:  PCCM Psychiatry  Subjective:  Patient with some confusion overnight, restless, required Seroquel, this morning denies any complaints, when I asked her she denies any passive or active suicidal thoughts or ideations  Objective: Vitals:   03/02/24 0800 03/02/24 0856 03/02/24 0919 03/02/24 0921  BP: (!) 159/105 (!) 121/92    Pulse: 97 85 97 88  Resp: (!) 36 (!) 21 (!) 23 (!) 26  Temp:  98.1 F (36.7 C)    TempSrc:  Oral    SpO2: 99% 92% (!) 84% (!) 81%  Weight:      Height:        Intake/Output Summary (Last 24 hours) at 03/02/2024 1200 Last data filed at 03/02/2024 1041 Gross per 24 hour  Intake 270 ml  Output 2400 ml  Net -2130 ml   Filed Weights   02/27/24 0500 02/28/24 0303 02/29/24 0209  Weight: 74.8 kg 74.8 kg 74.8 kg    Examination:  Awake Alert, Oriented X 3, frail Symmetrical Chest wall movement,  diminished air entry at the bases RRR,No Gallops,Rubs or new Murmurs, No Parasternal Heave +ve B.Sounds, Abd Soft, No tenderness, No rebound - guarding or rigidity. No Cyanosis, Clubbing or edema, No new Rash or bruise       Data Reviewed: I have personally reviewed following labs and imaging studies  CBC: Recent Labs  Lab 02/26/24 1345 02/26/24 1350 02/26/24 1732 02/27/24 0334 02/29/24 0022 03/01/24 0410 03/02/24 0957  WBC 3.7*  --  5.6 9.9 7.1 5.5 4.6  NEUTROABS 2.9  --   --   --   --   --  2.8  HGB 10.1*   < > 11.5* 10.8* 10.3* 9.4* 10.6*  HCT 31.2*   < > 35.7* 32.1* 30.5* 28.0* 31.6*  MCV 90.4  --  89.5 85.1 87.4 87.0 87.1  PLT 172  --  203 208 174 184 199   < > = values in this interval not displayed.    Basic Metabolic Panel: Recent Labs  Lab 02/27/24 0334 02/27/24 1645 02/28/24 0016 02/28/24 1126 02/29/24 0022 03/01/24 0410  NA 139 137 140  --  141 137  K 3.2* 3.6 3.8  --  3.7 3.6  CL 106 103 106  --  107 103  CO2 22 22 23   --  25 23  GLUCOSE 110* 116* 107*  --  93 90  BUN 20 13 10   --  10 7*  CREATININE 1.11* 0.89 0.87  --  0.77 0.84  CALCIUM 8.2* 8.6* 8.6*  --  8.7* 8.5*  MG 1.6*  --  2.0 1.6* 2.1  --   PHOS 4.0  --   --   --   --   --     GFR: Estimated Creatinine Clearance: 51.8 mL/min (by C-G formula based on SCr of 0.84 mg/dL).  Liver Function Tests: Recent Labs  Lab 02/26/24 1345 02/27/24 1304  AST 21 206*  ALT 13 67*  ALKPHOS 54 58  BILITOT 0.6 1.0  PROT 5.3* 5.5*  ALBUMIN 3.0* 2.8*    CBG: Recent Labs  Lab 02/28/24 1932 02/28/24 2336 02/29/24 0258 02/29/24 0711 02/29/24 1056  GLUCAP 81 87 105* 90 117*     Recent Results (from the past 240 hours)  Blood culture (routine x 2)     Status: None   Collection Time: 02/26/24  2:00 PM   Specimen: BLOOD  Result Value Ref Range Status   Specimen Description BLOOD RIGHT ANTECUBITAL  Final   Special Requests   Final    BOTTLES DRAWN AEROBIC AND ANAEROBIC Blood Culture results  may not be optimal due to an inadequate volume of blood received in culture bottles   Culture   Final    NO GROWTH 5 DAYS Performed at [facility], [address], [place], [place] [address]    Report Status 03/02/2024 FINAL  Final  Blood culture (routine x 2)     Status: None   Collection Time: 02/26/24  3:35 PM   Specimen: BLOOD LEFT HAND  Result Value Ref  Range Status   Specimen Description BLOOD LEFT HAND  Final   Special Requests   Final    BOTTLES DRAWN AEROBIC AND ANAEROBIC Blood Culture results may not be optimal due to an inadequate volume of blood received in culture bottles   Culture   Final    NO GROWTH 5 DAYS Performed at [facility], [address], [place], [place] [address]    Report Status 03/02/2024 FINAL  Final  MRSA Next Gen by PCR, Nasal     Status: Abnormal   Collection Time: 02/26/24  6:04 PM   Specimen: Nasal Mucosa; Nasal Swab  Result Value Ref Range Status   MRSA by PCR Next Gen DETECTED (A) NOT DETECTED Final    Comment: RESULT CALLED TO, READ BACK BY AND VERIFIED WITH: RN Lloyd Huger [id number] AT 2010 BY EC (NOTE) The GeneXpert MRSA Assay (FDA approved for NASAL specimens only), is one component of a comprehensive MRSA colonization surveillance program. It is not intended to diagnose MRSA infection nor to guide or monitor treatment for MRSA infections. Test performance is not FDA approved in patients less than 65 years old. Performed at [facility], [address], [place], [place] [address]          Radiology Studies: DG Chest Port 1 View Result Date: 03/02/2024 CLINICAL DATA:  Hypoxia. EXAM: PORTABLE CHEST 1 VIEW COMPARISON:  Chest radiograph dated 03/01/2024. FINDINGS: The heart size and mediastinal contours are unchanged. Airspace opacities in the left mid to lower lung zone are mildly improved. Similar possible small bilateral pleural effusions. No pneumothorax. Visualized osseous structures are unchanged. IMPRESSION:  Mildly improved airspace disease in the left mid to lower lung zone. Electronically Signed   By: Hart Robinsons M.D.   On: 03/02/2024 11:52   DG Chest Port 1 View Result Date: 03/01/2024 CLINICAL DATA:  Shortness of breath EXAM: PORTABLE CHEST 1 VIEW COMPARISON:  Three days ago FINDINGS: Stable heart size and mediastinal contours. Airspace disease in the left lung with mild improvement. Small volume pleural fluid is possible on both sides. No pneumothorax. Extensive artifact from EKG leads. IMPRESSION: Mild improvement in left-sided pneumonia. Electronically Signed   By: Tiburcio Pea M.D.   On: 03/01/2024 06:41   DG Shoulder Left Result Date: 02/29/2024 CLINICAL DATA:  Left shoulder pain.  No known injury. EXAM: LEFT SHOULDER - 2+ VIEW COMPARISON:  None Available. FINDINGS: AP and Y-views are submitted. The mineralization and alignment are normal. There is no evidence of acute fracture or dislocation. The joint spaces appear preserved, and there is no significant narrowing of the subacromial space. IMPRESSION: No evidence of acute fracture or dislocation. No significant arthropathic changes. Electronically Signed   By: Carey Bullocks M.D.   On: 02/29/2024 16:45        Scheduled Meds:  Chlorhexidine Gluconate Cloth  6 each Topical Daily   doxycycline  100 mg Oral Q12H   enoxaparin (LOVENOX) injection  40 mg Subcutaneous Q24H   levothyroxine  88 mcg Oral Q0600   mupirocin ointment  1 Application Nasal BID   pantoprazole  40 mg Oral BID   Continuous Infusions:   LOS: 5 days    Huey Bienenstock, MD [facility]   To contact the attending provider between 7A-7P or the covering provider during after hours 7P-7A, please log into the web site [email or web address] and access using universal [place] password for that web site. If you do not have the password, please call the hospital operator.  03/02/2024, 12:00 PM

## 2024-03-03 DIAGNOSIS — G9340 Encephalopathy, unspecified: Secondary | ICD-10-CM | POA: Diagnosis not present

## 2024-03-03 LAB — CBC WITH DIFFERENTIAL/PLATELET
Abs Immature Granulocytes: 0.03 10*3/uL (ref 0.00–0.07)
Basophils Absolute: 0 10*3/uL (ref 0.0–0.1)
Basophils Relative: 1 %
Eosinophils Absolute: 0.3 10*3/uL (ref 0.0–0.5)
Eosinophils Relative: 5 %
HCT: 31.1 % — ABNORMAL LOW (ref 36.0–46.0)
Hemoglobin: 10.3 g/dL — ABNORMAL LOW (ref 12.0–15.0)
Immature Granulocytes: 1 %
Lymphocytes Relative: 22 %
Lymphs Abs: 1.2 10*3/uL (ref 0.7–4.0)
MCH: 28.9 pg (ref 26.0–34.0)
MCHC: 33.1 g/dL (ref 30.0–36.0)
MCV: 87.1 fL (ref 80.0–100.0)
Monocytes Absolute: 0.4 10*3/uL (ref 0.1–1.0)
Monocytes Relative: 7 %
Neutro Abs: 3.6 10*3/uL (ref 1.7–7.7)
Neutrophils Relative %: 64 %
Platelets: 218 10*3/uL (ref 150–400)
RBC: 3.57 MIL/uL — ABNORMAL LOW (ref 3.87–5.11)
RDW: 13.2 % (ref 11.5–15.5)
WBC: 5.5 10*3/uL (ref 4.0–10.5)
nRBC: 0 % (ref 0.0–0.2)

## 2024-03-03 LAB — BASIC METABOLIC PANEL WITH GFR
Anion gap: 14 (ref 5–15)
BUN: 9 mg/dL (ref 8–23)
CO2: 26 mmol/L (ref 22–32)
Calcium: 8.5 mg/dL — ABNORMAL LOW (ref 8.9–10.3)
Chloride: 99 mmol/L (ref 98–111)
Creatinine, Ser: 0.87 mg/dL (ref 0.44–1.00)
GFR, Estimated: >60 mL/min (ref >60)
Glucose, Bld: 89 mg/dL (ref 70–99)
Potassium: 2.9 mmol/L — ABNORMAL LOW (ref 3.5–5.1)
Sodium: 139 mmol/L (ref 135–145)

## 2024-03-03 LAB — PHOSPHORUS: Phosphorus: 3.2 mg/dL (ref 2.5–4.6)

## 2024-03-03 LAB — MAGNESIUM: Magnesium: 1.7 mg/dL (ref 1.7–2.4)

## 2024-03-03 LAB — BRAIN NATRIURETIC PEPTIDE: B Natriuretic Peptide: 1364.2 pg/mL — ABNORMAL HIGH (ref 0.0–100.0)

## 2024-03-03 MED ORDER — POTASSIUM CHLORIDE CRYS ER 20 MEQ PO TBCR
60.0000 meq | EXTENDED_RELEASE_TABLET | Freq: Four times a day (QID) | ORAL | Status: AC
  Administered 2024-03-03 (×2): 60 meq via ORAL
  Filled 2024-03-03 (×2): qty 3

## 2024-03-03 MED ORDER — POTASSIUM CHLORIDE CRYS ER 20 MEQ PO TBCR
40.0000 meq | EXTENDED_RELEASE_TABLET | Freq: Once | ORAL | Status: AC
  Administered 2024-03-03: 40 meq via ORAL
  Filled 2024-03-03: qty 2

## 2024-03-03 MED ORDER — POTASSIUM CHLORIDE CRYS ER 20 MEQ PO TBCR: 40.0000 meq | EXTENDED_RELEASE_TABLET | Freq: Four times a day (QID) | ORAL | Status: DC

## 2024-03-03 MED ORDER — MAGNESIUM SULFATE 2 GM/50ML IV SOLN
2.0000 g | Freq: Once | INTRAVENOUS | Status: AC
Start: 2024-03-03 — End: 2024-03-03
  Administered 2024-03-03: 2 g via INTRAVENOUS
  Filled 2024-03-03: qty 50

## 2024-03-03 MED ORDER — FUROSEMIDE 10 MG/ML IJ SOLN
40.0000 mg | Freq: Once | INTRAMUSCULAR | Status: AC
  Administered 2024-03-03: 40 mg via INTRAVENOUS
  Filled 2024-03-03: qty 4

## 2024-03-03 NOTE — Progress Notes (Signed)
 Occupational Therapy Treatment Patient Details Name: Valerie Davis MRN: [id number] DOB: [birth year] Today's Date: 03/03/2024   History of present illness The pt is an 83 yo female presenting 4/11 after being found down at home. Pt obtunded with AMS, improved with Flumanzenil. Pt admitted for management of acute encephalopathy due to overdose. L shoulder imaging negative for acute abnormalities. PMH includes: arthritis, ADD, HTN, GIB, CKD III, and depression.   OT comments  Pt making incremental progress towards OT goals though limited by reported fatigue after bathing task this AM. Pt with gradual improvements in L shoulder ROM tolerance and able to stand unassisted with RW to ensure proper height. Provided education and handouts for UE HEP and energy conservation for pt to review.       If plan is discharge home, recommend the following:  A little help with walking and/or transfers;A little help with bathing/dressing/bathroom;Assistance with cooking/housework;Direct supervision/assist for medications management;Direct supervision/assist for financial management;Assist for transportation   Equipment Recommendations  Other (comment) (RW)    Recommendations for Other Services      Precautions / Restrictions Precautions Precautions: Fall Restrictions Weight Bearing Restrictions Per Provider Order: No       Mobility Bed Mobility               General bed mobility comments: in recliner on entry    Transfers Overall transfer level: Modified independent Equipment used: Rolling walker (2 wheels) Transfers: Sit to/from Stand Sit to Stand: Modified independent (Device/Increase time)           General transfer comment: Modified Independent to stand from recliner with RW to ensure proper height. Pt declined further mobility at this time     Balance Overall balance assessment: Needs assistance Sitting-balance support: No upper extremity supported Sitting balance-Leahy Scale:  Good     Standing balance support: During functional activity, No upper extremity supported, Bilateral upper extremity supported Standing balance-Leahy Scale: Fair Standing balance comment: able to stand for LB dressing aspects without UE support and without LOB                           ADL either performed or assessed with clinical judgement   ADL Overall ADL's : Needs assistance/impaired Eating/Feeding: Independent;Sitting Eating/Feeding Details (indicate cue type and reason): preparing coffee in recliner                 Lower Body Dressing: Supervision/safety;Sitting/lateral leans;Sit to/from stand Lower Body Dressing Details (indicate cue type and reason): pulling underwear up over bottom in standing, reaching back without LOB               General ADL Comments: Pt reporting fatigue after bathing assist from NT, declined full OT session at this time. Emphasis on ROM exercises for L shoulder, energy conservation (handout provided) and ensured new RW adjusted to pt height. Also provided UE tband exercise w/ permission from charge RN d/t sitter precautions. Educated pt to avoid using LUE for this if sharp pain occurred but encouraged completion with at least RUE    Extremity/Trunk Assessment Upper Extremity Assessment Upper Extremity Assessment: LUE deficits/detail LUE Deficits / Details: reporting pain in L shoulder since admission, imaging negative. guided pt in A/AROM exercises, provided handout w/ education to decrease discomfort/stiffness. flex to 95*, abduct to 95*   Lower Extremity Assessment Lower Extremity Assessment: Defer to PT evaluation        Vision   Vision Assessment?: No apparent visual deficits  Perception     Praxis     Communication Communication Communication: Impaired   Cognition Arousal: Alert Behavior During Therapy: Flat affect Cognition: Cognition impaired     Awareness: Intellectual awareness intact, Online awareness  impaired Memory impairment (select all impairments): Declarative long-term memory, Short-term memory Attention impairment (select first level of impairment): Selective attention, Sustained attention Executive functioning impairment (select all impairments): Sequencing, Reasoning, Problem solving OT - Cognition Comments: memory deficits as pt unable to recall her book getting wet, RW delivery to room. decreased overall online awareness, decreased attention                 Following commands: Impaired Following commands impaired: Only follows one step commands consistently, Follows one step commands with increased time, Follows multi-step commands inconsistently      Cueing   Cueing Techniques: Verbal cues, Gestural cues  Exercises Exercises: Other exercises Other Exercises Other Exercises: A/AROM shoulder flexion, abduction and scapular retraction    Shoulder Instructions       General Comments Safety sitter at bedside    Pertinent Vitals/ Pain       Pain Assessment Pain Assessment: Faces Faces Pain Scale: Hurts little more Pain Location: L shoulder Pain Descriptors / Indicators: Grimacing, Sore Pain Intervention(s): Monitored during session, Limited activity within patient's tolerance  Home Living                                          Prior Functioning/Environment              Frequency  Min 2X/week        Progress Toward Goals  OT Goals(current goals can now be found in the care plan section)  Progress towards OT goals: OT to reassess next treatment  Acute Rehab OT Goals Patient Stated Goal: not feel so tired OT Goal Formulation: With patient Time For Goal Achievement: 03/14/24 Potential to Achieve Goals: Good ADL Goals Pt Will Perform Lower Body Dressing: with set-up;sitting/lateral leans;sit to/from stand Pt Will Transfer to Toilet: with modified independence;ambulating Additional ADL Goal #1: Pt to increase standing activity  tolerance > 10 min during ADLs/mobility without seated rest break Additional ADL Goal #2: Pt to complete functional cognitive test (short blessed test, etc)  Plan      Co-evaluation                 AM-PAC OT "6 Clicks" Daily Activity     Outcome Measure   Help from another person eating meals?: None Help from another person taking care of personal grooming?: A Little Help from another person toileting, which includes using toliet, bedpan, or urinal?: A Little Help from another person bathing (including washing, rinsing, drying)?: A Little Help from another person to put on and taking off regular upper body clothing?: A Little Help from another person to put on and taking off regular lower body clothing?: A Lot 6 Click Score: 18    End of Session Equipment Utilized During Treatment: Rolling walker (2 wheels)  OT Visit Diagnosis: Other abnormalities of gait and mobility (R26.89);Unsteadiness on feet (R26.81);Muscle weakness (generalized) (M62.81);Other symptoms and signs involving cognitive function   Activity Tolerance Patient limited by fatigue   Patient Left in bed;with call bell/phone within reach;with nursing/sitter in room   Nurse Communication Mobility status        Time: 1610-9604 OT Time Calculation (min): 21 min  Charges:  OT General Charges $OT Visit: 1 Visit OT Treatments $Therapeutic Activity: 8-22 mins  Lawrence Pretty, OTR/L Acute Rehab Services Office: [phone]   Annabella Barr 03/03/2024, 9:47 AM

## 2024-03-03 NOTE — Progress Notes (Signed)
 PROGRESS NOTE    Valerie Davis  ZOX:[id number] DOB: Mar 05, 1941 DOA: 02/26/2024 PCP: [name]., MD   No chief complaint on file.   Brief Narrative:   48F with HTN, IDA, hx GIB, CKD IIIB with recent diarrhea for 3 days who was found down on wellness check. EMS gave Narcan. In the ED code stroke called but work-up neg for stroke. Spot EEG neg. Required levophed for hypotension and given 3L IVF. PCCM consulted for admission.  Was admitted to ICU, weaned off Levophed, transferred to progressive care.  Assessment & Plan:   Principal Problem:   Acute encephalopathy   Acute metabolic /toxic encephalopathy in the setting of intentional drug overdose with suicide attempt - Patient initially admitted to ICU, transferred to progressive care 4/15. - Management per psychiatry, she will need admission to inpatient psych facility when she is medically cleared. - Continue with one-to-one sitters for suicide precaution. - It is unclear what substance patient use to commit suicide.   Prolonged QTc - QTc is 569 on admission . - Continue to hold all prolonging agents  - Proved to 501, will repeat EKG today  - Potassium more than 4 and magnesium more than 2  - .aughter was asking if patient can be resumed in her home Cymbalta, I have told her her QTc remains significantly prolonged, and for now we will hold on resuming any potential offending drugs.  Hospital delirium - Patient with some confusion overnight due to hospital delirium, she received low-dose Seroquel for that.  So far no evidence of current delirium, discussed with staff she was encouraged to ambulate with staff in the hallway daily.  Rhabdomyolysis  - Thankfully her creatinine remained stable, but total CK significantly trending up from 5000 admission to 20,000, but this has been improving with aggressive IV hydration. -Will hold IV fluids as she started to develop some evidence of volume overload.  Acute hypoxic respiratory  failure  Left lung base pneumonia, aspiration versus community-acquired pneumonia . - Chest x-ray significant for left lung base opacity, overall improving but she remains on oxygen, this morning still requiring 2 L her oxygen, oxygen saturation down to 85% on room air. - She was encouraged to use incentive spirometry and flutter valve . - Is on doxycycline and Augmentin. - He is weaned off oxygen, no oxygen requirement over last 24 hours - She was encouraged to ambulate with staff  Acute on chronic diastolic CHF -Echo with a preserved EF, grade 1 diastolic dysfunction, she is with some evidence of volume overload with elevated BNP as she is appropriately received IV hydration in the setting of her rhabdomyolysis, as she is high risk, currently CK trending down,. - Continue with diuresis as needed based on renal function and electrolytes, BNP significantly elevated, -Will give IV Lasix today after repeating potassium.   History of hypothyroidism -Continue Synthroid   GERD.  PPI.   Dyslipidemia.  Resume home statin in 2 weeks once CK has stabilized.   Hypertension.  Blood pressure stable without any medications.  Urinary retention - Had Foley catheter insertion, discontinued yesterday with no evidence of further retention   EMS observed the patient to have some blood on her face.  Likely traumatic, stable H&H no reoccurrence of the same   DVT prophylaxis: Lovenox Code Status: Full code Family Communication: Discussed with daughter [name] phone4/16 Disposition: Will need inpatient psych facility placement, patient is medically cleared for discharge to psychiatric facility, no further hypoxia, Foley has been discontinued.  Status is: Inpatient    Consultants:  PCCM Psychiatry  Subjective:  Ambulated in the hallway yesterday with no further hypoxia, had a good night sleep, Foley cath discontinued yesterday with no evidence of retention  Objective: Vitals:   03/02/24 2000  03/03/24 0500 03/03/24 0755 03/03/24 1141  BP: (!) 117/97  125/77 (!) 148/91  Pulse: 92  90 89  Resp: 17  16 (!) 31  Temp:   (!) 97 F (36.1 C) 98 F (36.7 C)  TempSrc:   Oral Oral  SpO2:   92% 92%  Weight:  75 kg    Height:        Intake/Output Summary (Last 24 hours) at 03/03/2024 1207 Last data filed at 03/03/2024 1100 Gross per 24 hour  Intake 600 ml  Output 2801 ml  Net -2201 ml   Filed Weights   02/28/24 0303 02/29/24 0209 03/03/24 0500  Weight: 74.8 kg 74.8 kg 75 kg    Examination:  Awake Alert, Oriented X 3, in no apparent distress Symmetrical Chest wall movement, improved air entry at the bases RRR,No Gallops,Rubs or new Murmurs, No Parasternal Heave +ve B.Sounds, Abd Soft, No tenderness, No rebound - guarding or rigidity. No Cyanosis, Clubbing or edema, No new Rash or bruise        Data Reviewed: I have personally reviewed following labs and imaging studies  CBC: Recent Labs  Lab 02/26/24 1345 02/26/24 1350 02/27/24 0334 02/29/24 0022 03/01/24 0410 03/02/24 0957 03/03/24 0405  WBC 3.7*   < > 9.9 7.1 5.5 4.6 5.5  NEUTROABS 2.9  --   --   --   --  2.8 3.6  HGB 10.1*   < > 10.8* 10.3* 9.4* 10.6* 10.3*  HCT 31.2*   < > 32.1* 30.5* 28.0* 31.6* 31.1*  MCV 90.4   < > 85.1 87.4 87.0 87.1 87.1  PLT 172   < > 208 174 184 199 218   < > = values in this interval not displayed.    Basic Metabolic Panel: Recent Labs  Lab 02/27/24 0334 02/27/24 1645 02/28/24 0016 02/28/24 1126 02/29/24 0022 03/01/24 0410 03/02/24 0957 03/03/24 0405  NA 139   < > 140  --  141 137 141 139  K 3.2*   < > 3.8  --  3.7 3.6 3.6 2.9*  CL 106   < > 106  --  107 103 107 99  CO2 22   < > 23  --  25 23 25 26   GLUCOSE 110*   < > 107*  --  93 90 104* 89  BUN 20   < > 10  --  10 7* 8 9  CREATININE 1.11*   < > 0.87  --  0.77 0.84 0.80 0.87  CALCIUM 8.2*   < > 8.6*  --  8.7* 8.5* 9.0 8.5*  MG 1.6*  --  2.0 1.6* 2.1  --  1.7 1.7  PHOS 4.0  --   --   --   --   --  4.5 3.2   < > =  values in this interval not displayed.    GFR: Estimated Creatinine Clearance: 50 mL/min (by C-G formula based on SCr of 0.87 mg/dL).  Liver Function Tests: Recent Labs  Lab 02/26/24 1345 02/27/24 1304  AST 21 206*  ALT 13 67*  ALKPHOS 54 58  BILITOT 0.6 1.0  PROT 5.3* 5.5*  ALBUMIN 3.0* 2.8*    CBG: Recent Labs  Lab 02/28/24 1932 02/28/24 2336  02/29/24 0258 02/29/24 0711 02/29/24 1056  GLUCAP 81 87 105* 90 117*     Recent Results (from the past 240 hours)  Blood culture (routine x 2)     Status: None   Collection Time: 02/26/24  2:00 PM   Specimen: BLOOD  Result Value Ref Range Status   Specimen Description BLOOD RIGHT ANTECUBITAL  Final   Special Requests   Final    BOTTLES DRAWN AEROBIC AND ANAEROBIC Blood Culture results may not be optimal due to an inadequate volume of blood received in culture bottles   Culture   Final    NO GROWTH 5 DAYS Performed at [facility], [address] [place], [place] [address]    Report Status 03/02/2024 FINAL  Final  Blood culture (routine x 2)     Status: None   Collection Time: 02/26/24  3:35 PM   Specimen: BLOOD LEFT HAND  Result Value Ref Range Status   Specimen Description BLOOD LEFT HAND  Final   Special Requests   Final    BOTTLES DRAWN AEROBIC AND ANAEROBIC Blood Culture results may not be optimal due to an inadequate volume of blood received in culture bottles   Culture   Final    NO GROWTH 5 DAYS Performed at [facility], [address] [place], [place] [address]    Report Status 03/02/2024 FINAL  Final  MRSA Next Gen by PCR, Nasal     Status: Abnormal   Collection Time: 02/26/24  6:04 PM   Specimen: Nasal Mucosa; Nasal Swab  Result Value Ref Range Status   MRSA by PCR Next Gen DETECTED (A) NOT DETECTED Final    Comment: RESULT CALLED TO, READ BACK BY AND VERIFIED WITH: RN ELI GODWIN [id number] AT 2010 BY EC (NOTE) The GeneXpert MRSA Assay (FDA approved for NASAL specimens only), is one  component of a comprehensive MRSA colonization surveillance program. It is not intended to diagnose MRSA infection nor to guide or monitor treatment for MRSA infections. Test performance is not FDA approved in patients less than 40 years old. Performed at [facility], [address], [place], [place] [address]          Radiology Studies: DG Chest Port 1 View Result Date: 03/02/2024 CLINICAL DATA:  Hypoxia. EXAM: PORTABLE CHEST 1 VIEW COMPARISON:  Chest radiograph dated 03/01/2024. FINDINGS: The heart size and mediastinal contours are unchanged. Airspace opacities in the left mid to lower lung zone are mildly improved. Similar possible small bilateral pleural effusions. No pneumothorax. Visualized osseous structures are unchanged. IMPRESSION: Mildly improved airspace disease in the left mid to lower lung zone. Electronically Signed   By: Mannie Seek M.D.   On: 03/02/2024 11:52        Scheduled Meds:  amoxicillin-clavulanate  1 tablet Oral Q12H   Chlorhexidine Gluconate Cloth  6 each Topical Daily   doxycycline  100 mg Oral Q12H   enoxaparin (LOVENOX) injection  40 mg Subcutaneous Q24H   furosemide  40 mg Intravenous Once   levothyroxine  88 mcg Oral Q0600   pantoprazole  40 mg Oral BID   potassium chloride  60 mEq Oral Q6H   Continuous Infusions:   LOS: 6 days    Seena Dadds, MD [facility]   To contact the attending provider between 7A-7P or the covering provider during after hours 7P-7A, please log into the web site [email or web address] and access using universal [facility] password for that web site. If you do  not have the password, please call the hospital operator.  03/03/2024, 12:07 PM

## 2024-03-03 NOTE — Plan of Care (Signed)
  Problem: Education: Goal: Ability to describe self-care measures that may prevent or decrease complications (Diabetes Survival Skills Education) will improve Outcome: Progressing   Problem: Coping: Goal: Ability to adjust to condition or change in health will improve Outcome: Progressing   Problem: Health Behavior/Discharge Planning: Goal: Ability to identify and utilize available resources and services will improve Outcome: Progressing   Problem: Education: Goal: Knowledge of General Education information will improve Description: Including pain rating scale, medication(s)/side effects and non-pharmacologic comfort measures Outcome: Progressing   Problem: Health Behavior/Discharge Planning: Goal: Ability to manage health-related needs will improve Outcome: Progressing   Problem: Activity: Goal: Risk for activity intolerance will decrease Outcome: Progressing   Problem: Coping: Goal: Level of anxiety will decrease Outcome: Progressing

## 2024-03-03 NOTE — TOC Progression Note (Signed)
 Transition of Care [facility]) - Progression Note    Patient Details  Name: Valerie Davis MRN: [id number] Date of Birth: 1941-09-26  Transition of Care [facility]) CM/SW Contact  Jannice Mends, LCSW Phone Number: 03/03/2024, 2:59 PM  Clinical Narrative:    Per MD, patient now medically stable with no oxygen or foley needed. No beds available at [facility]. CSW faxed referral out to:  Destination  Service Provider Address  [facility] [place] [address]  [facility] Josephina Nicks [place] [address]  [facility] [address] [place], [place] [address]  [facility] [address] [place], [place] [address]  [facility] [address] [place], [place] [address]  [facility] [address] [place] [address]  [facility] [address], [place], [place] [address]  [facility] [address], [place] [address]  [place] [facility] [address] [place], [place]  [facility] [address], [place] [address]  [facility] [address] [place] [address]  [facility] [address] Dain Drown [place] [address]  [facility] E[facility] [place]     Expected Discharge Plan: Psychiatric Hospital Barriers to Discharge: Psych Bed not available  Expected Discharge Plan and Services In-house Referral: Clinical Social Work     Living arrangements for the past 2 months: Single Family Home Expected Discharge Date: 03/01/24                                     Social Determinants of Health (SDOH) Interventions SDOH Screenings   Food Insecurity: No Food Insecurity (02/26/2024)  Housing: Low Risk  (02/26/2024)  Transportation Needs: No Transportation Needs (02/26/2024)  Utilities: Not At Risk (02/26/2024)  Social  Connections: Unknown (02/26/2024)  Tobacco Use: Medium Risk (02/26/2024)    Readmission Risk Interventions     No data to display

## 2024-03-04 ENCOUNTER — Other Ambulatory Visit (HOSPITAL_COMMUNITY): Payer: Self-pay

## 2024-03-04 DIAGNOSIS — F411 Generalized anxiety disorder: Secondary | ICD-10-CM

## 2024-03-04 DIAGNOSIS — F322 Major depressive disorder, single episode, severe without psychotic features: Secondary | ICD-10-CM

## 2024-03-04 DIAGNOSIS — T1491XA Suicide attempt, initial encounter: Secondary | ICD-10-CM | POA: Insufficient documentation

## 2024-03-04 DIAGNOSIS — G9340 Encephalopathy, unspecified: Secondary | ICD-10-CM | POA: Diagnosis not present

## 2024-03-04 DIAGNOSIS — M6282 Rhabdomyolysis: Secondary | ICD-10-CM | POA: Insufficient documentation

## 2024-03-04 LAB — CBC WITH DIFFERENTIAL/PLATELET
Abs Immature Granulocytes: 0.05 10*3/uL (ref 0.00–0.07)
Basophils Absolute: 0.1 10*3/uL (ref 0.0–0.1)
Basophils Relative: 1 %
Eosinophils Absolute: 0.3 10*3/uL (ref 0.0–0.5)
Eosinophils Relative: 5 %
HCT: 32.4 % — ABNORMAL LOW (ref 36.0–46.0)
Hemoglobin: 10.6 g/dL — ABNORMAL LOW (ref 12.0–15.0)
Immature Granulocytes: 1 %
Lymphocytes Relative: 20 %
Lymphs Abs: 1.1 10*3/uL (ref 0.7–4.0)
MCH: 28.7 pg (ref 26.0–34.0)
MCHC: 32.7 g/dL (ref 30.0–36.0)
MCV: 87.8 fL (ref 80.0–100.0)
Monocytes Absolute: 0.5 10*3/uL (ref 0.1–1.0)
Monocytes Relative: 9 %
Neutro Abs: 3.7 10*3/uL (ref 1.7–7.7)
Neutrophils Relative %: 64 %
Platelets: 219 10*3/uL (ref 150–400)
RBC: 3.69 MIL/uL — ABNORMAL LOW (ref 3.87–5.11)
RDW: 13.4 % (ref 11.5–15.5)
WBC: 5.7 10*3/uL (ref 4.0–10.5)
nRBC: 0 % (ref 0.0–0.2)

## 2024-03-04 LAB — BRAIN NATRIURETIC PEPTIDE: B Natriuretic Peptide: 969.5 pg/mL — ABNORMAL HIGH (ref 0.0–100.0)

## 2024-03-04 LAB — BASIC METABOLIC PANEL WITH GFR
Anion gap: 11 (ref 5–15)
BUN: 11 mg/dL (ref 8–23)
CO2: 23 mmol/L (ref 22–32)
Calcium: 8.8 mg/dL — ABNORMAL LOW (ref 8.9–10.3)
Chloride: 105 mmol/L (ref 98–111)
Creatinine, Ser: 0.94 mg/dL (ref 0.44–1.00)
GFR, Estimated: >60 mL/min (ref >60)
Glucose, Bld: 98 mg/dL (ref 70–99)
Potassium: 4.3 mmol/L (ref 3.5–5.1)
Sodium: 139 mmol/L (ref 135–145)

## 2024-03-04 LAB — PHOSPHORUS: Phosphorus: 3.2 mg/dL (ref 2.5–4.6)

## 2024-03-04 LAB — MAGNESIUM: Magnesium: 1.9 mg/dL (ref 1.7–2.4)

## 2024-03-04 MED ORDER — FUROSEMIDE 20 MG PO TABS
20.0000 mg | ORAL_TABLET | Freq: Every day | ORAL | 0 refills | Status: AC
  Filled 2024-03-04: qty 30, 30d supply, fill #0

## 2024-03-04 NOTE — Consult Note (Signed)
 [facility] Psychiatric Consult Initial  Patient Name: .Valerie Davis  MRN: [id number]  DOB: Aug 05, 1941  Consult Order details:  Orders (From admission, onward)     Start     Ordered   02/27/24 1439  IP CONSULT TO PSYCHIATRY       Comments: Urgent consult please call Deneise Finlay [phone] or secure chat for details  Ordering Provider: Enrico Hartshorn, NP  Provider:  (Not yet assigned)  Question Answer Comment  Location [facility]   Reason for Consult? Suicide attempt      02/27/24 1445             Mode of Visit: In person    Psychiatry Consult Evaluation  Service Date: March 04, 2024 LOS:  LOS: 7 days  Chief Complaint "I overdosed"  Primary Psychiatric Diagnoses  Major Depression, severe without psychosis 2.  GAD  Assessment  Valerie Davis is a 83 y.o. female admitted: Medicallyfor 02/26/2024 12:35 PM for suicide attempt. She carries the psychiatric diagnoses of Depression and Anxiety and has a past medical history of HTN, IDA, hx GIB, CKD IIIB with recent diarrhea for 3 days who was found down on wellness check. EMS gave Narcan . In the ED code stroke called but work-up neg for stroke. Spot EEG neg. Required levophed  for hypotension and given 3L IVF. PCCM consulted for admission.   Her current presentation of an ongoing depressive symptoms with feelings of hopelesssnes and helplessness that eventually led to an intentional overdose in a suicide attempt is consistent with Major Depressive Disorder. Patient has significant history of depression and anxiety and is currently on Cymbalta . She is currently not being followed by a psychiatrist. Her symptoms have been associated with significant distress, decrease in physical activity possibly due to aging process vs multiple health issues (medical and personal care) , impaired social functioning, and financial difficulty.   02/28/2024 On interview, patient presents alert and oriented x 4. Conversation was  largely logical and goal directed. Her stressors appears to be long term issue. She remains passively suicidal and currently without plan or intent, however her worsening depression and feelings of hopelessness are concerning and placed her at a chronically elevated risk of suicide which may be mitigated by inpatient psychiatric treatment at this time.  She has limited protective factors and may likely make another suicide attempt.  She is not amenable to treatment at this time, and may be discharged home to her daughter with safety plan. We will reassess her for safety once medically cleared.   02/29/2024 Patient seen laying in bed this morning on my approach accompanied by sitter at bedside. She reports that she is currently in the hospital because she made a bad mistake. She admits to taking an overdose of her medication with the intent to take her life. She reports feeling hopelessness and despair regarding her life. She was unable to report any specific stressor that pushed her to this attempt. She does not think  that she needs to be in the hospital and she would like to return home as soon as possible. She reports that she lives nearby her daughter and that will keep her safe.  03/04/2024 Patient seen laying in bed accompanied by sitter at bedside on my approach this afternoon. She continues to report remorse for her suicide attempt earlier this week. She states that she does not want to die and she is focused on being discharged home. She has been in regular contact with her daughter  and they have the plan to live together when the patient is discharged from the hospital. She is also open to following up with outpatient psychiatry. She has been eating and sleeping well and she does not have any complaints currently.  Collateral Maevis Mumby [phone] She has been in regular contact with the patient and she is well aware of her suicide attempt. She believes that the patient made a mistake because  she was overwhelmed and she feels comfortable with the patient being discharged into her care and she will take full responsibility of the patient. She would like the patient to receive outpatient behavioral health resources.  Diagnoses:  Active Hospital problems: Principal Problem:   Acute encephalopathy    Plan   ## Psychiatric Medication Recommendations:  -- None recommended  ## Medical Decision Making Capacity: Not specifically addressed in this encounter  ## Further Work-up:  -- most recent EKG on 4/13 had QtC of 521 -- Pertinent labwork reviewed earlier this admission includes: 4/13- CK- 19,343, creatinine- elevated  LFT,    ## Disposition:-- Per primary team. Patient is cleared from a psychiatric standpoint.  ## Behavioral / Environmental: -Delirium Precautions: Delirium Interventions for Nursing and Staff: - RN to open blinds every AM. - To Bedside: Glasses, hearing aide, and pt's own shoes. Make available to patients. when possible and encourage use. - Encourage po fluids when appropriate, keep fluids within reach. - OOB to chair with meals. - Passive ROM exercises to all extremities with AM & PM care. - RN to assess orientation to person, time and place QAM and PRN. - Recommend extended visitation hours with familiar family/friends as feasible. - Staff to minimize disturbances at night. Turn off television when pt asleep or when not in use.    ## Safety and Observation Level:  - Based on my clinical evaluation, I estimate the patient to be at high risk of self harm in the current setting. - At this time, we recommend  1:1 Observation. This decision is based on my review of the chart including patient's history and current presentation, interview of the patient, mental status examination, and consideration of suicide risk including evaluating suicidal ideation, plan, intent, suicidal or self-harm behaviors, risk factors, and protective factors. This judgment is based on our  ability to directly address suicide risk, implement suicide prevention strategies, and develop a safety plan while the patient is in the clinical setting. Please contact our team if there is a concern that risk level has changed.  CSSR Risk Category:C-SSRS RISK CATEGORY: High Risk  Suicide Risk Assessment: Patient has following modifiable risk factors for suicide: under treated depressionwhich we are addressing by continuing observatin. Patient has following non-modifiable or demographic risk factors for suicide: separation or divorce and history of suicide attempt Patient has the following protective factors against suicide: Supportive family  Thank you for this consult request. Recommendations have been communicated to the primary team.  We will sign off at this time.   Roseline Conine, DO       History of Present Illness  Relevant Aspects of [facility] Course: Valerie Davis is a 83 y.o. female admitted: Medicallyfor 02/26/2024 12:35 PM for suicide attempt. She carries the psychiatric diagnoses of Depression and Anxiety and has a past medical history of HTN, IDA, hx GIB, CKD IIIB with recent diarrhea for 3 days who was found down on wellness check. EMS gave Narcan . In the ED code stroke called but work-up neg for stroke. Spot EEG neg. Required levophed   for hypotension and given 3L IVF. PCCM consulted for admission.  Admitted on 02/26/2024 for medical overdose in a suicide attempt.    Patient Report:  02/29/2024 Patient found in bed with sitter and daughter in the room. Patient was interviewed in the absence of her daughter who wanted to give her privacy. Patient describes her presenting problem as " I overdosed." Patient states that she took multiple unknown medication, including an old prescription of oxycodone . She was unable to quantify. She felt that she was "ready to go, I'm 82 and tired." Notes that she has been having suicidal thought for many months but unable to states what  precipitated thjis particular event. Did not remember speaking to her brother the night before or writing a suicide letter. This is her second suicide attempt. The first one was "many years ago" when she was with her husband. She describes the marriage as "toxic" patient also took medication overdose but did not seek help. Currently describes her mood as "hopeless and totally helpless." She feels disappointed that this suicide attempt was unsuccessful and would like. Currently denies wanting to attempt suicide again because she no longer has medication and she is afraid of being unsuccessful again.   Patient sates that she lives alone without support from anyone and has no social avenue except going to the grocery store for shopping. She feels overwhelmed with day to activities as she is often physically exhausted. Patent aslo mentions that she is experinecing financial difficulties. She gets about 1400 a month, house rent his 900 leaving her with little to cover her utility bills and grocery. She has been divorced for about 15 years and has two adult daughters.   Psych ROS:  Depression: Hopeless and "totally helpless," difficulty maintaining sleep Anxiety:  Feelings of restlessness, constant worry about caring for self Mania (lifetime and current): Denies mood liability Psychosis: (lifetime and current): Denies hallucinations in all modalities  02/29/2024 Collateral information:  Contacted Daughter was present for this collateral, she reported that's she was out of town when when her uncle notified her that he was unable to reach her mother. Lauren stated that her uncle had spoken with mom the night before an she was supposed to call him the next morning. They decided to have a wellness check up on her when they did not hear from her. She reported that her mother suffers from depression. Described her family as "dysfunctional" and they have all been in therapy over there years. She has not spoken to her  sister in 26 years until yesterday when she had to speak with her about their mother.  Daughter noted that when she returned to the house after mother was broughtt to the hospital, she found a detailed letter which appered to be a suicide note. The letter was addressed to her uncle and content of the letter has a detailed instruction on how to pay her outstanding bills, with account information, cash addressed to the landlord attached to the note. Daughter noted that her mother has a DNR and she wanted her mother's wishes to be carried out.   03/04/2024 Patient seen laying in bed accompanied by sitter at bedside on my approach this afternoon. She continues to report remorse for her suicide attempt earlier this week. She states that she does not want to die and she is focused on being discharged home. She has been in regular contact with her daughter and they have the plan to live together when the patient is discharged from the  hospital. She is also open to following up with outpatient psychiatry. She has been eating and sleeping well and she does not have any complaints currently.  Collateral Ciclaly Mulcahey [phone] She has been in regular contact with the patient and she is well aware of her suicide attempt. She believes that the patient made a mistake because she was overwhelmed and she feels comfortable with the patient being discharged into her care and she will take full responsibility of the patient. She would like the patient to receive outpatient behavioral health resources.  ROS   Psychiatric and Social History  Psychiatric History:  Information collected from patient, chart review, and daughter  Prev Dx/Sx: Depression and Anxiety Current Psych Provider: None Home Meds (current): Cymbalta , Xanax , Adderrall Previous Med Trials: unknown Therapy: Over the years, but not currently  Prior Psych Hospitalization: Patient denies  Prior Self Harm: Hx of medication overdose "many years  ago" Prior Violence: denies  Family Psych History: depression with unknown detalis Family Hx suicide: denies  Social History:  Developmental Hx: WNL Educational Hx: COLLEGE Occupational Hx: Retired Nutritional therapist Hx: none Living Situation: Lives alone Spiritual Hx: Believe in God Access to weapons/lethal means: denies owning or having access to a gun.   Substance History Alcohol : vodka, once a week or less  Type of alcohol  -vodka  Last Drink -about a week ago Number of drinks per day- n/a History of alcohol  withdrawal seizures - denies History of DT's - denies Tobacco: denies Illicit drugs: denies Prescription drug abuse: denies Rehab hx: Denies  Exam Findings  Physical Exam:  Vital Signs:  Temp:  [97.6 F (36.4 C)-98.9 F (37.2 C)] 97.6 F (36.4 C) (04/18 1126) Pulse Rate:  [86-95] 86 (04/18 1126) Resp:  [17-23] 20 (04/18 1126) BP: (120-135)/(72-102) 125/72 (04/18 1126) SpO2:  [91 %-95 %] 95 % (04/18 1126) Blood pressure 125/72, pulse 86, temperature 97.6 F (36.4 C), temperature source Oral, resp. rate 20, height 5\' 3"  (1.6 m), weight 75 kg, SpO2 95%. Body mass index is 29.29 kg/m.  Physical Exam  Mental Status Exam: General Appearance: dressed in hospital gown , appears stated age  Orientation:  Full (Time, Place, and Person)  Memory:  Immediate;   Fair  Concentration:  Concentration: Fair  Recall:  Fair  Attention  Fair  Eye Contact:  Good  Speech:  Clear and Coherent  Language:  Good  Volume:  Normal  Mood: 'hopeless"  Affect:  Congruent and Depressed  Thought Process:  Goal Directed and Linear  Thought Content:  Logical  Suicidal Thoughts:   passive "disappointed to be alive"  Homicidal Thoughts:  No  Judgement:  Poor  Insight:  Present  Psychomotor Activity:  Decreased  Akathisia:  No  Fund of Knowledge:  Fair      Assets:  Community education officer  Cognition:  WNL  ADL's:  Intact  AIMS (if indicated):        Other  History   These have been pulled in through the EMR, reviewed, and updated if appropriate.  Family History:  The patient's family history includes Cancer in her sister; Heart attack (age of onset: 42) in her father; Stomach cancer in her maternal grandfather; Stroke in her mother.  Medical History: Past Medical History:  Diagnosis Date   ADD (attention deficit disorder)    Arthritis    Asthma    Blood transfusion without reported diagnosis    Depression    Diverticulosis    GERD (gastroesophageal reflux disease)  H/O: GI bleed    erosive gastritis (NSAID) 2007 in [place]   Hypertension    Hypothyroidism    Insomnia    Iron  deficiency anemia    Osteopenia    Personal history of colonic adenoma 07/06/2013   Pneumonia    Trigeminal neuralgia     hx of    Surgical History: Past Surgical History:  Procedure Laterality Date   ABDOMINAL HYSTERECTOMY  age 32   BILATERAL SALPINGOOPHORECTOMY     COLONOSCOPY     HIATAL HERNIA REPAIR N/A 08/30/2013   Procedure: LAPAROSCOPIC REPAIR OF HIATAL HERNIA;  Surgeon: Azucena Bollard, MD;  Location: [facility];  Service: General;  Laterality: N/A;  With MESH   knee menscectomy Right 07/2009   TOTAL KNEE ARTHROPLASTY Left 11/25/2019   Procedure: TOTAL KNEE ARTHROPLASTY;  Surgeon: Marlena Sima, MD;  Location: [facility];  Service: Orthopedics;  Laterality: Left;   UPPER GASTROINTESTINAL ENDOSCOPY     VAGINAL HYSTERECTOMY       Medications:   Current Facility-Administered Medications:    acetaminophen  (TYLENOL ) tablet 650 mg, 650 mg, Oral, Q6H PRN, Harris, Whitney D, NP, 650 mg at 03/04/24 0336   amoxicillin -clavulanate (AUGMENTIN ) 875-125 MG per tablet 1 tablet, 1 tablet, Oral, Q12H, Elgergawy, Dawood S, MD, 1 tablet at 03/04/24 0859   Chlorhexidine  Gluconate Cloth 2 % PADS 6 each, 6 each, Topical, Daily, Quillian Brunt, MD, 6 each at 03/04/24 0900   clonazePAM  (KLONOPIN ) disintegrating tablet 0.5 mg, 0.5 mg, Oral, BID PRN, Singh, Prashant K, MD,  0.5 mg at 03/03/24 0028   enoxaparin  (LOVENOX ) injection 40 mg, 40 mg, Subcutaneous, Q24H, Harris, Whitney D, NP, 40 mg at 03/03/24 1406   levothyroxine  (SYNTHROID ) tablet 88 mcg, 88 mcg, Oral, Q0600, Deneise Finlay D, NP, 88 mcg at 03/04/24 0506   Oral care mouth rinse, 15 mL, Mouth Rinse, PRN, Quillian Brunt, MD   pantoprazole  (PROTONIX ) EC tablet 40 mg, 40 mg, Oral, BID, Harris, Whitney D, NP, 40 mg at 03/04/24 0859   phenol (CHLORASEPTIC) mouth spray 1 spray, 1 spray, Mouth/Throat, PRN, Deneise Finlay D, NP, 1 spray at 02/29/24 0305  Allergies: Allergies  Allergen Reactions   Wellbutrin [Bupropion] Anaphylaxis, Swelling and Rash   Ace Inhibitors Cough   Norvasc [Amlodipine] Other (See Comments)    Malaise     Roseline Conine, DO

## 2024-03-04 NOTE — Discharge Summary (Signed)
 Physician Discharge Summary  Valerie Davis YTK:[id number] DOB: [id number] DOA: 02/26/2024  PCP: [name]., MD  Admit date: 02/26/2024 Discharge date: 03/04/2024  Admitted From: (Home) Disposition:  (Home)  Recommendations for Outpatient Follow-up:  Follow up with PCP in 1-2 weeks Please obtain BMP/CBC/CK  in one week  Diet recommendation: Heart Healthy / Carb Modified / Regular / Dysphagia   Brief/Interim Summary:  83F with HTN, IDA, hx GIB, CKD IIIB with recent diarrhea for 3 days who was found down on wellness check. EMS gave Narcan . In the ED code stroke called but work-up neg for stroke. Spot EEG neg. Required levophed  for hypotension and given 3L IVF. PCCM consulted for admission.  Was admitted to ICU, weaned off Levophed , transferred to progressive care.      Acute metabolic /toxic encephalopathy in the setting of intentional drug overdose with suicide attempt - Patient initially admitted to ICU, transferred to progressive care 4/15. - Management per psychiatry, initial recommendation has been to admission for psych facility, waiting for medical clearance initially, then bed availability, was on one-to-one safety sitter during hospital stay, patient mood much improved, and current recommendation to discharge home after psychiatry discussed with patient and daughter. - It is unclear what substance patient use to commit suicide.  Hypotensive shock due to drug overdose -Admitted to ICU requiring Levophed , this has resolved.   Prolonged QTc - QTc is 569 on admission .  He was monitored on telemetry, holding all prolonging agents, potassium and magnesium  both has been repleted. Potassium more than 4 and magnesium  more than 2  - daughter was asking if patient can be resumed in her home Cymbalta , I have told her her QTc remains significantly prolonged, and for now we will hold on resuming any potential offending drugs.   Hospital delirium - Patient with some confusion overnight  due to hospital delirium, she received low-dose Seroquel  for that.  This has resolved, she has been doing well over the last couple of days with no evidence of recurrent delirium.   Rhabdomyolysis  - Thankfully her creatinine remained stable, but total CK significantly trending up from 5000 admission to 20,000, but this has been improving with aggressive IV hydration.   Acute hypoxic respiratory failure  Left lung base pneumonia, aspiration versus community-acquired pneumonia . - Chest x-ray significant for left lung base opacity, overall improving but she remains on oxygen, this morning still requiring 2 L her oxygen, oxygen saturation down to 85% on room air.  This all has resolved currently, she has been on room air for last 2 days even with activity. - She was encouraged to use incentive spirometry and flutter valve . - No further antibiotics at time of discharge  Acute on chronic diastolic CHF -Echo with a preserved EF, grade 1 diastolic dysfunction, she is with some evidence of volume overload with elevated BNP as she is appropriately received IV hydration in the setting of her rhabdomyolysis, as she is high risk, currently CK trending down,. - recquired as needed IV Lasix  during hospital stay, she would be discharged on low-dose Lasix  on discharge    History of hypothyroidism -Continue Synthroid    GERD.  PPI.  Hypokalemia - replaced   Dyslipidemia.  Elevated total CK and rhabdo this has been discontinued, can be resumed at a later time when stable   Hypertension.  Blood pressure stable without any medications.  Will hold home meds on discharge   Urinary retention - Had Foley catheter insertion, Foley catheter discontinued 48  hours ago with no evidence of recurrent retention.       EMS observed the patient to have some blood on her face.  Likely traumatic, stable H&H no reoccurrence of the same      Discharge Diagnoses:  Principal Problem:   Acute encephalopathy Active  Problems:   Hypothyroidism   Essential hypertension   Suicide attempt [facility])   Rhabdomyolysis    Discharge Instructions  Discharge Instructions     Diet - low sodium heart healthy   Complete by: As directed    Diet - low sodium heart healthy   Complete by: As directed    Discharge instructions   Complete by: As directed    Follow with Primary MD [name]., MD in 7 days   Get CBC, CMP, CK, TSH -  checked next visit with your primary MD or [facility] MD   Activity: As tolerated with Full fall precautions use walker/cane & assistance as needed  Disposition [facility]  Diet: Heart Healthy    Special Instructions: If you have smoked or chewed Tobacco  in the last 2 yrs please stop smoking, stop any regular Alcohol   and or any Recreational drug use.  On your next visit with your primary care physician please Get Medicines reviewed and adjusted.  Please request your Prim.MD to go over all Hospital Tests and Procedure/Radiological results at the follow up, please get all Hospital records sent to your Prim MD by signing hospital release before you go home.  If you experience worsening of your admission symptoms, develop shortness of breath, life threatening emergency, suicidal or homicidal thoughts you must seek medical attention immediately by calling 911 or calling your MD immediately  if symptoms less severe.  You Must read complete instructions/literature along with all the possible adverse reactions/side effects for all the Medicines you take and that have been prescribed to you. Take any new Medicines after you have completely understood and accpet all the possible adverse reactions/side effects.   Do not drive when taking Pain medications.  Do not take more than prescribed Pain, Sleep and Anxiety Medications  Wear Seat belts while driving.   Discharge instructions   Complete by: As directed    Follow with Primary MD [name]., MD in 7 days   Get CBC, CMP,  Activity:  As tolerated with Full fall precautions use walker/cane & assistance as needed  Disposition home  Diet: Heart Healthy    Special Instructions: If you have smoked or chewed Tobacco  in the last 2 yrs please stop smoking, stop any regular Alcohol   and or any Recreational drug use.  On your next visit with your primary care physician please Get Medicines reviewed and adjusted.  Please request your Prim.MD to go over all Hospital Tests and Procedure/Radiological results at the follow up, please get all Hospital records sent to your Prim MD by signing hospital release before you go home.  If you experience worsening of your admission symptoms, develop shortness of breath, life threatening emergency, suicidal or homicidal thoughts you must seek medical attention immediately by calling 911 or calling your MD immediately  if symptoms less severe.  You Must read complete instructions/literature along with all the possible adverse reactions/side effects for all the Medicines you take and that have been prescribed to you. Take any new Medicines after you have completely understood and accpet all the possible adverse reactions/side effects.   Do not drive when taking Pain medications.  Do not take more  than prescribed Pain, Sleep and Anxiety Medications  Wear Seat belts while driving.   Increase activity slowly   Complete by: As directed    Increase activity slowly   Complete by: As directed       Allergies as of 03/04/2024       Reactions   Wellbutrin [bupropion] Anaphylaxis, Swelling, Rash   Ace Inhibitors Cough   Norvasc [amlodipine] Other (See Comments)   Malaise         Medication List     STOP taking these medications    ALPRAZolam  0.5 MG tablet Commonly known as: XANAX    amphetamine -dextroamphetamine  20 MG tablet Commonly known as: ADDERALL   DULoxetine  60 MG capsule Commonly known as: CYMBALTA    rosuvastatin  10 MG tablet Commonly known as: CRESTOR     valsartan-hydrochlorothiazide 320-25 MG tablet Commonly known as: DIOVAN-HCT       TAKE these medications    cyanocobalamin  1000 MCG/ML injection Commonly known as: VITAMIN B12 Inject 1,000 mcg into the muscle every 30 (thirty) days.   furosemide  20 MG tablet Commonly known as: Lasix  Take 1 tablet (20 mg total) by mouth daily.   levothyroxine  88 MCG tablet Commonly known as: SYNTHROID  Take 88 mcg by mouth daily.   pantoprazole  40 MG tablet Commonly known as: PROTONIX  Take 40 mg by mouth 2 (two) times daily.               Durable Medical Equipment  (From admission, onward)           Start     Ordered   03/01/24 1218  For home use only DME Walker rolling  Once       Question Answer Comment  Walker: With 5 Inch Wheels   Patient needs a walker to treat with the following condition Decreased strength      03/01/24 1218            Follow-up Information     [name]., MD. Schedule an appointment as soon as possible for a visit in 1 week(s).   Specialty: Internal Medicine Contact information: [address] [place] [address] [phone]                Allergies  Allergen Reactions   Wellbutrin [Bupropion] Anaphylaxis, Swelling and Rash   Ace Inhibitors Cough   Norvasc [Amlodipine] Other (See Comments)    Malaise     Consultations: PCCM, psychiatry   Procedures/Studies: DG Chest Port 1 View Result Date: 03/02/2024 CLINICAL DATA:  Hypoxia. EXAM: PORTABLE CHEST 1 VIEW COMPARISON:  Chest radiograph dated 03/01/2024. FINDINGS: The heart size and mediastinal contours are unchanged. Airspace opacities in the left mid to lower lung zone are mildly improved. Similar possible small bilateral pleural effusions. No pneumothorax. Visualized osseous structures are unchanged. IMPRESSION: Mildly improved airspace disease in the left mid to lower lung zone. Electronically Signed   By: Mannie Seek M.D.   On: 03/02/2024 11:52   DG  Chest Port 1 View Result Date: 03/01/2024 CLINICAL DATA:  Shortness of breath EXAM: PORTABLE CHEST 1 VIEW COMPARISON:  Three days ago FINDINGS: Stable heart size and mediastinal contours. Airspace disease in the left lung with mild improvement. Small volume pleural fluid is possible on both sides. No pneumothorax. Extensive artifact from EKG leads. IMPRESSION: Mild improvement in left-sided pneumonia. Electronically Signed   By: Ronnette Coke M.D.   On: 03/01/2024 06:41   DG Shoulder Left Result Date: 02/29/2024 CLINICAL DATA:  Left shoulder pain.  No known  injury. EXAM: LEFT SHOULDER - 2+ VIEW COMPARISON:  None Available. FINDINGS: AP and Y-views are submitted. The mineralization and alignment are normal. There is no evidence of acute fracture or dislocation. The joint spaces appear preserved, and there is no significant narrowing of the subacromial space. IMPRESSION: No evidence of acute fracture or dislocation. No significant arthropathic changes. Electronically Signed   By: Elmon Hagedorn M.D.   On: 02/29/2024 16:45   ECHOCARDIOGRAM COMPLETE Result Date: 02/27/2024    ECHOCARDIOGRAM REPORT   Patient Name:   CHARON SMEDBERG Bonnet Date of Exam: 02/27/2024 Medical Rec #:  [id number]    Height:       63.0 in Accession #:    [id number]   Weight:       164.9 lb Date of Birth:  1941-09-03    BSA:          1.781 m Patient Age:    82 years     BP:           100/72 mmHg Patient Gender: F            HR:           86 bpm. Exam Location:  Inpatient Procedure: 2D Echo, Color Doppler and Cardiac Doppler (Both Spectral and Color            Flow Doppler were utilized during procedure). STAT ECHO Indications:    Shock  History:        Patient has no prior history of Echocardiogram examinations.                 Risk Factors:Hypertension, Hypothyroidism and Former Smoker.                 GERD.  Sonographer:    Travis Friedman RDCS Referring Phys: [id number] CHI JANE ELLISON IMPRESSIONS  1. Left ventricular ejection fraction, by estimation,  is 60 to 65%. The left ventricle has normal function. The left ventricle has no regional wall motion abnormalities. There is mild concentric left ventricular hypertrophy. Left ventricular diastolic parameters are consistent with Grade I diastolic dysfunction (impaired relaxation).  2. Right ventricular systolic function is normal. The right ventricular size is normal. Tricuspid regurgitation signal is inadequate for assessing PA pressure.  3. Left atrial size was mildly dilated.  4. The mitral valve is normal in structure. No evidence of mitral valve regurgitation.  5. The aortic valve is normal in structure. There is mild thickening of the aortic valve. Aortic valve regurgitation is trivial. Aortic valve sclerosis is present, with no evidence of aortic valve stenosis.  6. The inferior vena cava is normal in size with greater than 50% respiratory variability, suggesting right atrial pressure of 3 mmHg. FINDINGS  Left Ventricle: Left ventricular ejection fraction, by estimation, is 60 to 65%. The left ventricle has normal function. The left ventricle has no regional wall motion abnormalities. The left ventricular internal cavity size was normal in size. There is  mild concentric left ventricular hypertrophy. Left ventricular diastolic parameters are consistent with Grade I diastolic dysfunction (impaired relaxation). Right Ventricle: The right ventricular size is normal. No increase in right ventricular wall thickness. Right ventricular systolic function is normal. Tricuspid regurgitation signal is inadequate for assessing PA pressure. Left Atrium: Left atrial size was mildly dilated. Right Atrium: Right atrial size was normal in size. Pericardium: There is no evidence of pericardial effusion. Mitral Valve: The mitral valve is normal in structure. No evidence of mitral valve regurgitation. Tricuspid Valve: The tricuspid valve  is normal in structure. Tricuspid valve regurgitation is trivial. Aortic Valve: The aortic  valve is normal in structure. There is mild thickening of the aortic valve. Aortic valve regurgitation is trivial. Aortic valve sclerosis is present, with no evidence of aortic valve stenosis. Aortic valve mean gradient measures 5.0 mmHg. Aortic valve peak gradient measures 9.4 mmHg. Aortic valve area, by VTI measures 2.47 cm. Pulmonic Valve: The pulmonic valve was grossly normal. Pulmonic valve regurgitation is trivial. No evidence of pulmonic stenosis. Aorta: The aortic root and ascending aorta are structurally normal, with no evidence of dilitation. Venous: The inferior vena cava is normal in size with greater than 50% respiratory variability, suggesting right atrial pressure of 3 mmHg. IAS/Shunts: No atrial level shunt detected by color flow Doppler.  LEFT VENTRICLE PLAX 2D LVIDd:         4.50 cm      Diastology LVIDs:         2.90 cm      LV e' medial:    5.77 cm/s LV PW:         1.20 cm      LV E/e' medial:  15.3 LV IVS:        1.20 cm      LV e' lateral:   12.50 cm/s LVOT diam:     2.20 cm      LV E/e' lateral: 7.1 LV SV:         76 LV SV Index:   43 LVOT Area:     3.80 cm  LV Volumes (MOD) LV vol d, MOD A2C: 124.0 ml LV vol d, MOD A4C: 111.0 ml LV vol s, MOD A2C: 46.9 ml LV vol s, MOD A4C: 36.2 ml LV SV MOD A2C:     77.1 ml LV SV MOD A4C:     111.0 ml LV SV MOD BP:      81.1 ml RIGHT VENTRICLE             IVC RV Basal diam:  2.80 cm     IVC diam: 2.40 cm RV S prime:     15.90 cm/s TAPSE (M-mode): 1.7 cm LEFT ATRIUM             Index        RIGHT ATRIUM           Index LA diam:        3.50 cm 1.96 cm/m   RA Area:     16.10 cm LA Vol (A2C):   89.6 ml 50.30 ml/m  RA Volume:   40.80 ml  22.90 ml/m LA Vol (A4C):   71.5 ml 40.14 ml/m LA Biplane Vol: 81.1 ml 45.53 ml/m  AORTIC VALVE AV Area (Vmax):    2.58 cm AV Area (Vmean):   2.53 cm AV Area (VTI):     2.47 cm AV Vmax:           153.00 cm/s AV Vmean:          108.000 cm/s AV VTI:            0.309 m AV Peak Grad:      9.4 mmHg AV Mean Grad:      5.0  mmHg LVOT Vmax:         104.00 cm/s LVOT Vmean:        71.900 cm/s LVOT VTI:          0.201 m LVOT/AV VTI ratio: 0.65  AORTA Ao Root diam: 3.40 cm Ao  Asc diam:  3.40 cm MITRAL VALVE MV Area (PHT): 4.68 cm     SHUNTS MV Decel Time: 162 msec     Systemic VTI:  0.20 m MV E velocity: 88.50 cm/s   Systemic Diam: 2.20 cm MV A velocity: 115.00 cm/s MV E/A ratio:  0.77 Mihai Croitoru MD Electronically signed by Luana Rumple MD Signature Date/Time: 02/27/2024/9:16:14 AM    Final    DG Chest Port 1 View Result Date: 02/27/2024 CLINICAL DATA:  Possible aspiration. EXAM: PORTABLE CHEST 1 VIEW COMPARISON:  02/26/2024 FINDINGS: Previously described airspace disease in the left lung is stable. Right lung remains clear. Cardiopericardial silhouette is at upper limits of normal for size. No acute bony abnormality. Telemetry leads overlie the chest. IMPRESSION: Stable airspace disease in the left lung. Imaging features remain concerning for pneumonia. Electronically Signed   By: Donnal Fusi M.D.   On: 02/27/2024 07:21   DG Chest Portable 1 View Result Date: 02/26/2024 CLINICAL DATA:  Fall. EXAM: PORTABLE CHEST 1 VIEW COMPARISON:  Chest radiograph dated 08/23/2013. FINDINGS: Cardiac silhouette is enlarged. Moderate-to-large hiatal hernia again noted. Hazy opacities in the left mid to lower lung zone. The right lung appears clear. No sizable pleural effusion or pneumothorax. No acute osseous abnormality identified. IMPRESSION: 1. Hazy opacities in the left mid to lower lung zone, may be infectious/inflammatory. 2. Cardiomegaly. 3. Moderate-to-large hiatal hernia, similar to prior. Electronically Signed   By: Mannie Seek M.D.   On: 02/26/2024 15:30   DG Pelvis Portable Result Date: 02/26/2024 CLINICAL DATA:  Fall. EXAM: PORTABLE PELVIS 1-2 VIEWS COMPARISON:  None Available. FINDINGS: There is no evidence of acute pelvic fracture or diastasis. Sacroiliac joints and pubic symphysis are anatomically aligned. Mild  degenerative changes of the bilateral hips. Excreted vicarious contrast is noted within the bilateral ureters and bladder. IMPRESSION: No acute osseous abnormality. Electronically Signed   By: Mannie Seek M.D.   On: 02/26/2024 15:25   CT C-SPINE NO CHARGE Result Date: 02/26/2024 CLINICAL DATA:  Found down.  Unresponsive. EXAM: CT CERVICAL SPINE WITHOUT CONTRAST TECHNIQUE: Multidetector CT imaging of the cervical spine was performed without intravenous contrast. Multiplanar CT image reconstructions were also generated. RADIATION DOSE REDUCTION: This exam was performed according to the departmental dose-optimization program which includes automated exposure control, adjustment of the mA and/or kV according to patient size and/or use of iterative reconstruction technique. COMPARISON:  None Available. FINDINGS: Alignment: Slight degenerative anterolisthesis is present at C4-5. No other significant listhesis is present. Straightening of the normal cervical lordosis is present. Skull base and vertebrae: The craniocervical junction is within normal limits. Soft tissues and spinal canal: No prevertebral fluid or swelling. No visible canal hematoma. Disc levels: Multilevel uncovertebral and facet hypertrophy is present. Moderate uncal scratched at moderate foraminal stenosis is present C5-6 and C6-7. Upper chest: The lung apices are clear. No significant pleural effusion or pneumothorax is present. IMPRESSION: 1. No acute fracture or traumatic subluxation. 2. Multilevel degenerative changes of the cervical spine as described. Electronically Signed   By: Audree Leas M.D.   On: 02/26/2024 14:06   CT ANGIO HEAD NECK W WO CM W PERF (CODE STROKE) Result Date: 02/26/2024 CLINICAL DATA:  Code stroke.  Neuro deficit. EXAM: CT ANGIOGRAPHY HEAD AND NECK CT PERFUSION BRAIN TECHNIQUE: Multidetector CT imaging of the head and neck was performed using the standard protocol during bolus administration of intravenous  contrast. Multiplanar CT image reconstructions and MIPs were obtained to evaluate the vascular anatomy. Carotid stenosis measurements (when  applicable) are obtained utilizing NASCET criteria, using the distal internal carotid diameter as the denominator. Multiphase CT imaging of the brain was performed following IV bolus contrast injection. Subsequent parametric perfusion maps were calculated using RAPID software. RADIATION DOSE REDUCTION: This exam was performed according to the departmental dose-optimization program which includes automated exposure control, adjustment of the mA and/or kV according to patient size and/or use of iterative reconstruction technique. CONTRAST:  OMNIPAQUE  IOHEXOL  350 MG/ML SOLN COMPARISON:  None Available. FINDINGS: CTA NECK FINDINGS Aortic arch: A 3 vessel arch configuration is present. No significant atherosclerotic changes are present. Great vessel origins are widely patent. Right carotid system: The right common carotid artery is within normal limits. Bifurcation is unremarkable. Mild tortuosity is present in the cervical right ICA without significant stenosis in the neck. Left carotid system: The left common carotid artery is within normal limits. Bifurcation is unremarkable. Cervical left ICA is mildly tortuous without significant stenosis. It is otherwise within normal limits. Vertebral arteries: The left vertebral artery is dominant vessel. Both vertebral arteries originate from the subclavian arteries without significant stenosis. No significant stenosis is present in either vertebral artery in the neck. Skeleton: The vertebral body heights and alignment are normal. No focal osseous lesions are present. Other neck: The soft tissues of the neck are otherwise unremarkable. Salivary glands are within normal limits. Thyroid  is normal. No significant adenopathy is present. No focal mucosal or submucosal lesions are present. Upper chest: Ill-defined airspace opacity is present  in the lateral left upper lobe. The lungs are otherwise clear. No significant effusion or pneumothorax is present. Review of the MIP images confirms the above findings CTA HEAD FINDINGS Anterior circulation: The internal carotid arteries are within normal limits from the skull base to the ICA termini. The A1 and M1 segments are normal. The MCA bifurcations are unremarkable. The ACA and MCA branch vessels are within normal limits. Posterior circulation: The left vertebral artery is dominant. PICA origins are visualized and normal. The vertebrobasilar junction basilar artery are within normal limits. Both posterior cerebral arteries originate from basilar tip. The PCA branches are normal bilaterally. No aneurysm is present. Venous sinuses: The dural sinuses are patent. The straight sinus and deep cerebral veins are intact. Cortical veins are within normal limits. No significant vascular malformation is evident. Anatomic variants: None Review of the MIP images confirms the above findings CT Brain Perfusion Findings: ASPECTS: 10 CBF (<30%) Volume: 0mL Perfusion (Tmax>6.0s) volume: 0mL Mismatch Volume: 0mL IMPRESSION: 1. Normal CTA of the head and neck. No large vessel occlusion or hemodynamically significant stenosis. 2. Normal CT perfusion. 3. Ill-defined airspace opacity in the lateral left upper lobe is nonspecific, but may represent atelectasis or infection. Electronically Signed   By: Audree Leas M.D.   On: 02/26/2024 13:51   CT HEAD CODE STROKE WO CONTRAST Result Date: 02/26/2024 CLINICAL DATA:  Code stroke. Neuro deficit, acute, stroke suspected. EXAM: CT HEAD WITHOUT CONTRAST TECHNIQUE: Contiguous axial images were obtained from the base of the skull through the vertex without intravenous contrast. RADIATION DOSE REDUCTION: This exam was performed according to the departmental dose-optimization program which includes automated exposure control, adjustment of the mA and/or kV according to patient size  and/or use of iterative reconstruction technique. COMPARISON:  None Available. FINDINGS: Brain: No acute hemorrhage. Background of mild chronic small-vessel disease. Gray-white differentiation is preserved. No hydrocephalus or extra-axial collection. No mass effect or midline shift. Vascular: No hyperdense vessel or unexpected calcification. Skull: No calvarial fracture or suspicious bone  lesion. Skull base is unremarkable. Sinuses/Orbits: No acute finding. Other: None. ASPECTS [facility] Stroke Program Early CT Score) - Ganglionic level infarction (caudate, lentiform nuclei, internal capsule, insula, M1-M3 cortex): 7 - Supraganglionic infarction (M4-M6 cortex): 3 Total score (0-10 with 10 being normal): 10 IMPRESSION: 1. No acute intracranial hemorrhage or evidence of evolving large vessel territory infarct. ASPECT score is 10. 2. Background of mild chronic small-vessel disease. Code stroke imaging results were communicated on 02/26/2024 at 1:25 pm to provider Dr. Bonnita Buttner via secure text paging. Electronically Signed   By: Audra Blend M.D.   On: 02/26/2024 13:31   (Echo, Carotid, EGD, Colonoscopy, ERCP)    Subjective: No significant events overnight, she denies any complaints today,  Discharge Exam: Vitals:   03/04/24 0741 03/04/24 1126  BP: 120/74 125/72  Pulse: 90 86  Resp: 17 20  Temp: 97.8 F (36.6 C) 97.6 F (36.4 C)  SpO2: 95% 95%   Vitals:   03/04/24 0400 03/04/24 0505 03/04/24 0741 03/04/24 1126  BP: (!) 130/102 120/80 120/74 125/72  Pulse: 88  90 86  Resp: (!) 22 17 17 20   Temp: 97.9 F (36.6 C)  97.8 F (36.6 C) 97.6 F (36.4 C)  TempSrc: Oral  Oral Oral  SpO2: 91%  95% 95%  Weight:      Height:        General: Pt is alert, awake, not in acute distress, denies any suicidal thoughts or ideations, she appears to be in a good mood, pleasant Cardiovascular: RRR, S1/S2 +, no rubs, no gallops Respiratory: CTA bilaterally, no wheezing, no rhonchi Abdominal: Soft, NT, ND, bowel  sounds + Extremities: no edema, no cyanosis    The results of significant diagnostics from this hospitalization (including imaging, microbiology, ancillary and laboratory) are listed below for reference.     Microbiology: Recent Results (from the past 240 hours)  Blood culture (routine x 2)     Status: None   Collection Time: 02/26/24  2:00 PM   Specimen: BLOOD  Result Value Ref Range Status   Specimen Description BLOOD RIGHT ANTECUBITAL  Final   Special Requests   Final    BOTTLES DRAWN AEROBIC AND ANAEROBIC Blood Culture results may not be optimal due to an inadequate volume of blood received in culture bottles   Culture   Final    NO GROWTH 5 DAYS Performed at [facility], [address] [place], [place] [address]    Report Status 03/02/2024 FINAL  Final  Blood culture (routine x 2)     Status: None   Collection Time: 02/26/24  3:35 PM   Specimen: BLOOD LEFT HAND  Result Value Ref Range Status   Specimen Description BLOOD LEFT HAND  Final   Special Requests   Final    BOTTLES DRAWN AEROBIC AND ANAEROBIC Blood Culture results may not be optimal due to an inadequate volume of blood received in culture bottles   Culture   Final    NO GROWTH 5 DAYS Performed at [facility], [address] [place], [place] [address]    Report Status 03/02/2024 FINAL  Final  MRSA Next Gen by PCR, Nasal     Status: Abnormal   Collection Time: 02/26/24  6:04 PM   Specimen: Nasal Mucosa; Nasal Swab  Result Value Ref Range Status   MRSA by PCR Next Gen DETECTED (A) NOT DETECTED Final    Comment: RESULT CALLED TO, READ BACK BY AND VERIFIED WITH: RN ELI GODWIN [id number] AT  2010 BY EC (NOTE) The GeneXpert MRSA Assay (FDA approved for NASAL specimens only), is one component of a comprehensive MRSA colonization surveillance program. It is not intended to diagnose MRSA infection nor to guide or monitor treatment for MRSA infections. Test performance is not FDA approved in patients less  than 13 years old. Performed at [facility], [address], [place], [place] [address]      Labs: BNP (last 3 results) Recent Labs    03/02/24 0957 03/03/24 0405 03/04/24 0501  BNP 1,394.1* 1,364.2* 969.5*   Basic Metabolic Panel: Recent Labs  Lab 02/27/24 0334 02/27/24 1645 02/28/24 1126 02/29/24 0022 03/01/24 0410 03/02/24 0957 03/03/24 0405 03/04/24 0501  NA 139   < >  --  141 137 141 139 139  K 3.2*   < >  --  3.7 3.6 3.6 2.9* 4.3  CL 106   < >  --  107 103 107 99 105  CO2 22   < >  --  25 23 25 26 23   GLUCOSE 110*   < >  --  93 90 104* 89 98  BUN 20   < >  --  10 7* 8 9 11   CREATININE 1.11*   < >  --  0.77 0.84 0.80 0.87 0.94  CALCIUM  8.2*   < >  --  8.7* 8.5* 9.0 8.5* 8.8*  MG 1.6*   < > 1.6* 2.1  --  1.7 1.7 1.9  PHOS 4.0  --   --   --   --  4.5 3.2 3.2   < > = values in this interval not displayed.   Liver Function Tests: Recent Labs  Lab 02/26/24 1345 02/27/24 1304  AST 21 206*  ALT 13 67*  ALKPHOS 54 58  BILITOT 0.6 1.0  PROT 5.3* 5.5*  ALBUMIN 3.0* 2.8*   No results for input(s): "LIPASE", "AMYLASE" in the last 168 hours. Recent Labs  Lab 02/26/24 1732  AMMONIA 36*   CBC: Recent Labs  Lab 02/26/24 1345 02/26/24 1350 02/29/24 0022 03/01/24 0410 03/02/24 0957 03/03/24 0405 03/04/24 0501  WBC 3.7*   < > 7.1 5.5 4.6 5.5 5.7  NEUTROABS 2.9  --   --   --  2.8 3.6 3.7  HGB 10.1*   < > 10.3* 9.4* 10.6* 10.3* 10.6*  HCT 31.2*   < > 30.5* 28.0* 31.6* 31.1* 32.4*  MCV 90.4   < > 87.4 87.0 87.1 87.1 87.8  PLT 172   < > 174 184 199 218 219   < > = values in this interval not displayed.   Cardiac Enzymes: Recent Labs  Lab 02/27/24 1645 02/28/24 0016 02/28/24 1126 02/28/24 1750 02/29/24 0022 03/01/24 0410  CKTOTAL 17,747*  17,747* 19,343* 12,462* 11,497* 10,422* 6,841*  CKMB 249.2* 168.5* 83.5* 57.7* 43.1*  --    BNP: Invalid input(s): "POCBNP" CBG: Recent Labs  Lab 02/28/24 1932 02/28/24 2336 02/29/24 0258  02/29/24 0711 02/29/24 1056  GLUCAP 81 87 105* 90 117*   D-Dimer No results for input(s): "DDIMER" in the last 72 hours. Hgb A1c No results for input(s): "HGBA1C" in the last 72 hours. Lipid Profile No results for input(s): "CHOL", "HDL", "LDLCALC", "TRIG", "CHOLHDL", "LDLDIRECT" in the last 72 hours. Thyroid  function studies No results for input(s): "TSH", "T4TOTAL", "T3FREE", "THYROIDAB" in the last 72 hours.  Invalid input(s): "FREET3" Anemia work up No results for input(s): "VITAMINB12", "FOLATE", "FERRITIN", "TIBC", "IRON ", "RETICCTPCT" in the last 72 hours. Urinalysis  Component Value Date/Time   COLORURINE YELLOW 02/26/2024 2213   APPEARANCEUR CLEAR 02/26/2024 2213   LABSPEC >1.046 (H) 02/26/2024 2213   PHURINE 5.0 02/26/2024 2213   GLUCOSEU NEGATIVE 02/26/2024 2213   GLUCOSEU NEGATIVE 09/18/2010 1605   HGBUR LARGE (A) 02/26/2024 2213   BILIRUBINUR NEGATIVE 02/26/2024 2213   BILIRUBINUR small 06/19/2012 1238   KETONESUR NEGATIVE 02/26/2024 2213   PROTEINUR 100 (A) 02/26/2024 2213   UROBILINOGEN 0.2 06/19/2012 1238   UROBILINOGEN 0.2 09/18/2010 1605   NITRITE NEGATIVE 02/26/2024 2213   LEUKOCYTESUR NEGATIVE 02/26/2024 2213   Sepsis Labs Recent Labs  Lab 03/01/24 0410 03/02/24 0957 03/03/24 0405 03/04/24 0501  WBC 5.5 4.6 5.5 5.7   Microbiology Recent Results (from the past 240 hours)  Blood culture (routine x 2)     Status: None   Collection Time: 02/26/24  2:00 PM   Specimen: BLOOD  Result Value Ref Range Status   Specimen Description BLOOD RIGHT ANTECUBITAL  Final   Special Requests   Final    BOTTLES DRAWN AEROBIC AND ANAEROBIC Blood Culture results may not be optimal due to an inadequate volume of blood received in culture bottles   Culture   Final    NO GROWTH 5 DAYS Performed at [facility], [address], [place], [place] [address]    Report Status 03/02/2024 FINAL  Final  Blood culture (routine x 2)     Status: None   Collection  Time: 02/26/24  3:35 PM   Specimen: BLOOD LEFT HAND  Result Value Ref Range Status   Specimen Description BLOOD LEFT HAND  Final   Special Requests   Final    BOTTLES DRAWN AEROBIC AND ANAEROBIC Blood Culture results may not be optimal due to an inadequate volume of blood received in culture bottles   Culture   Final    NO GROWTH 5 DAYS Performed at [facility], [address] [place], [place] [address]    Report Status 03/02/2024 FINAL  Final  MRSA Next Gen by PCR, Nasal     Status: Abnormal   Collection Time: 02/26/24  6:04 PM   Specimen: Nasal Mucosa; Nasal Swab  Result Value Ref Range Status   MRSA by PCR Next Gen DETECTED (A) NOT DETECTED Final    Comment: RESULT CALLED TO, READ BACK BY AND VERIFIED WITH: RN ELI GODWIN [id number] AT 2010 BY EC (NOTE) The GeneXpert MRSA Assay (FDA approved for NASAL specimens only), is one component of a comprehensive MRSA colonization surveillance program. It is not intended to diagnose MRSA infection nor to guide or monitor treatment for MRSA infections. Test performance is not FDA approved in patients less than 78 years old. Performed at [facility], [address], [place], [place] [address]      Time coordinating discharge: Over 30 minutes  SIGNED:   Seena Dadds, MD  [facility] 03/04/2024, 1:42 PM Pager   If 7PM-7AM, please contact night-coverage [email or web address] Password [email or web address]

## 2024-03-04 NOTE — Plan of Care (Signed)
  Problem: Education: Goal: Ability to describe self-care measures that may prevent or decrease complications (Diabetes Survival Skills Education) will improve Outcome: Progressing   Problem: Coping: Goal: Ability to adjust to condition or change in h

## 2024-03-04 NOTE — TOC Transition Note (Signed)
 Transition of Care [facility]) - Discharge Note   Patient Details  Name: BOSTYN KUNKLER MRN: [id number] Date of Birth: 03/22/1941  Transition of Care [facility]) CM/SW Contact:  Eusebio High, RN Phone Number: 03/04/2024, 2:15 PM   Clinical Narrative:     Patient will DC to home today in her Daughter's care. Patient will follow up as directed on AVS   Daughter will be here shortly for DC instructions and to transport       Barriers to Discharge: Psych Bed not available   Patient Goals and CMS Choice            Discharge Placement                       Discharge Plan and Services Additional resources added to the After Visit Summary for   In-house Referral: Clinical Social Work                                   Social Drivers of Health (SDOH) Interventions SDOH Screenings   Food Insecurity: No Food Insecurity (02/26/2024)  Housing: Low Risk  (02/26/2024)  Transportation Needs: No Transportation Needs (02/26/2024)  Utilities: Not At Risk (02/26/2024)  Social Connections: Unknown (02/26/2024)  Tobacco Use: Medium Risk (02/26/2024)     Readmission Risk Interventions     No data to display

## 2024-03-04 NOTE — Plan of Care (Signed)

## 2024-03-04 NOTE — TOC Progression Note (Addendum)
 Transition of Care [facility]) - Progression Note    Patient Details  Name: Valerie Davis MRN: [id number] Date of Birth: 1941/05/02  Transition [facility] Care [facility]) CM/SW Contact  Jannice Mends, LCSW Phone Number: 03/04/2024, 8:41 AM  Clinical Narrative:    8:41 AM-Per Rexene Catching, they are not able to accept patient.   No answer at [facility]. Will attempt again.  Left voicemail for [place].    Expected Discharge Plan: [facility] Barriers to Discharge: Psych Bed not available  Expected Discharge Plan and Services In-house Referral: Clinical Social Work     Living arrangements for the past 2 months: Single Family Home Expected Discharge Date: 03/01/24                                     Social Determinants of Health (SDOH) Interventions SDOH Screenings   Food Insecurity: No Food Insecurity (02/26/2024)  Housing: Low Risk  (02/26/2024)  Transportation Needs: No Transportation Needs (02/26/2024)  Utilities: Not At Risk (02/26/2024)  Social Connections: Unknown (02/26/2024)  Tobacco Use: Medium Risk (02/26/2024)    Readmission Risk Interventions     No data to display

## 2024-03-15 DIAGNOSIS — F322 Major depressive disorder, single episode, severe without psychotic features: Secondary | ICD-10-CM | POA: Diagnosis not present

## 2024-03-15 DIAGNOSIS — T796XXD Traumatic ischemia of muscle, subsequent encounter: Secondary | ICD-10-CM | POA: Diagnosis not present

## 2024-03-15 DIAGNOSIS — I5032 Chronic diastolic (congestive) heart failure: Secondary | ICD-10-CM | POA: Diagnosis not present

## 2024-03-15 DIAGNOSIS — Z9151 Personal history of suicidal behavior: Secondary | ICD-10-CM | POA: Diagnosis not present

## 2024-03-15 DIAGNOSIS — D649 Anemia, unspecified: Secondary | ICD-10-CM | POA: Diagnosis not present

## 2024-05-18 DIAGNOSIS — F4323 Adjustment disorder with mixed anxiety and depressed mood: Secondary | ICD-10-CM | POA: Diagnosis not present

## 2024-05-18 DIAGNOSIS — Z5181 Encounter for therapeutic drug level monitoring: Secondary | ICD-10-CM | POA: Diagnosis not present

## 2024-05-19 DIAGNOSIS — F331 Major depressive disorder, recurrent, moderate: Secondary | ICD-10-CM | POA: Diagnosis not present

## 2024-05-19 DIAGNOSIS — F909 Attention-deficit hyperactivity disorder, unspecified type: Secondary | ICD-10-CM | POA: Diagnosis not present

## 2024-05-19 DIAGNOSIS — F419 Anxiety disorder, unspecified: Secondary | ICD-10-CM | POA: Diagnosis not present

## 2024-06-09 DIAGNOSIS — H524 Presbyopia: Secondary | ICD-10-CM | POA: Diagnosis not present

## 2024-06-09 DIAGNOSIS — H26492 Other secondary cataract, left eye: Secondary | ICD-10-CM | POA: Diagnosis not present

## 2024-07-07 DIAGNOSIS — E7849 Other hyperlipidemia: Secondary | ICD-10-CM | POA: Diagnosis not present

## 2024-07-07 DIAGNOSIS — E039 Hypothyroidism, unspecified: Secondary | ICD-10-CM | POA: Diagnosis not present

## 2024-07-07 DIAGNOSIS — E538 Deficiency of other specified B group vitamins: Secondary | ICD-10-CM | POA: Diagnosis not present

## 2024-07-07 DIAGNOSIS — E611 Iron deficiency: Secondary | ICD-10-CM | POA: Diagnosis not present

## 2024-07-14 DIAGNOSIS — R82998 Other abnormal findings in urine: Secondary | ICD-10-CM | POA: Diagnosis not present

## 2024-07-26 DIAGNOSIS — F331 Major depressive disorder, recurrent, moderate: Secondary | ICD-10-CM | POA: Diagnosis not present

## 2024-07-26 DIAGNOSIS — F419 Anxiety disorder, unspecified: Secondary | ICD-10-CM | POA: Diagnosis not present

## 2024-07-26 DIAGNOSIS — F909 Attention-deficit hyperactivity disorder, unspecified type: Secondary | ICD-10-CM | POA: Diagnosis not present

## 2024-08-29 DIAGNOSIS — F419 Anxiety disorder, unspecified: Secondary | ICD-10-CM | POA: Diagnosis not present

## 2024-08-29 DIAGNOSIS — F331 Major depressive disorder, recurrent, moderate: Secondary | ICD-10-CM | POA: Diagnosis not present

## 2024-08-29 DIAGNOSIS — F909 Attention-deficit hyperactivity disorder, unspecified type: Secondary | ICD-10-CM | POA: Diagnosis not present

## 2024-10-06 DIAGNOSIS — F411 Generalized anxiety disorder: Secondary | ICD-10-CM | POA: Diagnosis not present

## 2024-10-06 DIAGNOSIS — F332 Major depressive disorder, recurrent severe without psychotic features: Secondary | ICD-10-CM | POA: Diagnosis not present

## 2024-10-31 DIAGNOSIS — F331 Major depressive disorder, recurrent, moderate: Secondary | ICD-10-CM | POA: Diagnosis not present

## 2024-10-31 DIAGNOSIS — F411 Generalized anxiety disorder: Secondary | ICD-10-CM | POA: Diagnosis not present
